# Patient Record
Sex: Female | Born: 1970
Health system: Southern US, Community
[De-identification: ages and names within clinical notes are randomized; demographics above are authoritative.]

## PROBLEM LIST (undated history)

## (undated) DIAGNOSIS — I10 Essential (primary) hypertension: Secondary | ICD-10-CM

## (undated) DIAGNOSIS — G43909 Migraine, unspecified, not intractable, without status migrainosus: Secondary | ICD-10-CM

## (undated) DIAGNOSIS — R112 Nausea with vomiting, unspecified: Secondary | ICD-10-CM

## (undated) DIAGNOSIS — K760 Fatty (change of) liver, not elsewhere classified: Secondary | ICD-10-CM

## (undated) DIAGNOSIS — Z9889 Other specified postprocedural states: Secondary | ICD-10-CM

## (undated) DIAGNOSIS — K219 Gastro-esophageal reflux disease without esophagitis: Secondary | ICD-10-CM

## (undated) DIAGNOSIS — M6258 Muscle wasting and atrophy, not elsewhere classified, other site: Secondary | ICD-10-CM

## (undated) DIAGNOSIS — F32A Depression, unspecified: Secondary | ICD-10-CM

## (undated) DIAGNOSIS — F419 Anxiety disorder, unspecified: Secondary | ICD-10-CM

## (undated) HISTORY — DX: Migraine, unspecified, not intractable, without status migrainosus: G43.909

## (undated) HISTORY — DX: Anxiety disorder, unspecified: F41.9

## (undated) HISTORY — DX: Essential (primary) hypertension: I10

## (undated) HISTORY — PX: CARDIAC CATHETERIZATION: SHX172

## (undated) HISTORY — DX: Depression, unspecified: F32.A

## (undated) HISTORY — DX: Gastro-esophageal reflux disease without esophagitis: K21.9

---

## 2003-11-10 HISTORY — PX: ANKLE FRACTURE SURGERY: SHX122

## 2006-11-09 HISTORY — PX: GASTRIC BYPASS: SHX52

## 2018-11-11 ENCOUNTER — Other Ambulatory Visit: Payer: Self-pay

## 2018-11-11 DIAGNOSIS — M25532 Pain in left wrist: Secondary | ICD-10-CM | POA: Insufficient documentation

## 2018-11-11 NOTE — ED Triage Notes (Signed)
Pt fell today states unsure why, states has been drinking. Pt co left wrist pain, no other complaints.

## 2018-11-12 ENCOUNTER — Emergency Department: Payer: 59

## 2018-11-12 ENCOUNTER — Emergency Department
Admission: EM | Admit: 2018-11-12 | Discharge: 2018-11-12 | Discharge status: Against Medical Advice | Payer: 59 | Attending: Emergency Medicine | Admitting: Emergency Medicine

## 2018-11-12 ENCOUNTER — Emergency Department
Admission: EM | Admit: 2018-11-12 | Discharge: 2018-11-12 | Disposition: A | Discharge status: Home / Self Care | Payer: 59 | Source: Home / Self Care | Attending: Emergency Medicine | Admitting: Emergency Medicine

## 2018-11-12 ENCOUNTER — Encounter: Payer: Self-pay | Admitting: Emergency Medicine

## 2018-11-12 DIAGNOSIS — Y998 Other external cause status: Secondary | ICD-10-CM

## 2018-11-12 DIAGNOSIS — S52502A Unspecified fracture of the lower end of left radius, initial encounter for closed fracture: Secondary | ICD-10-CM

## 2018-11-12 DIAGNOSIS — W010XXA Fall on same level from slipping, tripping and stumbling without subsequent striking against object, initial encounter: Secondary | ICD-10-CM

## 2018-11-12 DIAGNOSIS — Y9389 Activity, other specified: Secondary | ICD-10-CM

## 2018-11-12 DIAGNOSIS — Y929 Unspecified place or not applicable: Secondary | ICD-10-CM

## 2018-11-12 DIAGNOSIS — S62102A Fracture of unspecified carpal bone, left wrist, initial encounter for closed fracture: Secondary | ICD-10-CM

## 2018-11-12 MED ORDER — TRAMADOL HCL 50 MG PO TABS
50.0000 mg | ORAL_TABLET | Freq: Once | ORAL | Status: AC
  Administered 2018-11-12: 50 mg via ORAL
  Filled 2018-11-12: qty 1

## 2018-11-12 MED ORDER — IBUPROFEN 600 MG PO TABS
600.0000 mg | ORAL_TABLET | Freq: Once | ORAL | Status: AC
  Administered 2018-11-12: 600 mg via ORAL
  Filled 2018-11-12: qty 1

## 2018-11-12 MED ORDER — IBUPROFEN 600 MG PO TABS: 600.0000 mg | ORAL_TABLET | Freq: Three times a day (TID) | ORAL | 0 refills | Status: DC | PRN

## 2018-11-12 MED ORDER — TRAMADOL HCL 50 MG PO TABS: 50.0000 mg | ORAL_TABLET | Freq: Two times a day (BID) | ORAL | 0 refills | Status: DC | PRN

## 2018-11-12 NOTE — ED Notes (Signed)
Patient to stat desk asking about wait time. Patient given update on wait time. Patient verbalizes understanding.

## 2018-11-12 NOTE — ED Triage Notes (Signed)
L wrist pain. Fell yesterday. Came to be seen but had to leave due to long wait.

## 2018-11-12 NOTE — ED Provider Notes (Signed)
[facility] Emergency Department Provider Note   ____________________________________________   First MD Initiated Contact with Patient 11/12/18 1010     (approximate)  I have reviewed the triage vital signs and the nursing notes.   HISTORY  Chief Complaint Wrist Pain    HPI Monica Leon is a 48 y.o. female patient presents with left wrist pain secondary to break and fall yesterday.  Patient came to the ED but left after x-rays without definitive treatment secondary to long wait.  Patient denies loss of sensation.  Patient is right-hand dominant.  Patient rates pain as a 7/10.  Patient described pain is "achy".  No palliative measures prior to today's arrival.  History reviewed. No pertinent past medical history.  There are no active problems to display for this patient.   History reviewed. No pertinent surgical history.  Prior to Admission medications   Medication Sig Start Date End Date Taking? Authorizing Provider  ibuprofen (ADVIL,MOTRIN) 600 MG tablet Take 1 tablet (600 mg total) by mouth every 8 (eight) hours as needed. 11/12/18   Sable Feil, PA-C  traMADol (ULTRAM) 50 MG tablet Take 1 tablet (50 mg total) by mouth every 12 (twelve) hours as needed. 11/12/18   Sable Feil, PA-C    Allergies Patient has no known allergies.  No family history on file.  Social History Social History   Tobacco Use  . Smoking status: Not on file  Substance Use Topics  . Alcohol use: Not on file  . Drug use: Not on file    Review of Systems Constitutional: No fever/chills Eyes: No visual changes. ENT: No sore throat. Cardiovascular: Denies chest pain. Respiratory: Denies shortness of breath. Gastrointestinal: No abdominal pain.  No nausea, no vomiting.  No diarrhea.  No constipation. Genitourinary: Negative for dysuria. Musculoskeletal: Left wrist pain. Skin: Negative for rash. Neurological: Negative for headaches, focal weakness or  numbness.   ____________________________________________   PHYSICAL EXAM:  VITAL SIGNS: ED Triage Vitals  Enc Vitals Group     BP 11/12/18 0955 (!) 107/45     Pulse Rate 11/12/18 0955 89     Resp 11/12/18 0955 18     Temp 11/12/18 0955 98.9 F (37.2 C)     Temp Source 11/12/18 0955 Oral     SpO2 11/12/18 0955 100 %     Weight --      Height --      Head Circumference --      Peak Flow --      Pain Score 11/12/18 0956 7     Pain Loc --      Pain Edu? --      Excl. in [place]? --    Constitutional: Alert and oriented. Well appearing and in no acute distress. Hematological/Lymphatic/Immunilogical: No cervical lymphadenopathy. Cardiovascular: Normal rate, regular rhythm. Grossly normal heart sounds.  Good peripheral circulation. Respiratory: Normal respiratory effort.  No retractions. Lungs CTAB. Musculoskeletal: No obvious deformity to the left wrist.  Mild edema to the distal radius. Neurologic:  Normal speech and language. No gross focal neurologic deficits are appreciated. No gait instability. Skin:  Skin is warm, dry and intact. No rash noted. Psychiatric: Mood and affect are normal. Speech and behavior are normal.  ____________________________________________   LABS (all labs ordered are listed, but only abnormal results are displayed)  Labs Reviewed - No data to display ____________________________________________  EKG   ____________________________________________  RADIOLOGY  ED MD interpretation:    Official radiology report(s): Dg Wrist  Complete Left  Result Date: 11/12/2018 CLINICAL DATA:  Status post fall, with left wrist pain and swelling. Initial encounter. EXAM: LEFT WRIST - COMPLETE 3+ VIEW COMPARISON:  None. FINDINGS: There is a mildly comminuted horizontal fracture through the distal radial metaphysis, with mild dorsal displacement but no significant angulation. The carpal rows appear grossly intact, and demonstrate normal alignment. Diffuse soft tissue  swelling is noted about the wrist. IMPRESSION: Mildly comminuted horizontal fracture through the distal radial metaphysis, with mild dorsal displacement but no significant angulation. Electronically Signed   By: Garald Balding M.D.   On: 11/12/2018 00:31    ____________________________________________   PROCEDURES  Procedure(s) performed: None  .Splint Application Date/Time: 05/13/6432 10:21 AM Performed by: Laban Emperor, NT Authorized by: Sable Feil, PA-C   Consent:    Consent obtained:  Verbal   Consent given by:  Patient   Risks discussed:  Numbness, swelling and pain Pre-procedure details:    Sensation:  Normal Procedure details:    Laterality:  Left   Location:  Wrist   Wrist:  L wrist   Supplies:  Ortho-Glass and cotton padding Post-procedure details:    Pain:  Unchanged   Sensation:  Normal   Patient tolerance of procedure:  Tolerated well, no immediate complications    Critical Care performed: No  ____________________________________________   INITIAL IMPRESSION / ASSESSMENT AND PLAN / ED COURSE  As part of my medical decision making, I reviewed the following data within the electronic MEDICAL RECORD NUMBER    Left wrist pain secondary to distal radius fracture.  Discussed x-ray findings with patient.  Patient placed in a splint.  Patient advised to follow orthopedics by calling for an appointment in 2 days.  Follow discharge care instruction take medication as directed.      ____________________________________________   FINAL CLINICAL IMPRESSION(S) / ED DIAGNOSES  Final diagnoses:  Closed fracture of left wrist, initial encounter     ED Discharge Orders         Ordered    traMADol (ULTRAM) 50 MG tablet  Every 12 hours PRN     11/12/18 1017    ibuprofen (ADVIL,MOTRIN) 600 MG tablet  Every 8 hours PRN     11/12/18 1017           Note:  This document was prepared using Dragon voice recognition software and may include unintentional  dictation errors.    Sable Feil, PA-C 11/12/18 1025    Arta Silence, MD 11/12/18 [phone]

## 2018-11-12 NOTE — Discharge Instructions (Addendum)
Wear wrist splint until evaluation by orthopedics.  Call Monday morning at 830 and advised him to follow-up in the emergency room.

## 2021-04-02 DIAGNOSIS — Z20822 Contact with and (suspected) exposure to covid-19: Secondary | ICD-10-CM | POA: Diagnosis not present

## 2021-06-25 DIAGNOSIS — A084 Viral intestinal infection, unspecified: Secondary | ICD-10-CM | POA: Diagnosis not present

## 2021-06-25 DIAGNOSIS — Z03818 Encounter for observation for suspected exposure to other biological agents ruled out: Secondary | ICD-10-CM | POA: Diagnosis not present

## 2021-08-18 ENCOUNTER — Other Ambulatory Visit: Payer: Self-pay

## 2021-08-18 ENCOUNTER — Encounter: Payer: Self-pay | Admitting: Internal Medicine

## 2021-08-18 ENCOUNTER — Ambulatory Visit (INDEPENDENT_AMBULATORY_CARE_PROVIDER_SITE_OTHER): Payer: BC Managed Care – PPO | Admitting: Internal Medicine

## 2021-08-18 VITALS — BP 91/48 | HR 71 | Temp 97.7°F | Resp 18 | Ht 64.0 in | Wt 225.6 lb

## 2021-08-18 DIAGNOSIS — K219 Gastro-esophageal reflux disease without esophagitis: Secondary | ICD-10-CM

## 2021-08-18 DIAGNOSIS — I10 Essential (primary) hypertension: Secondary | ICD-10-CM | POA: Diagnosis not present

## 2021-08-18 DIAGNOSIS — F419 Anxiety disorder, unspecified: Secondary | ICD-10-CM | POA: Diagnosis not present

## 2021-08-18 DIAGNOSIS — Z23 Encounter for immunization: Secondary | ICD-10-CM

## 2021-08-18 DIAGNOSIS — Z6838 Body mass index (BMI) 38.0-38.9, adult: Secondary | ICD-10-CM

## 2021-08-18 DIAGNOSIS — E6609 Other obesity due to excess calories: Secondary | ICD-10-CM | POA: Diagnosis not present

## 2021-08-18 DIAGNOSIS — F32A Depression, unspecified: Secondary | ICD-10-CM

## 2021-08-18 NOTE — Addendum Note (Signed)
Addended by: Wilson Singer on: 08/18/2021 02:53 PM   Modules accepted: Orders

## 2021-08-18 NOTE — Assessment & Plan Note (Signed)
Encouraged diet and exercise for weight loss ?

## 2021-08-18 NOTE — Assessment & Plan Note (Signed)
Try to avoid foods that trigger your reflux Encouraged weight loss as this can help reduce reflux symptoms Continue Omeprazole

## 2021-08-18 NOTE — Assessment & Plan Note (Signed)
Controlled on Lisinopril Reinforce DASH diet and exercise for weight loss Will monitor

## 2021-08-18 NOTE — Patient Instructions (Signed)
Health Maintenance for Postmenopausal Women Menopause is a normal process in which your ability to get pregnant comes to an end. This process happens slowly over many months or years, usually between the ages of 81 and 79. Menopause is complete when you have missed your menstrual periods for 12 months. It is important to talk with your health care provider about some of the most common conditions that affect women after menopause (postmenopausal women). These include heart disease, cancer, and bone loss (osteoporosis). Adopting a healthy lifestyle and getting preventive care can help to promote your health and wellness. The actions you take can also lower your chances of developing some of these common conditions. What should I know about menopause? During menopause, you may get a number of symptoms, such as: Hot flashes. These can be moderate or severe. Night sweats. Decrease in sex drive. Mood swings. Headaches. Tiredness. Irritability. Memory problems. Insomnia. Choosing to treat or not to treat these symptoms is a decision that you make with your health care provider. Do I need hormone replacement therapy? Hormone replacement therapy is effective in treating symptoms that are caused by menopause, such as hot flashes and night sweats. Hormone replacement carries certain risks, especially as you become older. If you are thinking about using estrogen or estrogen with progestin, discuss the benefits and risks with your health care provider. What is my risk for heart disease and stroke? The risk of heart disease, heart attack, and stroke increases as you age. One of the causes may be a change in the body's hormones during menopause. This can affect how your body uses dietary fats, triglycerides, and cholesterol. Heart attack and stroke are medical emergencies. There are many things that you can do to help prevent heart disease and stroke. Watch your blood pressure High blood pressure causes heart  disease and increases the risk of stroke. This is more likely to develop in people who have high blood pressure readings, are of African descent, or are overweight. Have your blood pressure checked: Every 3-5 years if you are 51-40 years of age. Every year if you are 73 years old or older. Eat a healthy diet  Eat a diet that includes plenty of vegetables, fruits, low-fat dairy products, and lean protein. Do not eat a lot of foods that are high in solid fats, added sugars, or sodium. Get regular exercise Get regular exercise. This is one of the most important things you can do for your health. Most adults should: Try to exercise for at least 150 minutes each week. The exercise should increase your heart rate and make you sweat (moderate-intensity exercise). Try to do strengthening exercises at least twice each week. Do these in addition to the moderate-intensity exercise. Spend less time sitting. Even light physical activity can be beneficial. Other tips Work with your health care provider to achieve or maintain a healthy weight. Do not use any products that contain nicotine or tobacco, such as cigarettes, e-cigarettes, and chewing tobacco. If you need help quitting, ask your health care provider. Know your numbers. Ask your health care provider to check your cholesterol and your blood sugar (glucose). Continue to have your blood tested as directed by your health care provider. Do I need screening for cancer? Depending on your health history and family history, you may need to have cancer screening at different stages of your life. This may include screening for: Breast cancer. Cervical cancer. Lung cancer. Colorectal cancer. What is my risk for osteoporosis? After menopause, you may be  at increased risk for osteoporosis. Osteoporosis is a condition in which bone destruction happens more quickly than new bone creation. To help prevent osteoporosis or the bone fractures that can happen because  of osteoporosis, you may take the following actions: If you are 8-63 years old, get at least 1,000 mg of calcium and at least 600 mg of vitamin D per day. If you are older than age 36 but younger than age 61, get at least 1,200 mg of calcium and at least 600 mg of vitamin D per day. If you are older than age 73, get at least 1,200 mg of calcium and at least 800 mg of vitamin D per day. Smoking and drinking excessive alcohol increase the risk of osteoporosis. Eat foods that are rich in calcium and vitamin D, and do weight-bearing exercises several times each week as directed by your health care provider. How does menopause affect my mental health? Depression may occur at any age, but it is more common as you become older. Common symptoms of depression include: Low or sad mood. Changes in sleep patterns. Changes in appetite or eating patterns. Feeling an overall lack of motivation or enjoyment of activities that you previously enjoyed. Frequent crying spells. Talk with your health care provider if you think that you are experiencing depression. General instructions See your health care provider for regular wellness exams and vaccines. This may include: Scheduling regular health, dental, and eye exams. Getting and maintaining your vaccines. These include: Influenza vaccine. Get this vaccine each year before the flu season begins. Pneumonia vaccine. Shingles vaccine. Tetanus, diphtheria, and pertussis (Tdap) booster vaccine. Your health care provider may also recommend other immunizations. Tell your health care provider if you have ever been abused or do not feel safe at home. Summary Menopause is a normal process in which your ability to get pregnant comes to an end. This condition causes hot flashes, night sweats, decreased interest in sex, mood swings, headaches, or lack of sleep. Treatment for this condition may include hormone replacement therapy. Take actions to keep yourself healthy,  including exercising regularly, eating a healthy diet, watching your weight, and checking your blood pressure and blood sugar levels. Get screened for cancer and depression. Make sure that you are up to date with all your vaccines. This information is not intended to replace advice given to you by your health care provider. Make sure you discuss any questions you have with your health care provider. Document Revised: 10/19/2018 Document Reviewed: 10/19/2018 Elsevier Patient Education  2022 [employer].

## 2021-08-18 NOTE — Assessment & Plan Note (Signed)
Stable on Escitalopram Support offered

## 2021-08-18 NOTE — Progress Notes (Signed)
HPI  Pt presents to the clinic today to establish care and for management of the conditions listed below.   HTN: Her BP today is 91/48. She is taking Lisinopril as prescribed. There is no ECG on file.  Anxiety and Depression: Chronic, managed on Escitalopram. She is not currently seeing a therapist. She denies SI/HI.  GERD: Triggered by acidic foods. She denies breakthrough on Omeprazole. There is no upper GI on file.   Past Medical History:  Diagnosis Date   Anxiety    Depression    GERD (gastroesophageal reflux disease)    Hypertension     Current Outpatient Medications  Medication Sig Dispense Refill   Black Cohosh-SoyIsoflav-Magnol (ESTROVEN MENOPAUSE RELIEF) CAPS Take by mouth.     escitalopram (LEXAPRO) 20 MG tablet Take 20 mg by mouth daily.     lisinopril (ZESTRIL) 10 MG tablet Take 10 mg by mouth daily.     Multiple Vitamin (MULTIVITAMIN) tablet Take 1 tablet by mouth daily.     omeprazole (PRILOSEC) 20 MG capsule Take 20 mg by mouth daily.     POTASSIUM PO Take by mouth.     No current facility-administered medications for this visit.    No Known Allergies  No family history on file.  Social History   Socioeconomic History   Marital status: Single    Spouse name: Not on file   Number of children: Not on file   Years of education: Not on file   Highest education level: Not on file  Occupational History   Not on file  Tobacco Use   Smoking status: Never   Smokeless tobacco: Never  Vaping Use   Vaping Use: Never used  Substance and Sexual Activity   Alcohol use: Yes    Alcohol/week: 6.0 standard drinks    Types: 6 Standard drinks or equivalent per week   Drug use: Never   Sexual activity: Not on file  Other Topics Concern   Not on file  Social History Narrative   Not on file   Social Determinants of Health   Financial Resource Strain: Not on file  Food Insecurity: Not on file  Transportation Needs: Not on file  Physical Activity: Not on file   Stress: Not on file  Social Connections: Not on file  Intimate Partner Violence: Not on file    ROS:  Constitutional: Denies fever, malaise, fatigue, headache or abrupt weight changes.  HEENT: Pt reports post nasal drip. Denies eye pain, eye redness, ear pain, ringing in the ears, wax buildup, runny nose, nasal congestion, bloody nose, or sore throat. Respiratory: Denies difficulty breathing, shortness of breath, cough or sputum production.   Cardiovascular: Denies chest pain, chest tightness, palpitations or swelling in the hands or feet.  Gastrointestinal: Denies abdominal pain, bloating, constipation, diarrhea or blood in the stool.  GU: Denies frequency, urgency, pain with urination, blood in urine, odor or discharge. Musculoskeletal: Denies decrease in range of motion, difficulty with gait, muscle pain or joint pain and swelling.  Skin: Denies redness, rashes, lesions or ulcercations.  Neurological: Denies dizziness, difficulty with memory, difficulty with speech or problems with balance and coordination.  Psych: Pt has a history of anxiety and depression. Denies SI/HI.  No other specific complaints in a complete review of systems (except as listed in HPI above).  PE:  BP (!) 91/48 (BP Location: Right Arm, Patient Position: Sitting, Cuff Size: Large)   Pulse 71   Temp 97.7 F (36.5 C) (Temporal)   Resp 18  Ht 5' 4"  (1.626 m)   Wt 225 lb 9.6 oz (102.3 kg)   SpO2 99%   BMI 38.72 kg/m  Wt Readings from Last 3 Encounters:  08/18/21 225 lb 9.6 oz (102.3 kg)  11/11/18 190 lb (86.2 kg)    General: Appears her stated age, obese, in NAD. Skin: Dry and intact. HEENT: Head: normal shape and size; Eyes: sclera white and EOMs intact;  Cardiovascular: Normal rate and rhythm. S1,S2 noted.  No murmur, rubs or gallops noted. No JVD or BLE edema.  Pulmonary/Chest: Normal effort and positive vesicular breath sounds. No respiratory distress. No wheezes, rales or ronchi noted.   Neurological: Alert and oriented.  Psychiatric: Mood and affect normal. Behavior is normal. Judgment and thought content normal.     Assessment and Plan:  Post Nasal Drip:  Try Zyrtec 10 mg daily  RTC in 1 month for your annual exam Webb Silversmith, NP This visit occurred during the SARS-CoV-2 public health emergency.  Safety protocols were in place, including screening questions prior to the visit, additional usage of staff PPE, and extensive cleaning of exam room while observing appropriate contact time as indicated for disinfecting solutions.

## 2021-09-19 ENCOUNTER — Encounter: Payer: BC Managed Care – PPO | Admitting: Internal Medicine

## 2021-09-26 ENCOUNTER — Encounter: Payer: BC Managed Care – PPO | Admitting: Internal Medicine

## 2021-11-06 ENCOUNTER — Telehealth: Payer: Self-pay

## 2021-11-06 DIAGNOSIS — Z20822 Contact with and (suspected) exposure to covid-19: Secondary | ICD-10-CM | POA: Diagnosis not present

## 2021-11-06 NOTE — Telephone Encounter (Signed)
When did symptoms start. Can come to appt is > 5 days since symptoms onset and fever free for 24 hours without medication to lower fever. It may be best just to reschedule.

## 2021-11-06 NOTE — Telephone Encounter (Signed)
Copied from [place] [phone]. Topic: General - Other >> Nov 06, 2021  1:10 PM Leward Quan A wrote: Reason for CRM: Patient called in to say that she have tested  Covid positive need to know if this will effect her appointment on 11/11/21. Please call Ph# [phone]

## 2021-11-07 NOTE — Telephone Encounter (Signed)
I attempted to contact the patient, no answer. I left a detail message on her voicemail.

## 2021-11-09 DIAGNOSIS — R634 Abnormal weight loss: Secondary | ICD-10-CM

## 2021-11-09 HISTORY — DX: Abnormal weight loss: R63.4

## 2021-11-11 ENCOUNTER — Encounter: Payer: BC Managed Care – PPO | Admitting: Internal Medicine

## 2021-11-27 ENCOUNTER — Encounter: Payer: BC Managed Care – PPO | Admitting: Internal Medicine

## 2021-12-22 ENCOUNTER — Ambulatory Visit: Payer: Self-pay | Admitting: *Deleted

## 2021-12-22 NOTE — Telephone Encounter (Signed)
Summary: advice- lack of energy / appetite   Pt stated the past few days she has not felt well, has a lack of energy and lack of appetite, pt stated her weight has changed unexpectedly, pt needed advice.       Called patient to review symptoms of lack of energy / change in appetite and weight change. No answer, LVMTCB [phone].

## 2021-12-22 NOTE — Telephone Encounter (Signed)
Will discuss at upcoming appt tomorrow

## 2021-12-22 NOTE — Telephone Encounter (Signed)
°  Chief Complaint: Nausea, fatigue, diarrhea Symptoms: nausea, diarrhea, fatigue Frequency: ongoing for months Pertinent Negatives: Patient denies Fever Disposition: [] ED /[] Urgent Care (no appt availability in office) / [] Appointment(In office/virtual)/ []  [facility]/ [] Home Care/ [] Refused Recommended Disposition /[] [facility]/ [x]  Follow-up with PCP Additional Notes: Pt has just not been feeling well for months. No appetite. Nausea, no energy.  Appointment made for tomorrow.   Pt stated the past few days she has not felt well, has a lack of energy and lack of appetite, pt stated her weight has changed unexpectedly, pt needed advice.             Reason for Disposition  [1] MODERATE weakness (i.e., interferes with work, school, normal activities) AND [2] persists > 3 days  Answer Assessment - Initial Assessment Questions 1. DESCRIPTION: "Describe how you are feeling."     Very tired, nauseous 2. SEVERITY: "How bad is it?"  "Can you stand and walk?"   - MILD - Feels weak or tired, but does not interfere with work, school or normal activities   - [place] to stand and walk; weakness interferes with work, school, or normal activities   - SEVERE - Unable to stand or walk     moderate 3. ONSET:  "When did the weakness begin?"     months 4. CAUSE: "What do you think is causing the weakness?"     unsure 5. MEDICINES: "Have you recently started a new medicine or had a change in the amount of a medicine?"     no 6. OTHER SYMPTOMS: "Do you have any other symptoms?" (e.g., chest pain, fever, cough, SOB, vomiting, diarrhea, bleeding, other areas of pain)     Tested for COVID - has sniffles 7. PREGNANCY: "Is there any chance you are pregnant?" "When was your last menstrual period?"     no  Protocols used: Weakness (Generalized) and Fatigue-A-AH

## 2021-12-23 ENCOUNTER — Ambulatory Visit (INDEPENDENT_AMBULATORY_CARE_PROVIDER_SITE_OTHER): Payer: BC Managed Care – PPO | Admitting: Internal Medicine

## 2021-12-23 ENCOUNTER — Ambulatory Visit: Payer: BC Managed Care – PPO | Admitting: Internal Medicine

## 2021-12-23 ENCOUNTER — Ambulatory Visit
Admission: RE | Admit: 2021-12-23 | Discharge: 2021-12-23 | Disposition: A | Discharge status: Home / Self Care | Payer: BC Managed Care – PPO | Source: Ambulatory Visit | Attending: Internal Medicine | Admitting: Internal Medicine

## 2021-12-23 ENCOUNTER — Encounter: Payer: Self-pay | Admitting: Internal Medicine

## 2021-12-23 ENCOUNTER — Ambulatory Visit
Admission: RE | Admit: 2021-12-23 | Discharge: 2021-12-23 | Disposition: A | Discharge status: Home / Self Care | Payer: BC Managed Care – PPO | Attending: Internal Medicine | Admitting: Internal Medicine

## 2021-12-23 ENCOUNTER — Other Ambulatory Visit: Payer: Self-pay

## 2021-12-23 VITALS — BP 112/75 | HR 87 | Resp 15 | Ht 64.0 in | Wt 200.0 lb

## 2021-12-23 DIAGNOSIS — R110 Nausea: Secondary | ICD-10-CM | POA: Diagnosis not present

## 2021-12-23 DIAGNOSIS — R202 Paresthesia of skin: Secondary | ICD-10-CM | POA: Diagnosis not present

## 2021-12-23 DIAGNOSIS — R051 Acute cough: Secondary | ICD-10-CM | POA: Insufficient documentation

## 2021-12-23 DIAGNOSIS — J029 Acute pharyngitis, unspecified: Secondary | ICD-10-CM

## 2021-12-23 DIAGNOSIS — R630 Anorexia: Secondary | ICD-10-CM

## 2021-12-23 DIAGNOSIS — E559 Vitamin D deficiency, unspecified: Secondary | ICD-10-CM | POA: Diagnosis not present

## 2021-12-23 DIAGNOSIS — R0602 Shortness of breath: Secondary | ICD-10-CM | POA: Diagnosis not present

## 2021-12-23 DIAGNOSIS — R1110 Vomiting, unspecified: Secondary | ICD-10-CM

## 2021-12-23 DIAGNOSIS — R0981 Nasal congestion: Secondary | ICD-10-CM

## 2021-12-23 DIAGNOSIS — R059 Cough, unspecified: Secondary | ICD-10-CM | POA: Diagnosis not present

## 2021-12-23 DIAGNOSIS — R195 Other fecal abnormalities: Secondary | ICD-10-CM

## 2021-12-23 MED ORDER — ONDANSETRON 4 MG PO TBDP: 4.0000 mg | ORAL_TABLET | Freq: Three times a day (TID) | ORAL | 0 refills | Status: DC | PRN

## 2021-12-23 MED ORDER — OMEPRAZOLE 40 MG PO CPDR: 40.0000 mg | DELAYED_RELEASE_CAPSULE | Freq: Every day | ORAL | 2 refills | Status: DC

## 2021-12-23 MED ORDER — PROMETHAZINE-DM 6.25-15 MG/5ML PO SYRP: 5.0000 mL | ORAL_SOLUTION | Freq: Every evening | ORAL | 0 refills | Status: DC | PRN

## 2021-12-23 NOTE — Progress Notes (Signed)
Subjective:    Patient ID: Monica Leon, female    DOB: October 11, 1971, 51 y.o.   MRN: [id number]  HPI  Patient presents to clinic today with complaint of cough.  She reports this started 1 week ago.  The cough is productive at times.  It is keeping her up at night.  She reports associated slight headache, nasal congestion, scratchy throat and shortness of breath.  She also reports dry heaving 3 x day, triggered by mucous, smells. She feels nauseated.  She has tried medications OTC with minimal relief of symptoms.  She also reports loose stools.  She had gastric bypass 15 years.  She has recently noticed a lack of appetite.  She does have frequent nausea.  She is taking Prilosec daily.  She does not feel overly anxious or depressed at this time.   Review of Systems     Past Medical History:  Diagnosis Date   Anxiety    Depression    GERD (gastroesophageal reflux disease)    Hypertension    Migraine     Current Outpatient Medications  Medication Sig Dispense Refill   Black Cohosh-SoyIsoflav-Magnol (ESTROVEN MENOPAUSE RELIEF) CAPS Take by mouth.     escitalopram (LEXAPRO) 20 MG tablet Take 20 mg by mouth daily.     lisinopril (ZESTRIL) 10 MG tablet Take 10 mg by mouth daily.     Multiple Vitamin (MULTIVITAMIN) tablet Take 1 tablet by mouth daily.     omeprazole (PRILOSEC) 20 MG capsule Take 20 mg by mouth daily.     POTASSIUM PO Take by mouth.     No current facility-administered medications for this visit.    No Known Allergies  No family history on file.  Social History   Socioeconomic History   Marital status: Single    Spouse name: Not on file   Number of children: Not on file   Years of education: Not on file   Highest education level: Not on file  Occupational History   Not on file  Tobacco Use   Smoking status: Never   Smokeless tobacco: Never  Vaping Use   Vaping Use: Never used  Substance and Sexual Activity   Alcohol use: Yes    Alcohol/week: 6.0  standard drinks    Types: 6 Standard drinks or equivalent per week   Drug use: Never   Sexual activity: Not on file  Other Topics Concern   Not on file  Social History Narrative   Not on file   Social Determinants of Health   Financial Resource Strain: Not on file  Food Insecurity: Not on file  Transportation Needs: Not on file  Physical Activity: Not on file  Stress: Not on file  Social Connections: Not on file  Intimate Partner Violence: Not on file     Constitutional: Patient reports headache.  Denies fever, malaise, fatigue, or abrupt weight changes.  HEENT: Patient reports nasal congestion, sore throat.  Denies eye pain, eye redness, ear pain, ringing in the ears, wax buildup, runny nose, bloody nose. Respiratory: Patient reports cough and shortness of breath.  Denies difficulty breathing.   Cardiovascular: Denies chest pain, chest tightness, palpitations or swelling in the hands or feet.  Gastrointestinal: Patient reports nausea, dry heaving, poor appetite and loose stools.  Denies abdominal pain, bloating, constipation, or blood in the stool.  Neurological: Patient reports numbness and tingling of her left lower extremity.  Denies dizziness, difficulty with memory, difficulty with speech or problems with balance and coordination.  Psych: Patient has a history of anxiety and depression.  Denies  SI/HI.  No other specific complaints in a complete review of systems (except as listed in HPI above).  Objective:   Physical Exam  BP 112/75 (BP Location: Left Arm, Patient Position: Sitting, Cuff Size: Normal)    Pulse 87    Resp 15    Ht 5' 4"  (1.626 m)    Wt 200 lb (90.7 kg)    SpO2 95%    BMI 34.33 kg/m   Wt Readings from Last 3 Encounters:  08/18/21 225 lb 9.6 oz (102.3 kg)  11/11/18 190 lb (86.2 kg)    General: Appears her stated age, obese, in NAD. Skin: Warm, dry and intact. No rashes noted. HEENT: Head: normal shape and size; Eyes: sclera white and EOMs intact; Ears:  Tm's gray and intact, normal light reflex; Throat/Mouth: Teeth present, mucosa pink and moist, + PND, no exudate, lesions or ulcerations noted.  Neck: No adenopathy noted. Cardiovascular: Normal rate and rhythm.  Pulmonary/Chest: Normal effort and positive vesicular breath sounds. No respiratory distress. No wheezes, rales or ronchi noted.  Abdomen: Soft and nontender. Normal bowel sounds.  Musculoskeletal: No difficulty with gait.  Neurological: Alert and oriented.  Sensation intact to LLE. Psychiatric: Mood and affect normal. Behavior is normal. Judgment and thought content normal.        Assessment & Plan:   Paresthesia of Left Lower Extremity:  We will check TSH, A1c, vitamin D and B12  Cough, Nasal Congestion, Sore Throat and Shortness of Breath:  Chest x-ray today CBC today Rx for Promethazine DM cough syrup  Nausea, Dry Heaves, loose Stools and Poor Appetite:  C-Met today Increase Prilosec to 40 mg daily Rx for Zofran ODT every 8 hours as needed  We will follow-up after labs with further recommendation and treatment plan Webb Silversmith, NP This visit occurred during the SARS-CoV-2 public health emergency.  Safety protocols were in place, including screening questions prior to the visit, additional usage of staff PPE, and extensive cleaning of exam room while observing appropriate contact time as indicated for disinfecting solutions.

## 2021-12-24 ENCOUNTER — Telehealth: Payer: Self-pay

## 2021-12-24 DIAGNOSIS — R748 Abnormal levels of other serum enzymes: Secondary | ICD-10-CM

## 2021-12-24 LAB — COMPLETE METABOLIC PANEL WITH GFR
AG Ratio: 1.3 (calc) (ref 1.0–2.5)
ALT: 110 U/L — ABNORMAL HIGH (ref 6–29)
AST: 139 U/L — ABNORMAL HIGH (ref 10–35)
Albumin: 3.6 g/dL (ref 3.6–5.1)
Alkaline phosphatase (APISO): 110 U/L (ref 37–153)
BUN/Creatinine Ratio: 7 (calc) (ref 6–22)
BUN: 5 mg/dL — ABNORMAL LOW (ref 7–25)
CO2: 22 mmol/L (ref 20–32)
Calcium: 9.3 mg/dL (ref 8.6–10.4)
Chloride: 101 mmol/L (ref 98–110)
Creat: 0.76 mg/dL (ref 0.50–1.03)
Globulin: 2.7 g/dL (calc) (ref 1.9–3.7)
Glucose, Bld: 86 mg/dL (ref 65–139)
Potassium: 5.6 mmol/L — ABNORMAL HIGH (ref 3.5–5.3)
Sodium: 135 mmol/L (ref 135–146)
Total Bilirubin: 1.4 mg/dL — ABNORMAL HIGH (ref 0.2–1.2)
Total Protein: 6.3 g/dL (ref 6.1–8.1)
eGFR: 95 mL/min/{1.73_m2} (ref >=60)

## 2021-12-24 LAB — CBC
HCT: 42.1 % (ref 35.0–45.0)
Hemoglobin: 14.7 g/dL (ref 11.7–15.5)
MCH: 37.1 pg — ABNORMAL HIGH (ref 27.0–33.0)
MCHC: 34.9 g/dL (ref 32.0–36.0)
MCV: 106.3 fL — ABNORMAL HIGH (ref 80.0–100.0)
MPV: 10.7 fL (ref 7.5–12.5)
Platelets: 204 10*3/uL (ref 140–400)
RBC: 3.96 10*6/uL (ref 3.80–5.10)
RDW: 11.1 % (ref 11.0–15.0)
WBC: 5.6 10*3/uL (ref 3.8–10.8)

## 2021-12-24 LAB — HEMOGLOBIN A1C
Hgb A1c MFr Bld: 4.7 % of total Hgb (ref <5.7)
Mean Plasma Glucose: 88 mg/dL
eAG (mmol/L): 4.9 mmol/L

## 2021-12-24 LAB — VITAMIN B12: Vitamin B-12: 402 pg/mL (ref 200–1100)

## 2021-12-24 LAB — TSH: TSH: 0.65 mIU/L

## 2021-12-24 LAB — VITAMIN D 25 HYDROXY (VIT D DEFICIENCY, FRACTURES): Vit D, 25-Hydroxy: 18 ng/mL — ABNORMAL LOW (ref 30–100)

## 2021-12-24 MED ORDER — VITAMIN D (ERGOCALCIFEROL) 1.25 MG (50000 UNIT) PO CAPS: 50000.0000 [IU] | ORAL_CAPSULE | ORAL | 0 refills | Status: DC

## 2021-12-24 NOTE — Telephone Encounter (Signed)
Ultrasound ordered

## 2021-12-24 NOTE — Telephone Encounter (Signed)
Pt advised.  Vitamin D sent to [facility].  Pt agreed to the [place]     Thanks,   -Mickel Baas

## 2021-12-24 NOTE — Telephone Encounter (Signed)
-----   Message from Jearld Fenton, NP sent at 12/24/2021  6:48 AM EST ----- Blood counts are normal.  Her potassium is elevated.  If she is taking potassium supplement, she should stop this.  Liver function is elevated.  I think we should get an ultrasound of her liver for further evaluation.  Let me know if she is agreeable and I will order this.  Kidney function is normal.  Vitamin D is low, send in ergocalciferol 50,000 units weekly x12 weeks, 0 refills.  Thyroid and B12 are normal.  No diabetes.

## 2021-12-25 ENCOUNTER — Other Ambulatory Visit: Payer: Self-pay

## 2021-12-25 MED ORDER — ESOMEPRAZOLE MAGNESIUM 40 MG PO CPDR: 40.0000 mg | DELAYED_RELEASE_CAPSULE | Freq: Every day | ORAL | 1 refills | Status: DC

## 2021-12-25 NOTE — Patient Instructions (Signed)
Diarrhea, Adult Diarrhea is when you pass loose and watery poop (stool) often. Diarrhea can make you feel weak and cause you to lose water in your body (get dehydrated). Losing water in your body can cause you to: Feel tired and thirsty. Have a dry mouth. Go pee (urinate) less often. Diarrhea often lasts 2-3 days. However, it can last longer if it is a sign of something more serious. It is important to treat your diarrhea as told by your doctor. Follow these instructions at home: Eating and drinking   Follow these instructions as told by your doctor: Take an ORS (oral rehydration solution). This is a drink that helps you replace fluids and minerals your body lost. It is sold at pharmacies and stores. Drink plenty of fluids, such as: Water. Ice chips. Diluted fruit juice. Low-calorie sports drinks. Milk, if you want. Avoid drinking fluids that have a lot of sugar or caffeine in them. Eat bland, easy-to-digest foods in small amounts as you are able. These foods include: Bananas. Applesauce. Rice. Low-fat (lean) meats. Toast. Crackers. Avoid alcohol. Avoid spicy or fatty foods.  Medicines Take over-the-counter and prescription medicines only as told by your doctor. If you were prescribed an antibiotic medicine, take it as told by your doctor. Do not stop using the antibiotic even if you start to feel better. General instructions  Wash your hands often using soap and water. If soap and water are not available, use a hand sanitizer. Others in your home should wash their hands as well. Hands should be washed: After using the toilet or changing a diaper. Before preparing, cooking, or serving food. While caring for a sick person. While visiting someone in a hospital. Drink enough fluid to keep your pee (urine) pale yellow. Rest at home while you get better. Take a warm bath to help with any burning or pain from having diarrhea. Watch your condition for any changes. Keep all  follow-up visits as told by your doctor. This is important. Contact a doctor if: You have a fever. Your diarrhea gets worse. You have new symptoms. You cannot keep fluids down. You feel light-headed or dizzy. You have a headache. You have muscle cramps. Get help right away if: You have chest pain. You feel very weak or you pass out (faint). You have bloody or black poop or poop that looks like tar. You have very bad pain, cramping, or bloating in your belly (abdomen). You have trouble breathing or you are breathing very quickly. Your heart is beating very quickly. Your skin feels cold and clammy. You feel confused. You have signs of losing too much water in your body, such as: Dark pee, very little pee, or no pee. Cracked lips. Dry mouth. Sunken eyes. Sleepiness. Weakness. Summary Diarrhea is when you pass loose and watery poop (stool) often. Diarrhea can make you feel weak and cause you to lose water in your body (get dehydrated). Take an ORS (oral rehydration solution). This is a drink that is sold at pharmacies and stores. Eat bland, easy-to-digest foods in small amounts as you are able. Contact a doctor if your condition gets worse. Get help right away if you have signs that you have lost too much water in your body. This information is not intended to replace advice given to you by your health care provider. Make sure you discuss any questions you have with your health care provider. Document Revised: 05/07/2021 Document Reviewed: 05/07/2021 Elsevier Patient Education  [place].

## 2021-12-29 ENCOUNTER — Ambulatory Visit
Admission: RE | Admit: 2021-12-29 | Discharge: 2021-12-29 | Disposition: A | Discharge status: Home / Self Care | Payer: BC Managed Care – PPO | Source: Ambulatory Visit | Attending: Internal Medicine | Admitting: Internal Medicine

## 2021-12-29 ENCOUNTER — Other Ambulatory Visit: Payer: Self-pay

## 2021-12-29 DIAGNOSIS — R945 Abnormal results of liver function studies: Secondary | ICD-10-CM | POA: Diagnosis not present

## 2021-12-29 DIAGNOSIS — R748 Abnormal levels of other serum enzymes: Secondary | ICD-10-CM | POA: Diagnosis not present

## 2021-12-29 DIAGNOSIS — K760 Fatty (change of) liver, not elsewhere classified: Secondary | ICD-10-CM | POA: Diagnosis not present

## 2022-01-14 ENCOUNTER — Telehealth: Payer: Self-pay | Admitting: Internal Medicine

## 2022-01-14 NOTE — Telephone Encounter (Signed)
Pt is asking if PCP would like for her to continue taking medication Vitamin D, Ergocalciferol, (DRISDOL) 1.25 MG (50000 UNIT) CAPS capsule. ? ?Pt is requesting a call back.  ? ?

## 2022-01-15 MED ORDER — VITAMIN D (ERGOCALCIFEROL) 1.25 MG (50000 UNIT) PO CAPS: 50000.0000 [IU] | ORAL_CAPSULE | ORAL | 0 refills | Status: DC

## 2022-01-15 NOTE — Addendum Note (Signed)
Addended by: Kizzie Furnish on: 01/15/2022 08:43 AM ? ? Modules accepted: Orders ? ?

## 2022-01-15 NOTE — Telephone Encounter (Signed)
She should not have even finished her 12 weeks of therapy yet.  She should continue the ergocalciferol 50,000 weekly for 12 weeks and then we need to repeat vitamin D levels ?

## 2022-01-15 NOTE — Telephone Encounter (Signed)
Pt advised.  She states she only received 4 capsules from her pharmacy.  I sent in the additional 8 capsules to [facility] in [place]. ? ? ?Thanks,  ? ?-Mickel Baas ?

## 2022-01-22 ENCOUNTER — Other Ambulatory Visit: Payer: Self-pay

## 2022-01-22 ENCOUNTER — Encounter: Payer: Self-pay | Admitting: Internal Medicine

## 2022-01-22 ENCOUNTER — Ambulatory Visit (INDEPENDENT_AMBULATORY_CARE_PROVIDER_SITE_OTHER): Payer: BC Managed Care – PPO | Admitting: Internal Medicine

## 2022-01-22 VITALS — BP 114/66 | HR 84 | Temp 96.8°F | Wt 200.0 lb

## 2022-01-22 DIAGNOSIS — Z23 Encounter for immunization: Secondary | ICD-10-CM | POA: Diagnosis not present

## 2022-01-22 DIAGNOSIS — R052 Subacute cough: Secondary | ICD-10-CM

## 2022-01-22 MED ORDER — PREDNISONE 10 MG PO TABS: ORAL_TABLET | ORAL | 0 refills | Status: DC

## 2022-01-22 MED ORDER — OMEPRAZOLE 40 MG PO CPDR: 40.0000 mg | DELAYED_RELEASE_CAPSULE | Freq: Every day | ORAL | 1 refills | Status: DC

## 2022-01-22 MED ORDER — ALBUTEROL SULFATE HFA 108 (90 BASE) MCG/ACT IN AERS: 2.0000 | INHALATION_SPRAY | Freq: Four times a day (QID) | RESPIRATORY_TRACT | 0 refills | Status: DC | PRN

## 2022-01-22 NOTE — Patient Instructions (Signed)

## 2022-01-22 NOTE — Progress Notes (Signed)
? ?Subjective:  ? ? Patient ID: Monica Leon, female    DOB: [birth year], 51 y.o.   MRN: [id number] ? ?HPI ? ?Patient presents to clinic today with complaint of a persistent cough.  This started 5 weeks ago.  The cough is productive of white mucus.  She denies headache, runny nose, nasal congestion, ear pain, sore throat, shortness of breath, chest pain, nausea, vomiting or diarrhea.  She was seen 2/14 for the same.  Chest x-ray at that time was normal.  Her Omeprazole was increased to 40 mg daily and she was treated with Promethazine DM cough syrup.  She reports her symptoms have persisted.  She reports her significant other had similar complaints and was treated with steroid and an inhaler prescribed by urgent care. ? ?Review of Systems ? ?   ?Past Medical History:  ?Diagnosis Date  ? Anxiety   ? Depression   ? GERD (gastroesophageal reflux disease)   ? Hypertension   ? Migraine   ? ? ?Current Outpatient Medications  ?Medication Sig Dispense Refill  ? escitalopram (LEXAPRO) 20 MG tablet Take 20 mg by mouth daily.    ? esomeprazole (NEXIUM) 40 MG capsule Take 1 capsule (40 mg total) by mouth daily. 30 capsule 1  ? lisinopril (ZESTRIL) 10 MG tablet Take 10 mg by mouth daily.    ? Multiple Vitamin (MULTIVITAMIN) tablet Take 1 tablet by mouth daily.    ? ondansetron (ZOFRAN-ODT) 4 MG disintegrating tablet Take 1 tablet (4 mg total) by mouth every 8 (eight) hours as needed for nausea or vomiting. 30 tablet 0  ? promethazine-dextromethorphan (PROMETHAZINE-DM) 6.25-15 MG/5ML syrup Take 5 mLs by mouth at bedtime as needed for cough. 118 mL 0  ? Vitamin D, Ergocalciferol, (DRISDOL) 1.25 MG (50000 UNIT) CAPS capsule Take 1 capsule (50,000 Units total) by mouth every 7 (seven) days. 8 capsule 0  ? ?No current facility-administered medications for this visit.  ? ? ?No Known Allergies ? ?No family history on file. ? ?Social History  ? ?Socioeconomic History  ? Marital status: Single  ?  Spouse name: Not on file  ? Number  of children: Not on file  ? Years of education: Not on file  ? Highest education level: Not on file  ?Occupational History  ? Not on file  ?Tobacco Use  ? Smoking status: Never  ? Smokeless tobacco: Never  ?Vaping Use  ? Vaping Use: Never used  ?Substance and Sexual Activity  ? Alcohol use: Yes  ?  Alcohol/week: 6.0 standard drinks  ?  Types: 6 Standard drinks or equivalent per week  ? Drug use: Never  ? Sexual activity: Not on file  ?Other Topics Concern  ? Not on file  ?Social History Narrative  ? Not on file  ? ?Social Determinants of Health  ? ?Financial Resource Strain: Not on file  ?Food Insecurity: Not on file  ?Transportation Needs: Not on file  ?Physical Activity: Not on file  ?Stress: Not on file  ?Social Connections: Not on file  ?Intimate Partner Violence: Not on file  ? ? ? ?Constitutional: Denies fever, malaise, fatigue, headache or abrupt weight changes.  ?HEENT: Denies eye pain, eye redness, ear pain, ringing in the ears, wax buildup, runny nose, nasal congestion, bloody nose, or sore throat. ?Respiratory: Patient reports cough.  Denies difficulty breathing, shortness of breath.   ?Cardiovascular: Denies chest pain, chest tightness, palpitations or swelling in the hands or feet.  ?Gastrointestinal: Denies abdominal pain, bloating, constipation, diarrhea or blood  in the stool.  ? ?No other specific complaints in a complete review of systems (except as listed in HPI above). ? ?Objective:  ? Physical Exam ? ?BP 114/66 (BP Location: Left Arm, Patient Position: Sitting, Cuff Size: Large)   Pulse 84   Temp (!) 96.8 ?F (36 ?C) (Temporal)   Wt 200 lb (90.7 kg)   SpO2 100%   BMI 34.33 kg/m?  ? ?Wt Readings from Last 3 Encounters:  ?12/23/21 200 lb (90.7 kg)  ?08/18/21 225 lb 9.6 oz (102.3 kg)  ?11/11/18 190 lb (86.2 kg)  ? ? ?General: Appears her stated age, obese, in NAD. ?HEENT: Head: normal shape and size; Eyes: EOMs intact;  ?Neck: No adenopathy noted. ?Cardiovascular: Normal rate and rhythm. S1,S2  noted.  No murmur, rubs or gallops noted.  ?Pulmonary/Chest: Normal effort and positive vesicular breath sounds. No respiratory distress. No wheezes, rales or ronchi noted.  ?Neurological: Alert and oriented. ? ? ?BMET ?   ?Component Value Date/Time  ? NA 135 12/23/2021 1332  ? K 5.6 (H) 12/23/2021 1332  ? CL 101 12/23/2021 1332  ? CO2 22 12/23/2021 1332  ? GLUCOSE 86 12/23/2021 1332  ? BUN 5 (L) 12/23/2021 1332  ? CREATININE 0.76 12/23/2021 1332  ? CALCIUM 9.3 12/23/2021 1332  ? ? ?Lipid Panel  ?No results found for: CHOL, TRIG, HDL, CHOLHDL, VLDL, LDLCALC ? ?CBC ?   ?Component Value Date/Time  ? WBC 5.6 12/23/2021 1332  ? RBC 3.96 12/23/2021 1332  ? HGB 14.7 12/23/2021 1332  ? HCT 42.1 12/23/2021 1332  ? PLT 204 12/23/2021 1332  ? MCV 106.3 (H) 12/23/2021 1332  ? MCH 37.1 (H) 12/23/2021 1332  ? MCHC 34.9 12/23/2021 1332  ? RDW 11.1 12/23/2021 1332  ? ? ?Hgb A1C ?Lab Results  ?Component Value Date  ? HGBA1C 4.7 12/23/2021  ? ? ? ? ? ? ?   ?Assessment & Plan:  ? ?Subacute Cough: ? ?Exam today benign ?Discussed the possibility of this being a cough induced by her lisinopril.  Discussed holding this however she would like to hold off at this time ?Rx for Pred taper x6 days ?Rx for Albuterol 1 to 2 puffs every 4-6 hours as needed for cough and shortness of breath ?No indication for antibiotics at this time ?Omeprazole refilled today ? ?Return precautions discussed ? ?Webb Silversmith, NP ?This visit occurred during the SARS-CoV-2 public health emergency.  Safety protocols were in place, including screening questions prior to the visit, additional usage of staff PPE, and extensive cleaning of exam room while observing appropriate contact time as indicated for disinfecting solutions.  ? ?

## 2022-02-09 ENCOUNTER — Telehealth: Payer: Self-pay | Admitting: Internal Medicine

## 2022-02-09 NOTE — Telephone Encounter (Signed)
Pt advised.  She is going to call back to schedule an appointment.  ? ?Thanks,  ? ?-Mickel Baas  ?

## 2022-02-09 NOTE — Telephone Encounter (Signed)
Pt called and stated that she would like Monica Leon to know that she is still having a cough after prednisone. Pt would like to know if she needs to change BP medication. Please advise.  ?

## 2022-02-09 NOTE — Telephone Encounter (Signed)
Have her hold it for 10 days and then follow up with me for BP check ?

## 2022-02-13 ENCOUNTER — Other Ambulatory Visit: Payer: Self-pay | Admitting: Internal Medicine

## 2022-02-13 DIAGNOSIS — R052 Subacute cough: Secondary | ICD-10-CM

## 2022-02-13 NOTE — Telephone Encounter (Signed)
Requested Prescriptions  ?Pending Prescriptions Disp Refills  ?? albuterol (VENTOLIN HFA) 108 (90 Base) MCG/ACT inhaler [Pharmacy Med Name: ALBUTEROL HFA INH (200 PUFFS) 6.7GM] 6.7 g   ?  Sig: INHALE 2 PUFFS INTO THE LUNGS EVERY 6 HOURS AS NEEDED FOR WHEEZING OR SHORTNESS OF BREATH  ?  ? Pulmonology:  Beta Agonists 2 Passed - 02/13/2022  3:33 AM  ?  ?  Passed - Last BP in normal range  ?  BP Readings from Last 1 Encounters:  ?01/22/22 114/66  ?   ?  ?  Passed - Last Heart Rate in normal range  ?  Pulse Readings from Last 1 Encounters:  ?01/22/22 84  ?   ?  ?  Passed - Valid encounter within last 12 months  ?  Recent Outpatient Visits   ?      ? 3 weeks ago Subacute cough  ? [place], NP  ? 1 month ago Acute cough  ? [facility] [place] [place], NP  ? 5 months ago Need for prophylactic vaccination against single diseases  ? [facility] [place], Coralie Keens, NP  ?  ?  ?Future Appointments   ?        ? In 1 week Baity, Coralie Keens, NP [facility] [place]  ?  ? ?  ?  ?  ? ? ?

## 2022-02-20 ENCOUNTER — Ambulatory Visit (INDEPENDENT_AMBULATORY_CARE_PROVIDER_SITE_OTHER): Payer: BC Managed Care – PPO | Admitting: Internal Medicine

## 2022-02-20 ENCOUNTER — Encounter: Payer: Self-pay | Admitting: Internal Medicine

## 2022-02-20 VITALS — BP 120/84 | HR 96 | Temp 96.9°F | Wt 199.0 lb

## 2022-02-20 DIAGNOSIS — Z1231 Encounter for screening mammogram for malignant neoplasm of breast: Secondary | ICD-10-CM | POA: Diagnosis not present

## 2022-02-20 DIAGNOSIS — I10 Essential (primary) hypertension: Secondary | ICD-10-CM

## 2022-02-20 DIAGNOSIS — Z1211 Encounter for screening for malignant neoplasm of colon: Secondary | ICD-10-CM | POA: Diagnosis not present

## 2022-02-20 DIAGNOSIS — R053 Chronic cough: Secondary | ICD-10-CM

## 2022-02-20 DIAGNOSIS — K219 Gastro-esophageal reflux disease without esophagitis: Secondary | ICD-10-CM

## 2022-02-20 MED ORDER — FLUTICASONE PROPIONATE HFA 110 MCG/ACT IN AERO: 1.0000 | INHALATION_SPRAY | Freq: Two times a day (BID) | RESPIRATORY_TRACT | 2 refills | Status: DC

## 2022-02-20 NOTE — Progress Notes (Signed)
? ?Subjective:  ? ? Patient ID: Monica Leon, female    DOB: [birth year], 51 y.o.   MRN: [id number] ? ?HPI ? ?Patient presents to clinic today for follow-up of chronic cough and HTN.  She recently completed a Prednisone taper for a chronic cough.  She reports her cough did not improve.  She was advised that her Lisinopril may be the cause of her cough and was advised to hold this for 10 days.  Her BP today is 120/84.  Her cough is no better. She is taking Claritin, Omeprazole and using Albuterol as needed without any relief of symptoms. ? ?Review of Systems ? ?   ?Past Medical History:  ?Diagnosis Date  ? Anxiety   ? Depression   ? GERD (gastroesophageal reflux disease)   ? Hypertension   ? Migraine   ? ? ?Current Outpatient Medications  ?Medication Sig Dispense Refill  ? albuterol (VENTOLIN HFA) 108 (90 Base) MCG/ACT inhaler INHALE 2 PUFFS INTO THE LUNGS EVERY 6 HOURS AS NEEDED FOR WHEEZING OR SHORTNESS OF BREATH 6.7 g 0  ? escitalopram (LEXAPRO) 20 MG tablet Take 20 mg by mouth daily.    ? lisinopril (ZESTRIL) 10 MG tablet Take 10 mg by mouth daily.    ? Multiple Vitamin (MULTIVITAMIN) tablet Take 1 tablet by mouth daily.    ? omeprazole (PRILOSEC) 40 MG capsule Take 1 capsule (40 mg total) by mouth daily. 90 capsule 1  ? ondansetron (ZOFRAN-ODT) 4 MG disintegrating tablet Take 1 tablet (4 mg total) by mouth every 8 (eight) hours as needed for nausea or vomiting. 30 tablet 0  ? predniSONE (DELTASONE) 10 MG tablet Take 6 tabs on day 1, 5 tabs on day 2, 4 tabs on day 3, 3 tabs on day 4, 2 tabs on day 5, 1 tab on day 6 21 tablet 0  ? Vitamin D, Ergocalciferol, (DRISDOL) 1.25 MG (50000 UNIT) CAPS capsule Take 1 capsule (50,000 Units total) by mouth every 7 (seven) days. 8 capsule 0  ? ?No current facility-administered medications for this visit.  ? ? ?No Known Allergies ? ?No family history on file. ? ?Social History  ? ?Socioeconomic History  ? Marital status: Single  ?  Spouse name: Not on file  ? Number of  children: Not on file  ? Years of education: Not on file  ? Highest education level: Not on file  ?Occupational History  ? Not on file  ?Tobacco Use  ? Smoking status: Never  ? Smokeless tobacco: Never  ?Vaping Use  ? Vaping Use: Never used  ?Substance and Sexual Activity  ? Alcohol use: Yes  ?  Alcohol/week: 6.0 standard drinks  ?  Types: 6 Standard drinks or equivalent per week  ? Drug use: Never  ? Sexual activity: Not on file  ?Other Topics Concern  ? Not on file  ?Social History Narrative  ? Not on file  ? ?Social Determinants of Health  ? ?Financial Resource Strain: Not on file  ?Food Insecurity: Not on file  ?Transportation Needs: Not on file  ?Physical Activity: Not on file  ?Stress: Not on file  ?Social Connections: Not on file  ?Intimate Partner Violence: Not on file  ? ? ? ?Constitutional: Denies fever, malaise, fatigue, headache or abrupt weight changes.  ?HEENT: Denies eye pain, eye redness, ear pain, ringing in the ears, wax buildup, runny nose, nasal congestion, bloody nose, or sore throat. ?Respiratory: Pt reports chronic cough. Denies difficulty breathing, shortness of breath, or sputum production.   ?  Cardiovascular: Denies chest pain, chest tightness, palpitations or swelling in the hands or feet.  ?Gastrointestinal: Denies abdominal pain, bloating, constipation, diarrhea or blood in the stool.  ? ? ?No other specific complaints in a complete review of systems (except as listed in HPI above). ? ?Objective:  ? Physical Exam ? ?BP 120/84 (BP Location: Right Arm, Patient Position: Sitting, Cuff Size: Large)   Pulse 96   Temp (!) 96.9 ?F (36.1 ?C) (Temporal)   Wt 199 lb (90.3 kg)   SpO2 98%   BMI 34.16 kg/m?  ? ?Wt Readings from Last 3 Encounters:  ?01/22/22 200 lb (90.7 kg)  ?12/23/21 200 lb (90.7 kg)  ?08/18/21 225 lb 9.6 oz (102.3 kg)  ? ? ?General: Appears her stated age, obese, in NAD. ?Cardiovascular: Normal rate and rhythm.  ?Pulmonary/Chest: Normal effort and positive vesicular breath  sounds. No respiratory distress. No wheezes, rales or ronchi noted.  ?Neurological: Alert and oriented. Coordination normal.  ? ? ?BMET ?   ?Component Value Date/Time  ? NA 135 12/23/2021 1332  ? K 5.6 (H) 12/23/2021 1332  ? CL 101 12/23/2021 1332  ? CO2 22 12/23/2021 1332  ? GLUCOSE 86 12/23/2021 1332  ? BUN 5 (L) 12/23/2021 1332  ? CREATININE 0.76 12/23/2021 1332  ? CALCIUM 9.3 12/23/2021 1332  ? ? ?Lipid Panel  ?No results found for: CHOL, TRIG, HDL, CHOLHDL, VLDL, LDLCALC ? ?CBC ?   ?Component Value Date/Time  ? WBC 5.6 12/23/2021 1332  ? RBC 3.96 12/23/2021 1332  ? HGB 14.7 12/23/2021 1332  ? HCT 42.1 12/23/2021 1332  ? PLT 204 12/23/2021 1332  ? MCV 106.3 (H) 12/23/2021 1332  ? MCH 37.1 (H) 12/23/2021 1332  ? MCHC 34.9 12/23/2021 1332  ? RDW 11.1 12/23/2021 1332  ? ? ?Hgb A1C ?Lab Results  ?Component Value Date  ? HGBA1C 4.7 12/23/2021  ? ? ? ? ? ? ? ?   ?Assessment & Plan:  ? ?HTN, Chronic Cough, GERD: ? ?No improvement with Albuterol, will D/C ?No improvement with Claritin and Omeprazole but will continue for now ?BP controlled off Lisinopril, will D/C and continue to monitor ?RX for Flovent 110 mcg BID ?Consider referral to pulmonology for further evaluation of symptoms ? ?Return precautions discussed ? ? ?Webb Silversmith, NP ? ?

## 2022-02-20 NOTE — Patient Instructions (Signed)
Cough, Adult ?A cough helps to clear your throat and lungs. A cough may be a sign of an illness or another medical condition. ?An acute cough may only last 2-3 weeks, while a chronic cough may last 8 or more weeks. ?Many things can cause a cough. They include: ?Germs (viruses or bacteria) that attack the airway. ?Breathing in things that bother (irritate) your lungs. ?Allergies. ?Asthma. ?Mucus that runs down the back of your throat (postnasal drip). ?Smoking. ?Acid backing up from the stomach into the tube that moves food from the mouth to the stomach (gastroesophageal reflux). ?Some medicines. ?Lung problems. ?Other medical conditions, such as heart failure or a blood clot in the lung (pulmonary embolism). ?Follow these instructions at home: ?Medicines ?Take over-the-counter and prescription medicines only as told by your doctor. ?Talk with your doctor before you take medicines that stop a cough (cough suppressants). ?Lifestyle ? ?Do not smoke, and try not to be around smoke. Do not use any products that contain nicotine or tobacco, such as cigarettes, e-cigarettes, and chewing tobacco. If you need help quitting, ask your doctor. ?Drink enough fluid to keep your pee (urine) pale yellow. ?Avoid caffeine. ?Do not drink alcohol if your doctor tells you not to drink. ?General instructions ? ?Watch for any changes in your cough. Tell your doctor about them. ?Always cover your mouth when you cough. ?Stay away from things that make you cough, such as perfume, candles, campfire smoke, or cleaning products. ?If the air is dry, use a cool mist vaporizer or humidifier in your home. ?If your cough is worse at night, try using extra pillows to raise your head up higher while you sleep. ?Rest as needed. ?Keep all follow-up visits as told by your doctor. This is important. ?Contact a doctor if: ?You have new symptoms. ?You cough up pus. ?Your cough does not get better after 2-3 weeks, or your cough gets worse. ?Cough medicine  does not help your cough and you are not sleeping well. ?You have pain that gets worse or pain that is not helped with medicine. ?You have a fever. ?You are losing weight and you do not know why. ?You have night sweats. ?Get help right away if: ?You cough up blood. ?You have trouble breathing. ?Your heartbeat is very fast. ?These symptoms may be an emergency. Do not wait to see if the symptoms will go away. Get medical help right away. Call your local emergency services (911 in the [place] Do not drive yourself to the hospital. ?Summary ?A cough helps to clear your throat and lungs. Many things can cause a cough. ?Take over-the-counter and prescription medicines only as told by your doctor. ?Always cover your mouth when you cough. ?Contact a doctor if you have new symptoms or you have a cough that does not get better or gets worse. ?This information is not intended to replace advice given to you by your health care provider. Make sure you discuss any questions you have with your health care provider. ?Document Revised: 12/15/2019 Document Reviewed: 11/14/2018 ?Elsevier Patient Education ? [place]. ? ?

## 2022-02-25 ENCOUNTER — Telehealth: Payer: Self-pay

## 2022-02-25 DIAGNOSIS — Z1211 Encounter for screening for malignant neoplasm of colon: Secondary | ICD-10-CM

## 2022-02-25 NOTE — Telephone Encounter (Signed)
LVM for pt to return my call.

## 2022-02-26 ENCOUNTER — Telehealth: Payer: Self-pay

## 2022-02-26 ENCOUNTER — Other Ambulatory Visit: Payer: Self-pay

## 2022-02-26 DIAGNOSIS — Z1211 Encounter for screening for malignant neoplasm of colon: Secondary | ICD-10-CM

## 2022-02-26 MED ORDER — PEG 3350-KCL-NA BICARB-NACL 420 G PO SOLR: 4000.0000 mL | Freq: Once | ORAL | 0 refills | Status: AC

## 2022-02-26 NOTE — Telephone Encounter (Signed)
Patient returned call, requesting a call back between 12 and 1pm or anytime after 3pm  ?

## 2022-02-26 NOTE — Telephone Encounter (Signed)
CALLED PATIENT NO ANSWER LEFT VOICEMAIL FOR A CALL BACK °Letter sent °

## 2022-02-26 NOTE — Progress Notes (Signed)
Gastroenterology Pre-Procedure Review ? ?Request Date: 03/23/2022 ?Requesting Physician: Dr. Marius Ditch ? ? ?PATIENT REVIEW QUESTIONS: The patient responded to the following health history questions as indicated:   ? ?1. Are you having any GI issues? no ?2. Do you have a personal history of Polyps? no ?3. Do you have a family history of Colon Cancer or Polyps? no ?4. Diabetes Mellitus? no ?5. Joint replacements in the past 12 months?no ?6. Major health problems in the past 3 months?no ?7. Any artificial heart valves, MVP, or defibrillator?no ?   ?MEDICATIONS & ALLERGIES:    ?Patient reports the following regarding taking any anticoagulation/antiplatelet therapy:   ?Plavix, Coumadin, Eliquis, Xarelto, Lovenox, Pradaxa, Brilinta, or Effient? no ?Aspirin? no ? ?Patient confirms/reports the following medications:  ?Current Outpatient Medications  ?Medication Sig Dispense Refill  ? escitalopram (LEXAPRO) 20 MG tablet Take 20 mg by mouth daily.    ? fluticasone (FLOVENT HFA) 110 MCG/ACT inhaler Inhale 1 puff into the lungs 2 (two) times daily. 1 each 2  ? Multiple Vitamin (MULTIVITAMIN) tablet Take 1 tablet by mouth daily.    ? omeprazole (PRILOSEC) 40 MG capsule Take 1 capsule (40 mg total) by mouth daily. 90 capsule 1  ? ondansetron (ZOFRAN-ODT) 4 MG disintegrating tablet Take 1 tablet (4 mg total) by mouth every 8 (eight) hours as needed for nausea or vomiting. 30 tablet 0  ? Vitamin D, Ergocalciferol, (DRISDOL) 1.25 MG (50000 UNIT) CAPS capsule Take 1 capsule (50,000 Units total) by mouth every 7 (seven) days. 8 capsule 0  ? ?No current facility-administered medications for this visit.  ? ? ?Patient confirms/reports the following allergies:  ?No Known Allergies ? ?No orders of the defined types were placed in this encounter. ? ? ?AUTHORIZATION INFORMATION ?Primary Insurance: ?1D#: ?Group #: ? ?Secondary Insurance: ?1D#: ?Group #: ? ?SCHEDULE INFORMATION: ?Date: 03/23/2022 ?Time: ?Location:[facility] ? ?

## 2022-03-12 ENCOUNTER — Other Ambulatory Visit: Payer: Self-pay | Admitting: Internal Medicine

## 2022-03-12 DIAGNOSIS — E559 Vitamin D deficiency, unspecified: Secondary | ICD-10-CM

## 2022-03-12 NOTE — Telephone Encounter (Signed)
Requested medication (s) are due for refill today: Yes ? ?Requested medication (s) are on the active medication list: Yes ? ?Last refill:  01/15/22 ? ?Future visit scheduled: Yes ? ?Notes to clinic:  Protocol indicates PCP review. ? ? ? ?Requested Prescriptions  ?Pending Prescriptions Disp Refills  ? Vitamin D, Ergocalciferol, (DRISDOL) 1.25 MG (50000 UNIT) CAPS capsule [Pharmacy Med Name: VITAMIN D2 50,000IU (ERGO) CAP RX] 8 capsule 0  ?  Sig: TAKE 1 CAPSULE BY MOUTH EVERY 7 DAYS  ?  ? Endocrinology:  Vitamins - Vitamin D Supplementation 2 Failed - 03/12/2022  3:22 AM  ?  ?  Failed - Manual Review: Route requests for 50,000 IU strength to the provider  ?  ?  Failed - Vitamin D in normal range and within 360 days  ?  Vit D, 25-Hydroxy  ?Date Value Ref Range Status  ?12/23/2021 18 (L) 30 - 100 ng/mL Final  ?  Comment:  ?  Vitamin D Status         25-OH Vitamin D: ?. ?Deficiency:                    <20 ng/mL ?Insufficiency:             20 - 29 ng/mL ?Optimal:                 > or = 30 ng/mL ?Marland Kitchen ?For 25-OH Vitamin D testing on patients on  ?D2-supplementation and patients for whom quantitation  ?of D2 and D3 fractions is required, the [employer](TM) ?25-OH VIT D, (D2,D3), LC/MS/MS is recommended: order  ?code [phone] (patients >69yr). ?See Note 1 ?. ?Note 1 ?. ?For additional information, please refer to  ?http://education.Quest[facility].com/faq/FAQ199  ?(This link is being provided for informational/ ?educational purposes only.) ?  ?  ?  ?  ?  Passed - Ca in normal range and within 360 days  ?  Calcium  ?Date Value Ref Range Status  ?12/23/2021 9.3 8.6 - 10.4 mg/dL Final  ?  ?  ?  ?  Passed - Valid encounter within last 12 months  ?  Recent Outpatient Visits   ? ?      ? 2 weeks ago Chronic cough  ? [place]  ? 1 month ago Subacute cough  ? [place]  ? 2 months ago [facility] cough  ? [facility]  ? 6 months ago  Need for prophylactic vaccination against single diseases  ? [facility]  ? ?  ?  ?Future Appointments   ? ?        ? In 2 weeks Baity, [facility] Ke[facility] ? ?  ? ? ?  ?  ?  ? ?

## 2022-03-12 NOTE — Telephone Encounter (Signed)
She needs a lab only appt to recheck Vit D ?

## 2022-03-13 NOTE — Telephone Encounter (Signed)
Pt advised.  She has a physical scheduled for 03/27/2022 she will get her labs done at that time.  ? ?Thanks,  ? ?-Mickel Baas  ?

## 2022-03-16 ENCOUNTER — Encounter: Payer: Self-pay | Admitting: Emergency Medicine

## 2022-03-16 ENCOUNTER — Emergency Department
Admission: EM | Admit: 2022-03-16 | Discharge: 2022-03-16 | Disposition: A | Discharge status: Home / Self Care | Payer: BC Managed Care – PPO | Attending: Emergency Medicine | Admitting: Emergency Medicine

## 2022-03-16 ENCOUNTER — Other Ambulatory Visit: Payer: Self-pay

## 2022-03-16 DIAGNOSIS — R1110 Vomiting, unspecified: Secondary | ICD-10-CM | POA: Diagnosis not present

## 2022-03-16 DIAGNOSIS — R770 Abnormality of albumin: Secondary | ICD-10-CM | POA: Diagnosis not present

## 2022-03-16 DIAGNOSIS — R7401 Elevation of levels of liver transaminase levels: Secondary | ICD-10-CM | POA: Diagnosis not present

## 2022-03-16 DIAGNOSIS — R748 Abnormal levels of other serum enzymes: Secondary | ICD-10-CM | POA: Diagnosis not present

## 2022-03-16 DIAGNOSIS — I10 Essential (primary) hypertension: Secondary | ICD-10-CM | POA: Insufficient documentation

## 2022-03-16 DIAGNOSIS — R1115 Cyclical vomiting syndrome unrelated to migraine: Secondary | ICD-10-CM

## 2022-03-16 DIAGNOSIS — E876 Hypokalemia: Secondary | ICD-10-CM | POA: Insufficient documentation

## 2022-03-16 LAB — COMPREHENSIVE METABOLIC PANEL
ALT: 130 U/L — ABNORMAL HIGH (ref 0–44)
AST: 231 U/L — ABNORMAL HIGH (ref 15–41)
Albumin: 3.1 g/dL — ABNORMAL LOW (ref 3.5–5.0)
Alkaline Phosphatase: 134 U/L — ABNORMAL HIGH (ref 38–126)
Anion gap: 13 (ref 5–15)
BUN: <5 mg/dL — ABNORMAL LOW (ref 6–20)
CO2: 20 mmol/L — ABNORMAL LOW (ref 22–32)
Calcium: 9.4 mg/dL (ref 8.9–10.3)
Chloride: 106 mmol/L (ref 98–111)
Creatinine, Ser: 0.74 mg/dL (ref 0.44–1.00)
GFR, Estimated: >60 mL/min (ref >60)
Glucose, Bld: 128 mg/dL — ABNORMAL HIGH (ref 70–99)
Potassium: 3.3 mmol/L — ABNORMAL LOW (ref 3.5–5.1)
Sodium: 139 mmol/L (ref 135–145)
Total Bilirubin: 2.7 mg/dL — ABNORMAL HIGH (ref 0.3–1.2)
Total Protein: 6.4 g/dL — ABNORMAL LOW (ref 6.5–8.1)

## 2022-03-16 LAB — CBC
HCT: 44.5 % (ref 36.0–46.0)
Hemoglobin: 14.5 g/dL (ref 12.0–15.0)
MCH: 35.3 pg — ABNORMAL HIGH (ref 26.0–34.0)
MCHC: 32.6 g/dL (ref 30.0–36.0)
MCV: 108.3 fL — ABNORMAL HIGH (ref 80.0–100.0)
Platelets: 151 10*3/uL (ref 150–400)
RBC: 4.11 MIL/uL (ref 3.87–5.11)
RDW: 11.9 % (ref 11.5–15.5)
WBC: 4.2 10*3/uL (ref 4.0–10.5)
nRBC: 0 % (ref 0.0–0.2)

## 2022-03-16 LAB — LIPASE, BLOOD: Lipase: 26 U/L (ref 11–51)

## 2022-03-16 MED ORDER — PANTOPRAZOLE SODIUM 40 MG IV SOLR
40.0000 mg | Freq: Once | INTRAVENOUS | Status: AC
  Administered 2022-03-16: 40 mg via INTRAVENOUS
  Filled 2022-03-16: qty 10

## 2022-03-16 MED ORDER — POTASSIUM CHLORIDE CRYS ER 20 MEQ PO TBCR
40.0000 meq | EXTENDED_RELEASE_TABLET | Freq: Once | ORAL | Status: AC
  Administered 2022-03-16: 40 meq via ORAL
  Filled 2022-03-16: qty 2

## 2022-03-16 MED ORDER — METOCLOPRAMIDE HCL 10 MG PO TABS: 10.0000 mg | ORAL_TABLET | Freq: Three times a day (TID) | ORAL | 0 refills | Status: DC | PRN

## 2022-03-16 MED ORDER — ONDANSETRON HCL 4 MG/2ML IJ SOLN
4.0000 mg | Freq: Once | INTRAMUSCULAR | Status: AC
  Administered 2022-03-16: 4 mg via INTRAVENOUS
  Filled 2022-03-16: qty 2

## 2022-03-16 MED ORDER — LACTATED RINGERS IV BOLUS
1000.0000 mL | Freq: Once | INTRAVENOUS | Status: AC
Start: 2022-03-16 — End: 2022-03-16
  Administered 2022-03-16: 1000 mL via INTRAVENOUS

## 2022-03-16 NOTE — ED Triage Notes (Signed)
Pt states has been vomiting daily for 15 years. Pt states over last two days has gotten worse. Pt states she is loosing weight and having trouble keeping po down. Pt states she also has no appetite.  ?

## 2022-03-16 NOTE — ED Provider Notes (Signed)
? ?[facility] ?Provider Note ? ? ? Event Date/Time  ? First MD Initiated Contact with Patient 03/16/22 [phone]   ?  (approximate) ? ? ?History  ? ?Chief Complaint ?Vomiting ? ? ?HPI ?Monica Leon is a 51 y.o. female, history of hypertension, anxiety/depression, GERD, class II obesity, presents to the emergency department for evaluation of emesis.  She states that she had gastric bypass surgery approximately 15 years ago.  Since then she has had persistent daily nausea/vomiting.  She states that most of her vomiting is dry heaving.  Additionally endorses intermittent diarrhea.  States she is only able to eat minimal amounts of food every day.  She states that recently over the past week, it is worsened.  She has only been able to eat very small food portions in the morning and ginger ale.  Reports mild abdominal discomfort, but otherwise no significant pain.  She has been treating the nausea with daily ondansetron, with minimal relief.  She states that she does not see a regular gastroenterologist, though does have a routine colonoscopy coming up.  Denies fever/chills, chest pain, shortness of breath, flank pain, hematochezia,  ? ?History Limitations: No limitations. ? ?    ? ? ?Physical Exam  ?Triage Vital Signs: ?ED Triage Vitals  ?Enc Vitals Group  ?   BP 03/16/22 0651 (!) 167/117  ?   Pulse Rate 03/16/22 0651 100  ?   Resp 03/16/22 0651 16  ?   Temp 03/16/22 0651 98 ?F (36.7 ?C)  ?   Temp Source 03/16/22 0651 Oral  ?   SpO2 03/16/22 0651 100 %  ?   Weight 03/16/22 0652 184 lb (83.5 kg)  ?   Height 03/16/22 0652 5' 4"  (1.626 m)  ?   Head Circumference --   ?   Peak Flow --   ?   Pain Score --   ?   Pain Loc --   ?   Pain Edu? --   ?   Excl. in [place]? --   ? ? ?Most recent vital signs: ?Vitals:  ? 03/16/22 0651  ?BP: (!) 167/117  ?Pulse: 100  ?Resp: 16  ?Temp: 98 ?F (36.7 ?C)  ?SpO2: 100%  ? ? ?General: Awake, NAD.  ?Skin: Warm, dry. No rashes or lesions.  ?Eyes: PERRL. Conjunctivae normal.   ?CV: Good peripheral perfusion.  ?Resp: Normal effort.  ?Abd: Soft, non-tender. No distention.  No CVA tenderness ?Neuro: At baseline. No gross neurological deficits.  ? ?Focused Exam:  ? ?Physical Exam ? ? ? ?ED Results / Procedures / Treatments  ?Labs ?(all labs ordered are listed, but only abnormal results are displayed) ?Labs Reviewed  ?COMPREHENSIVE METABOLIC PANEL - Abnormal; Notable for the following components:  ?    Result Value  ? Potassium 3.3 (*)   ? CO2 20 (*)   ? Glucose, Bld 128 (*)   ? BUN <5 (*)   ? Total Protein 6.4 (*)   ? Albumin 3.1 (*)   ? AST 231 (*)   ? ALT 130 (*)   ? Alkaline Phosphatase 134 (*)   ? Total Bilirubin 2.7 (*)   ? All other components within normal limits  ?CBC - Abnormal; Notable for the following components:  ? MCV 108.3 (*)   ? MCH 35.3 (*)   ? All other components within normal limits  ?LIPASE, BLOOD  ? ? ? ?EKG ?N/A. ? ? ?RADIOLOGY ? ?ED Provider Interpretation: N/A. ? ?No results found. ? ?  PROCEDURES: ? ?Critical Care performed: None. ? ?Procedures ? ? ? ?MEDICATIONS ORDERED IN ED: ?Medications  ?lactated ringers bolus 1,000 mL (0 mLs Intravenous Stopped 03/16/22 0920)  ?ondansetron [facility]) injection 4 mg (4 mg Intravenous Given 03/16/22 0741)  ?pantoprazole (PROTONIX) injection 40 mg (40 mg Intravenous Given 03/16/22 0743)  ?potassium chloride SA (KLOR-CON M) CR tablet 40 mEq (40 mEq Oral Given 03/16/22 0741)  ? ? ? ?IMPRESSION / MDM / ASSESSMENT AND PLAN / ED COURSE  ?I reviewed the triage vital signs and the nursing notes. ?             ?               ? ?Differential diagnosis includes, but is not limited to, cholelithiasis, GERD, PUD, cholecystitis, gastritis,  ? ?ED Course ?Patient appears well, no but hypertensive at 167/117, consistent with her history of hypertension.  NAD. ? ?CBC shows no leukocytosis or anemia. ? ?CMP notable for hypokalemia at 3.3, elevated glucose at 128, hypoalbuminemia at 3.1, and transaminitis with AST of 231 and ALT 130.  Elevated alkaline  phosphatase at 134. ? ?Lipase unremarkable at 26.  Unlikely pancreatitis. ? ?Assessment/Plan ?Patient presents with chronic nausea/vomiting has been ongoing for the past 15 years.  Treated here with ondansetron, potassium, and pantoprazole.  Her CMP does show a transaminitis, which is consistent with her previous lab results 2 months prior where she received a right upper quadrant ultrasound showing no acute findings.  Her physical exam is unimpressive.  CBC shows no leukocytosis.  Unlikely to have any acute pathology at this time.  Spoke with Dr. Vladimir Crofts, who agreed she is unlikely to benefit from CT imaging at this time.  She reportedly has a gastroenterologist that she is scheduled to have a colonoscopy with, though has not spoken with him about this issue.  Advised her to speak with her gastroenterologist about this.  We will plan to discharge her with antiemetics.  ? ?Considered admission for this patient, but given her stable presentation and reassuring work-up, she is unlikely to benefit.  ? ?Provided the patient with anticipatory guidance, return precautions, and educational material. Encouraged the patient to return to the emergency department at any time if they begin to experience any new or worsening symptoms. Patient expressed understanding and agreed with the plan.  ? ?  ? ? ?FINAL CLINICAL IMPRESSION(S) / ED DIAGNOSES  ? ?Final diagnoses:  ?Emesis, persistent  ? ? ? ?Rx / DC Orders  ? ?ED Discharge Orders   ? ?      Ordered  ?  metoCLOPramide (REGLAN) 10 MG tablet  Every 8 hours PRN       ? 03/16/22 2863  ? ?  ?  ? ?  ? ? ? ?Note:  This document was prepared using Dragon voice recognition software and may include unintentional dictation errors. ?  ?Teodoro Spray, [place] ?03/16/22 1601 ? ?  ?Vladimir Crofts, MD ?03/18/22 0408 ? ?

## 2022-03-16 NOTE — ED Notes (Signed)
See triage note  presents with some vomiting  states this has been an issue for several years  has become worse    no fever   ?

## 2022-03-16 NOTE — Discharge Instructions (Addendum)
-  Follow-up with your own gastroenterologist or with the gastroenterologist listed above as needed for further evaluation and management. ? ?-Return to the emergency department anytime if you begin to experience any new or worsening symptoms. ? ? ?

## 2022-03-20 ENCOUNTER — Other Ambulatory Visit: Payer: Self-pay | Admitting: Internal Medicine

## 2022-03-20 ENCOUNTER — Encounter: Payer: Self-pay | Admitting: Gastroenterology

## 2022-03-20 MED ORDER — ONDANSETRON 4 MG PO TBDP: 4.0000 mg | ORAL_TABLET | Freq: Three times a day (TID) | ORAL | 0 refills | Status: DC | PRN

## 2022-03-23 ENCOUNTER — Telehealth: Payer: Self-pay

## 2022-03-23 ENCOUNTER — Ambulatory Visit
Admission: RE | Admit: 2022-03-23 | Discharge: 2022-03-23 | Disposition: A | Discharge status: Home / Self Care | Payer: BC Managed Care – PPO | Attending: Gastroenterology | Admitting: Gastroenterology

## 2022-03-23 ENCOUNTER — Other Ambulatory Visit: Payer: Self-pay | Admitting: Gastroenterology

## 2022-03-23 ENCOUNTER — Encounter: Payer: Self-pay | Admitting: Gastroenterology

## 2022-03-23 ENCOUNTER — Ambulatory Visit: Payer: BC Managed Care – PPO | Admitting: Anesthesiology

## 2022-03-23 ENCOUNTER — Encounter
Admission: RE | Disposition: A | Discharge status: Home / Self Care | Payer: Self-pay | Source: Home / Self Care | Attending: Gastroenterology

## 2022-03-23 DIAGNOSIS — Z980 Intestinal bypass and anastomosis status: Secondary | ICD-10-CM | POA: Insufficient documentation

## 2022-03-23 DIAGNOSIS — F32A Depression, unspecified: Secondary | ICD-10-CM | POA: Diagnosis not present

## 2022-03-23 DIAGNOSIS — K219 Gastro-esophageal reflux disease without esophagitis: Secondary | ICD-10-CM | POA: Diagnosis not present

## 2022-03-23 DIAGNOSIS — Z1211 Encounter for screening for malignant neoplasm of colon: Secondary | ICD-10-CM | POA: Diagnosis not present

## 2022-03-23 DIAGNOSIS — D123 Benign neoplasm of transverse colon: Secondary | ICD-10-CM | POA: Insufficient documentation

## 2022-03-23 DIAGNOSIS — R634 Abnormal weight loss: Secondary | ICD-10-CM | POA: Insufficient documentation

## 2022-03-23 DIAGNOSIS — K5730 Diverticulosis of large intestine without perforation or abscess without bleeding: Secondary | ICD-10-CM | POA: Diagnosis not present

## 2022-03-23 DIAGNOSIS — K644 Residual hemorrhoidal skin tags: Secondary | ICD-10-CM | POA: Diagnosis not present

## 2022-03-23 DIAGNOSIS — K222 Esophageal obstruction: Secondary | ICD-10-CM

## 2022-03-23 DIAGNOSIS — K635 Polyp of colon: Secondary | ICD-10-CM

## 2022-03-23 DIAGNOSIS — R1319 Other dysphagia: Secondary | ICD-10-CM | POA: Diagnosis not present

## 2022-03-23 DIAGNOSIS — K2289 Other specified disease of esophagus: Secondary | ICD-10-CM | POA: Insufficient documentation

## 2022-03-23 DIAGNOSIS — F419 Anxiety disorder, unspecified: Secondary | ICD-10-CM | POA: Diagnosis not present

## 2022-03-23 DIAGNOSIS — R1314 Dysphagia, pharyngoesophageal phase: Secondary | ICD-10-CM | POA: Insufficient documentation

## 2022-03-23 DIAGNOSIS — I10 Essential (primary) hypertension: Secondary | ICD-10-CM | POA: Diagnosis not present

## 2022-03-23 HISTORY — PX: COLONOSCOPY WITH PROPOFOL: SHX5780

## 2022-03-23 HISTORY — PX: ESOPHAGOGASTRODUODENOSCOPY: SHX5428

## 2022-03-23 SURGERY — COLONOSCOPY WITH PROPOFOL
Anesthesia: General

## 2022-03-23 MED ORDER — PROPOFOL 10 MG/ML IV BOLUS
INTRAVENOUS | Status: DC | PRN
Start: 2022-03-23 — End: 2022-03-23
  Administered 2022-03-23 (×2): 30 mg via INTRAVENOUS
  Administered 2022-03-23: 100 mg via INTRAVENOUS

## 2022-03-23 MED ORDER — EPHEDRINE 5 MG/ML INJ
INTRAVENOUS | Status: AC
  Filled 2022-03-23: qty 5

## 2022-03-23 MED ORDER — PHENYLEPHRINE HCL (PRESSORS) 10 MG/ML IV SOLN
INTRAVENOUS | Status: DC | PRN
Start: 2022-03-23 — End: 2022-03-23
  Administered 2022-03-23: 80 ug via INTRAVENOUS

## 2022-03-23 MED ORDER — PROPOFOL 500 MG/50ML IV EMUL
INTRAVENOUS | Status: AC
  Filled 2022-03-23: qty 50

## 2022-03-23 MED ORDER — DEXMEDETOMIDINE HCL IN NACL 200 MCG/50ML IV SOLN
INTRAVENOUS | Status: DC | PRN
  Administered 2022-03-23: 4 ug via INTRAVENOUS
  Administered 2022-03-23 (×2): 8 ug via INTRAVENOUS

## 2022-03-23 MED ORDER — LIDOCAINE HCL (PF) 2 % IJ SOLN
INTRAMUSCULAR | Status: AC
  Filled 2022-03-23: qty 5

## 2022-03-23 MED ORDER — DEXMEDETOMIDINE HCL IN NACL 80 MCG/20ML IV SOLN
INTRAVENOUS | Status: AC
  Filled 2022-03-23: qty 20

## 2022-03-23 MED ORDER — PHENYLEPHRINE 80 MCG/ML (10ML) SYRINGE FOR IV PUSH (FOR BLOOD PRESSURE SUPPORT)
PREFILLED_SYRINGE | INTRAVENOUS | Status: AC
  Filled 2022-03-23: qty 10

## 2022-03-23 MED ORDER — PROPOFOL 10 MG/ML IV BOLUS
INTRAVENOUS | Status: AC
  Filled 2022-03-23: qty 20

## 2022-03-23 MED ORDER — PROPOFOL 500 MG/50ML IV EMUL
INTRAVENOUS | Status: DC | PRN
  Administered 2022-03-23: 120 ug/kg/min via INTRAVENOUS

## 2022-03-23 MED ORDER — OMEPRAZOLE 40 MG PO CPDR: 40.0000 mg | DELAYED_RELEASE_CAPSULE | Freq: Two times a day (BID) | ORAL | 3 refills | Status: DC

## 2022-03-23 MED ORDER — SODIUM CHLORIDE 0.9 % IV SOLN: INTRAVENOUS | Status: DC

## 2022-03-23 NOTE — Anesthesia Preprocedure Evaluation (Signed)
Anesthesia Evaluation  ?Patient identified by MRN, date of birth, ID band ?Patient awake ? ? ? ?Reviewed: ?Allergy & Precautions, H&P , NPO status , Patient's Chart, lab work & pertinent test results, reviewed documented beta blocker date and time  ? ?History of Anesthesia Complications ?Negative for: history of anesthetic complications ? ?Airway ?Mallampati: III ? ?TM Distance: >3 FB ?Neck ROM: full ? ?Mouth opening: Limited Mouth Opening ? Dental ? ?(+) Dental Advidsory Given, Caps, Teeth Intact ?  ?Pulmonary ?neg pulmonary ROS,  ?  ?Pulmonary exam normal ?breath sounds clear to auscultation ? ? ? ? ? ? Cardiovascular ?Exercise Tolerance: Good ?negative cardio ROS ?Normal cardiovascular exam ?Rhythm:regular Rate:Normal ? ? ?  ?Neuro/Psych ?PSYCHIATRIC DISORDERS Anxiety Depression negative neurological ROS ?   ? GI/Hepatic ?Neg liver ROS, GERD  ,  ?Endo/Other  ?negative endocrine ROS ? Renal/GU ?negative Renal ROS  ?negative genitourinary ?  ?Musculoskeletal ? ? Abdominal ?  ?Peds ? Hematology ?negative hematology ROS ?(+)   ?Anesthesia Other Findings ?Past Medical History: ?No date: Anxiety ?No date: Depression ?No date: GERD (gastroesophageal reflux disease) ?No date: Hypertension ?No date: Migraine ? ? Reproductive/Obstetrics ?negative OB ROS ? ?  ? ? ? ? ? ? ? ? ? ? ? ? ? ?  ?  ? ? ? ? ? ? ? ? ?Anesthesia Physical ?Anesthesia Plan ? ?ASA: 2 ? ?Anesthesia Plan: General  ? ?Post-op Pain Management:   ? ?Induction: Intravenous ? ?PONV Risk Score and Plan: 3 and Propofol infusion and TIVA ? ?Airway Management Planned: Natural Airway and Nasal Cannula ? ?Additional Equipment:  ? ?Intra-op Plan:  ? ?Post-operative Plan:  ? ?Informed Consent: I have reviewed the patients History and Physical, chart, labs and discussed the procedure including the risks, benefits and alternatives for the proposed anesthesia with the patient or authorized representative who has indicated his/her  understanding and acceptance.  ? ? ? ?Dental Advisory Given ? ?Plan Discussed with: Anesthesiologist, CRNA and Surgeon ? ?Anesthesia Plan Comments:   ? ? ? ? ? ? ?Anesthesia Quick Evaluation ? ?

## 2022-03-23 NOTE — H&P (Signed)
?Monica Darby, MD ?[address]  ?[address]  ?[place], [place] [address]  ?Main: [phone]  ?Fax: [phone] ?Pager: [phone] ? ?Primary Care Physician:  Jearld Fenton, NP ?Primary Gastroenterologist:  Dr. Cephas Leon ? ?Pre-Procedure History & Physical: ?HPI:  Monica Leon is a 51 y.o. female is here for an colonoscopy. ?  ?Past Medical History:  ?Diagnosis Date  ? Anxiety   ? Depression   ? GERD (gastroesophageal reflux disease)   ? Hypertension   ? Migraine   ? ? ?Past Surgical History:  ?Procedure Laterality Date  ? GASTRIC BYPASS  2008  ? ? ?Prior to Admission medications   ?Medication Sig Start Date End Date Taking? Authorizing Provider  ?escitalopram (LEXAPRO) 20 MG tablet Take 20 mg by mouth daily. 07/30/21  Yes [provider]  ?omeprazole (PRILOSEC) 40 MG capsule Take 1 capsule (40 mg total) by mouth daily. 01/22/22  Yes Jearld Fenton, NP  ?fluticasone (FLOVENT HFA) 110 MCG/ACT inhaler Inhale 1 puff into the lungs 2 (two) times daily. 02/20/22   Jearld Fenton, NP  ?metoCLOPramide (REGLAN) 10 MG tablet Take 1 tablet (10 mg total) by mouth every 8 (eight) hours as needed for nausea. 03/16/22 03/16/23  Vladimir Crofts, MD  ?Multiple Vitamin (MULTIVITAMIN) tablet Take 1 tablet by mouth daily.    [provider]  ?ondansetron (ZOFRAN-ODT) 4 MG disintegrating tablet Take 1 tablet (4 mg total) by mouth every 8 (eight) hours as needed for nausea or vomiting. 03/20/22   Jearld Fenton, NP  ?Vitamin D, Ergocalciferol, (DRISDOL) 1.25 MG (50000 UNIT) CAPS capsule Take 1 capsule (50,000 Units total) by mouth every 7 (seven) days. 01/15/22   Jearld Fenton, NP  ? ? ?Allergies as of 02/27/2022  ? (No Known Allergies)  ? ? ?History reviewed. No pertinent family history. ? ?Social History  ? ?Socioeconomic History  ? Marital status: Single  ?  Spouse name: Not on file  ? Number of children: Not on file  ? Years of education: Not on file  ? Highest education level: Not on file   ?Occupational History  ? Not on file  ?Tobacco Use  ? Smoking status: Never  ? Smokeless tobacco: Never  ?Vaping Use  ? Vaping Use: Never used  ?Substance and Sexual Activity  ? Alcohol use: Yes  ?  Alcohol/week: 6.0 standard drinks  ?  Types: 6 Standard drinks or equivalent per week  ? Drug use: Never  ? Sexual activity: Not on file  ?Other Topics Concern  ? Not on file  ?Social History Narrative  ? Not on file  ? ?Social Determinants of Health  ? ?Financial Resource Strain: Not on file  ?Food Insecurity: Not on file  ?Transportation Needs: Not on file  ?Physical Activity: Not on file  ?Stress: Not on file  ?Social Connections: Not on file  ?Intimate Partner Violence: Not on file  ? ? ?Review of Systems: ?See HPI, otherwise negative ROS ? ?Physical Exam: ?BP (!) 141/96   Pulse 77   Temp (!) 97.1 ?F (36.2 ?C) (Temporal)   Resp 18   Ht 5' 4"  (1.626 m)   Wt 85.7 kg   SpO2 100%   BMI 32.44 kg/m?  ?General:   Alert,  pleasant and cooperative in NAD ?Head:  Normocephalic and atraumatic. ?Neck:  Supple; no masses or thyromegaly. ?Lungs:  Clear throughout to auscultation.    ?Heart:  Regular rate and rhythm. ?Abdomen:  Soft, nontender and nondistended. Normal bowel  sounds, without guarding, and without rebound.   ?Neurologic:  Alert and  oriented x4;  grossly normal neurologically. ? ?Impression/Plan: ?Monica Leon is here for an colonoscopy to be performed for colon cancer screening ? ?Risks, benefits, limitations, and alternatives regarding  colonoscopy have been reviewed with the patient.  Questions have been answered.  All parties agreeable. ? ? ?Sherri Sear, MD  03/23/2022, 8:59 AM ?

## 2022-03-23 NOTE — Transfer of Care (Signed)
Immediate Anesthesia Transfer of Care Note ? ?Patient: Monica Leon ? ?Procedure(s) Performed: COLONOSCOPY WITH PROPOFOL ?ESOPHAGOGASTRODUODENOSCOPY (EGD) ? ?Patient Location: PACU and Endoscopy Unit ? ?Anesthesia Type:General ? ?Level of Consciousness: drowsy and patient cooperative ? ?Airway & Oxygen Therapy: Patient Spontanous Breathing ? ?Post-op Assessment: Report given to RN and Post -op Vital signs reviewed and stable ? ?Post vital signs: Reviewed and stable ? ?Last Vitals:  ?Vitals Value Taken Time  ?BP 89/57 03/23/22 1006  ?Temp    ?Pulse 75 03/23/22 1008  ?Resp 23 03/23/22 1008  ?SpO2 100 % 03/23/22 1008  ?Vitals shown include unvalidated device data. ? ?Last Pain:  ?Vitals:  ? 03/23/22 1000  ?TempSrc: Temporal  ?PainSc:   ?   ? ?  ? ?Complications: No notable events documented. ?

## 2022-03-23 NOTE — Op Note (Signed)
[facility] ?Gastroenterology ?Patient Name: Monica Leon ?Procedure Date: 03/23/2022 9:07 AM ?MRN: [id number] ?Account #: [id number] ?Date of Birth: Oct 13, 1971 ?Admit Type: Outpatient ?Age: 51 ?Room: endo 1 ?Gender: Female ?Note Status: Finalized ?Instrument Name: Upper-Endoscope [id number] ?Procedure:             Upper GI endoscopy ?Indications:           Esophageal dysphagia, Weight loss ?Providers:             Lin Landsman MD, MD ?Medicines:             General Anesthesia ?Complications:         No immediate complications. Estimated blood loss:  ?                       Minimal. ?Procedure:             Pre-Anesthesia Assessment: ?                       - Prior to the procedure, a History and Physical was  ?                       performed, and patient medications and allergies were  ?                       reviewed. The patient is competent. The risks and  ?                       benefits of the procedure and the sedation options and  ?                       risks were discussed with the patient. All questions  ?                       were answered and informed consent was obtained.  ?                       Patient identification and proposed procedure were  ?                       verified by the physician, the nurse, the  ?                       anesthesiologist, the anesthetist and the technician  ?                       in the pre-procedure area in the procedure room in the  ?                       endoscopy suite. Mental Status Examination: alert and  ?                       oriented. Airway Examination: normal oropharyngeal  ?                       airway and neck mobility. Respiratory Examination:  ?                       clear to auscultation. CV Examination: normal.  ?  Prophylactic Antibiotics: The patient does not require  ?                       prophylactic antibiotics. Prior Anticoagulants: The  ?                       patient has taken no previous  anticoagulant or  ?                       antiplatelet agents. ASA Grade Assessment: II - A  ?                       patient with mild systemic disease. After reviewing  ?                       the risks and benefits, the patient was deemed in  ?                       satisfactory condition to undergo the procedure. The  ?                       anesthesia plan was to use general anesthesia.  ?                       Immediately prior to administration of medications,  ?                       the patient was re-assessed for adequacy to receive  ?                       sedatives. The heart rate, respiratory rate, oxygen  ?                       saturations, blood pressure, adequacy of pulmonary  ?                       ventilation, and response to care were monitored  ?                       throughout the procedure. The physical status of the  ?                       patient was re-assessed after the procedure. ?                       After obtaining informed consent, the endoscope was  ?                       passed under direct vision. Throughout the procedure,  ?                       the patient's blood pressure, pulse, and oxygen  ?                       saturations were monitored continuously. The Endoscope  ?                       was introduced through the mouth, and advanced to the  ?  efferent jejunal loop. The upper GI endoscopy was  ?                       accomplished without difficulty. The patient tolerated  ?                       the procedure well. ?Findings: ?     One benign-appearing, intrinsic severe (stenosis; an endoscope cannot  ?     pass) stenosis was found 35 cm from the incisors. This stenosis measured  ?     less than one cm (in length). The stenosis was traversed after dilation.  ?     A TTS dilator was passed through the scope. Dilation with an 06-17-09 mm  ?     balloon and a 08-20-11 mm balloon dilator was performed to 11 mm. The  ?     dilation site was examined  following endoscope reinsertion and showed  ?     moderate improvement in luminal narrowing. Estimated blood loss was  ?     minimal. Biopsies were taken with a cold forceps for histology. ?     Evidence of a Roux-en-Y gastrojejunostomy was found. The gastrojejunal  ?     anastomosis was characterized by healthy appearing mucosa. This was  ?     traversed. The pouch-to-jejunum limb was characterized by healthy  ?     appearing mucosa. Biopsies were taken with a cold forceps for histology. ?     Normal mucosa was found in the entire examined stomach pouch. Biopsies  ?     were taken with a cold forceps for histology. ?Impression:            - Benign-appearing esophageal stenosis. Dilated.  ?                       Biopsied. ?                       - Roux-en-Y gastrojejunostomy with gastrojejunal  ?                       anastomosis characterized by healthy appearing mucosa.  ?                       Biopsied. ?                       - Normal mucosa was found in the entire stomach.  ?                       Biopsied. ?Recommendation:        - Await pathology results. ?                       - Use Prilosec (omeprazole) 40 mg PO BID for 3 months. ?                       - Repeat upper endoscopy in 1 month for retreatment. ?                       - Return to my office in 2 weeks. ?                       -  Proceed with colonoscopy as scheduled ?                       See colonoscopy report ?Procedure Code(s):     --- Professional --- ?                       [phone], Esophagogastroduodenoscopy, flexible,  ?                       transoral; with transendoscopic balloon dilation of  ?                       esophagus (less than 30 mm diameter) ?                       [id number], 59,51, Esophagogastroduodenoscopy, flexible,  ?                       transoral; with biopsy, single or multiple ?Diagnosis Code(s):     --- Professional --- ?                       K22.2, Esophageal obstruction ?                       Z98.0, Intestinal bypass  and anastomosis status ?                       R13.14, Dysphagia, pharyngoesophageal phase ?                       R63.4, Abnormal weight loss ?CPT copyright 2019 American [employer] Association. All rights reserved. ?The codes documented in this report are preliminary and upon coder review may  ?be revised to meet current compliance requirements. ?Dr. Ulyess Mort ?Etheline Geppert Raeanne Gathers MD, MD ?03/23/2022 9:45:18 AM ?This report has been signed electronically. ?Number of Addenda: 0 ?Note Initiated On: 03/23/2022 9:07 AM ?Estimated Blood Loss:  Estimated blood loss was minimal. ?     [facility] ?

## 2022-03-23 NOTE — Op Note (Signed)
[facility] ?Gastroenterology ?Patient Name: Monica Leon ?Procedure Date: 03/23/2022 9:02 AM ?MRN: [id number] ?Account #: [id number] ?Date of Birth: [birth year] ?Admit Type: Outpatient ?Age: 51 ?Room: [facility] ENDO ROOM 1 ?Gender: Female ?Note Status: Finalized ?Instrument Name: Colonscope [id number] ?Procedure:             Colonoscopy ?Indications:           Screening for colorectal malignant neoplasm, This is  ?                       the patient's first colonoscopy ?Providers:             Lin Landsman MD, MD ?Medicines:             General Anesthesia ?Complications:         No immediate complications. Estimated blood loss: None. ?Procedure:             Pre-Anesthesia Assessment: ?                       - Prior to the procedure, a History and Physical was  ?                       performed, and patient medications and allergies were  ?                       reviewed. The patient is competent. The risks and  ?                       benefits of the procedure and the sedation options and  ?                       risks were discussed with the patient. All questions  ?                       were answered and informed consent was obtained.  ?                       Patient identification and proposed procedure were  ?                       verified by the physician, the nurse, the  ?                       anesthesiologist, the anesthetist and the technician  ?                       in the pre-procedure area in the procedure room in the  ?                       endoscopy suite. Mental Status Examination: alert and  ?                       oriented. Airway Examination: normal oropharyngeal  ?                       airway and neck mobility. Respiratory Examination:  ?                       clear to  auscultation. CV Examination: normal.  ?                       Prophylactic Antibiotics: The patient does not require  ?                       prophylactic antibiotics. Prior Anticoagulants: The  ?                        patient has taken no previous anticoagulant or  ?                       antiplatelet agents. ASA Grade Assessment: II - A  ?                       patient with mild systemic disease. After reviewing  ?                       the risks and benefits, the patient was deemed in  ?                       satisfactory condition to undergo the procedure. The  ?                       anesthesia plan was to use general anesthesia.  ?                       Immediately prior to administration of medications,  ?                       the patient was re-assessed for adequacy to receive  ?                       sedatives. The heart rate, respiratory rate, oxygen  ?                       saturations, blood pressure, adequacy of pulmonary  ?                       ventilation, and response to care were monitored  ?                       throughout the procedure. The physical status of the  ?                       patient was re-assessed after the procedure. ?                       After obtaining informed consent, the colonoscope was  ?                       passed under direct vision. Throughout the procedure,  ?                       the patient's blood pressure, pulse, and oxygen  ?                       saturations were monitored continuously. The  ?  Colonoscope was introduced through the anus and  ?                       advanced to the the terminal ileum, with  ?                       identification of the appendiceal orifice and IC  ?                       valve. The colonoscopy was performed without  ?                       difficulty. The patient tolerated the procedure well.  ?                       The quality of the bowel preparation was adequate to  ?                       identify polyps 6 mm and larger in size. ?Findings: ?     The perianal and digital rectal examinations were normal. Pertinent  ?     negatives include normal sphincter tone and no palpable rectal lesions. ?     The  terminal ileum appeared normal. ?     A 5 mm polyp was found in the transverse colon. The polyp was sessile.  ?     The polyp was removed with a cold snare. Resection and retrieval were  ?     complete. ?     A few diverticula were found in the sigmoid colon. ?     Non-bleeding external hemorrhoids were found during retroflexion. The  ?     hemorrhoids were medium-sized. ?     Normal mucosa was found in the entire colon. ?Impression:            - The examined portion of the ileum was normal. ?                       - One 5 mm polyp in the transverse colon, removed with  ?                       a cold snare. Resected and retrieved. ?                       - Diverticulosis in the sigmoid colon. ?                       - Non-bleeding external hemorrhoids. ?                       - Normal mucosa in the entire examined colon. ?Recommendation:        - Discharge patient to home (with escort). ?                       - Resume previous diet today. ?                       - Continue present medications. ?                       - Await pathology results. ?                       -  Repeat colonoscopy in 3 years for surveillance. ?Procedure Code(s):     --- Professional --- ?                       [phone], Colonoscopy, flexible; with removal of  ?                       tumor(s), polyp(s), or other lesion(s) by snare  ?                       technique ?Diagnosis Code(s):     --- Professional --- ?                       Z12.11, Encounter for screening for malignant neoplasm  ?                       of colon ?                       K64.4, Residual hemorrhoidal skin tags ?                       K63.5, Polyp of colon ?                       K57.30, Diverticulosis of large intestine without  ?                       perforation or abscess without bleeding ?CPT copyright 2019 American Medical Association. All rights reserved. ?The codes documented in this report are preliminary and upon coder review may  ?be revised to meet current  compliance requirements. ?Dr. Ulyess Mort ?Sheneika Walstad Raeanne Gathers MD, MD ?03/23/2022 10:03:06 AM ?This report has been signed electronically. ?Number of Addenda: 0 ?Note Initiated On: 03/23/2022 9:02 AM ?Scope Withdrawal Time: 0 hours 8 minutes 51 seconds  ?Total Procedure Duration: 0 hours 14 minutes 2 seconds  ?Estimated Blood Loss:  Estimated blood loss: none. ?     [facility] ?

## 2022-03-23 NOTE — Telephone Encounter (Signed)
Per EGD report patient is supposed to follow up in office in 2 weeks and have another EGD in 1 month  ?

## 2022-03-24 ENCOUNTER — Other Ambulatory Visit: Payer: Self-pay

## 2022-03-24 ENCOUNTER — Encounter: Payer: Self-pay | Admitting: Gastroenterology

## 2022-03-24 DIAGNOSIS — R1310 Dysphagia, unspecified: Secondary | ICD-10-CM

## 2022-03-24 LAB — SURGICAL PATHOLOGY

## 2022-03-24 NOTE — Anesthesia Postprocedure Evaluation (Signed)
Anesthesia Post Note ? ?Patient: Monica Leon ? ?Procedure(s) Performed: COLONOSCOPY WITH PROPOFOL ?ESOPHAGOGASTRODUODENOSCOPY (EGD) ? ?Patient location during evaluation: Endoscopy ?Anesthesia Type: General ?Level of consciousness: awake and alert ?Pain management: pain level controlled ?Vital Signs Assessment: post-procedure vital signs reviewed and stable ?Respiratory status: spontaneous breathing, nonlabored ventilation, respiratory function stable and patient connected to nasal cannula oxygen ?Cardiovascular status: blood pressure returned to baseline and stable ?Postop Assessment: no apparent nausea or vomiting ?Anesthetic complications: no ? ? ?No notable events documented. ? ? ?Last Vitals:  ?Vitals:  ? 03/23/22 1010 03/23/22 1020  ?BP: 114/81 134/85  ?Pulse:  66  ?Resp:  18  ?Temp:    ?SpO2:  100%  ?  ?Last Pain:  ?Vitals:  ? 03/23/22 1000  ?TempSrc: Temporal  ?PainSc:   ? ? ?  ?  ?  ?  ?  ?  ? ?Martha Clan ? ? ? ? ?

## 2022-03-24 NOTE — Telephone Encounter (Signed)
Patient states she is okay with schedule a follow up appointment and EGD. Schedule a follow up appointment for 04/08/2022 and EGD for 04/29/22. Went over instructions for EGD and mailed them to patient  ?

## 2022-03-24 NOTE — Telephone Encounter (Signed)
Pt returned call from yesterday ?

## 2022-03-24 NOTE — Telephone Encounter (Signed)
Tried to call patient and left a message for call back  ?

## 2022-03-27 ENCOUNTER — Encounter: Payer: Self-pay | Admitting: Internal Medicine

## 2022-03-27 ENCOUNTER — Ambulatory Visit (INDEPENDENT_AMBULATORY_CARE_PROVIDER_SITE_OTHER): Payer: BC Managed Care – PPO | Admitting: Internal Medicine

## 2022-03-27 VITALS — BP 130/89 | HR 75 | Temp 97.5°F | Ht 64.0 in | Wt 195.0 lb

## 2022-03-27 DIAGNOSIS — Z23 Encounter for immunization: Secondary | ICD-10-CM

## 2022-03-27 DIAGNOSIS — Z6833 Body mass index (BMI) 33.0-33.9, adult: Secondary | ICD-10-CM

## 2022-03-27 DIAGNOSIS — Z0001 Encounter for general adult medical examination with abnormal findings: Secondary | ICD-10-CM

## 2022-03-27 DIAGNOSIS — E6609 Other obesity due to excess calories: Secondary | ICD-10-CM

## 2022-03-27 DIAGNOSIS — E559 Vitamin D deficiency, unspecified: Secondary | ICD-10-CM | POA: Diagnosis not present

## 2022-03-27 NOTE — Addendum Note (Signed)
Addended by: Ashley Royalty E on: 03/27/2022 03:15 PM   Modules accepted: Orders

## 2022-03-27 NOTE — Assessment & Plan Note (Signed)
Encouraged diet and exercise for weight loss ?

## 2022-03-27 NOTE — Patient Instructions (Signed)
Health Maintenance for Postmenopausal Women Menopause is a normal process in which your ability to get pregnant comes to an end. This process happens slowly over many months or years, usually between the ages of 48 and 55. Menopause is complete when you have missed your menstrual period for 12 months. It is important to talk with your health care provider about some of the most common conditions that affect women after menopause (postmenopausal women). These include heart disease, cancer, and bone loss (osteoporosis). Adopting a healthy lifestyle and getting preventive care can help to promote your health and wellness. The actions you take can also lower your chances of developing some of these common conditions. What are the signs and symptoms of menopause? During menopause, you may have the following symptoms: Hot flashes. These can be moderate or severe. Night sweats. Decrease in sex drive. Mood swings. Headaches. Tiredness (fatigue). Irritability. Memory problems. Problems falling asleep or staying asleep. Talk with your health care provider about treatment options for your symptoms. Do I need hormone replacement therapy? Hormone replacement therapy is effective in treating symptoms that are caused by menopause, such as hot flashes and night sweats. Hormone replacement carries certain risks, especially as you become older. If you are thinking about using estrogen or estrogen with progestin, discuss the benefits and risks with your health care provider. How can I reduce my risk for heart disease and stroke? The risk of heart disease, heart attack, and stroke increases as you age. One of the causes may be a change in the body's hormones during menopause. This can affect how your body uses dietary fats, triglycerides, and cholesterol. Heart attack and stroke are medical emergencies. There are many things that you can do to help prevent heart disease and stroke. Watch your blood pressure High  blood pressure causes heart disease and increases the risk of stroke. This is more likely to develop in people who have high blood pressure readings or are overweight. Have your blood pressure checked: Every 3-5 years if you are 18-39 years of age. Every year if you are 40 years old or older. Eat a healthy diet  Eat a diet that includes plenty of vegetables, fruits, low-fat dairy products, and lean protein. Do not eat a lot of foods that are high in solid fats, added sugars, or sodium. Get regular exercise Get regular exercise. This is one of the most important things you can do for your health. Most adults should: Try to exercise for at least 150 minutes each week. The exercise should increase your heart rate and make you sweat (moderate-intensity exercise). Try to do strengthening exercises at least twice each week. Do these in addition to the moderate-intensity exercise. Spend less time sitting. Even light physical activity can be beneficial. Other tips Work with your health care provider to achieve or maintain a healthy weight. Do not use any products that contain nicotine or tobacco. These products include cigarettes, chewing tobacco, and vaping devices, such as e-cigarettes. If you need help quitting, ask your health care provider. Know your numbers. Ask your health care provider to check your cholesterol and your blood sugar (glucose). Continue to have your blood tested as directed by your health care provider. Do I need screening for cancer? Depending on your health history and family history, you may need to have cancer screenings at different stages of your life. This may include screening for: Breast cancer. Cervical cancer. Lung cancer. Colorectal cancer. What is my risk for osteoporosis? After menopause, you may be   at increased risk for osteoporosis. Osteoporosis is a condition in which bone destruction happens more quickly than new bone creation. To help prevent osteoporosis or  the bone fractures that can happen because of osteoporosis, you may take the following actions: If you are 19-50 years old, get at least 1,000 mg of calcium and at least 600 international units (IU) of vitamin D per day. If you are older than age 50 but younger than age 70, get at least 1,200 mg of calcium and at least 600 international units (IU) of vitamin D per day. If you are older than age 70, get at least 1,200 mg of calcium and at least 800 international units (IU) of vitamin D per day. Smoking and drinking excessive alcohol increase the risk of osteoporosis. Eat foods that are rich in calcium and vitamin D, and do weight-bearing exercises several times each week as directed by your health care provider. How does menopause affect my mental health? Depression may occur at any age, but it is more common as you become older. Common symptoms of depression include: Feeling depressed. Changes in sleep patterns. Changes in appetite or eating patterns. Feeling an overall lack of motivation or enjoyment of activities that you previously enjoyed. Frequent crying spells. Talk with your health care provider if you think that you are experiencing any of these symptoms. General instructions See your health care provider for regular wellness exams and vaccines. This may include: Scheduling regular health, dental, and eye exams. Getting and maintaining your vaccines. These include: Influenza vaccine. Get this vaccine each year before the flu season begins. Pneumonia vaccine. Shingles vaccine. Tetanus, diphtheria, and pertussis (Tdap) booster vaccine. Your health care provider may also recommend other immunizations. Tell your health care provider if you have ever been abused or do not feel safe at home. Summary Menopause is a normal process in which your ability to get pregnant comes to an end. This condition causes hot flashes, night sweats, decreased interest in sex, mood swings, headaches, or lack  of sleep. Treatment for this condition may include hormone replacement therapy. Take actions to keep yourself healthy, including exercising regularly, eating a healthy diet, watching your weight, and checking your blood pressure and blood sugar levels. Get screened for cancer and depression. Make sure that you are up to date with all your vaccines. This information is not intended to replace advice given to you by your health care provider. Make sure you discuss any questions you have with your health care provider. Document Revised: 03/17/2021 Document Reviewed: 03/17/2021 [employer] Patient Education  2023 [employer]  

## 2022-03-27 NOTE — Progress Notes (Signed)
Subjective:    Patient ID: Monica Leon, female    DOB: Nov 02, 1971, 51 y.o.   MRN: [id number]  HPI  Patient presents to clinic today for her annual exam.  Flu: 08/2021 Tetanus: unsure COVID: Moderna x2 Shingrix: 08/2021, 01/2022 Pap smear: > 5 years Mammogram: Ordered but not yet scheduled Colon screening: 03/2022 Vision screening: annually Dentist: biannually  Diet: She dis not currently eating meat. She consumes fruits but not veggies. She tries to avoid fried foods. She drinks mostly water. Exercise: Exercise  Review of Systems     Past Medical History:  Diagnosis Date   Anxiety    Depression    GERD (gastroesophageal reflux disease)    Hypertension    Migraine     Current Outpatient Medications  Medication Sig Dispense Refill   escitalopram (LEXAPRO) 20 MG tablet Take 20 mg by mouth daily.     fluticasone (FLOVENT HFA) 110 MCG/ACT inhaler Inhale 1 puff into the lungs 2 (two) times daily. 1 each 2   metoCLOPramide (REGLAN) 10 MG tablet Take 1 tablet (10 mg total) by mouth every 8 (eight) hours as needed for nausea. 30 tablet 0   Multiple Vitamin (MULTIVITAMIN) tablet Take 1 tablet by mouth daily.     omeprazole (PRILOSEC) 40 MG capsule Take 1 capsule (40 mg total) by mouth 2 (two) times daily before a meal. 60 capsule 3   ondansetron (ZOFRAN-ODT) 4 MG disintegrating tablet Take 1 tablet (4 mg total) by mouth every 8 (eight) hours as needed for nausea or vomiting. 30 tablet 0   Vitamin D, Ergocalciferol, (DRISDOL) 1.25 MG (50000 UNIT) CAPS capsule Take 1 capsule (50,000 Units total) by mouth every 7 (seven) days. 8 capsule 0   No current facility-administered medications for this visit.    No Known Allergies  No family history on file.  Social History   Socioeconomic History   Marital status: Single    Spouse name: Not on file   Number of children: Not on file   Years of education: Not on file   Highest education level: Not on file  Occupational  History   Not on file  Tobacco Use   Smoking status: Never   Smokeless tobacco: Never  Vaping Use   Vaping Use: Never used  Substance and Sexual Activity   Alcohol use: Yes    Alcohol/week: 6.0 standard drinks    Types: 6 Standard drinks or equivalent per week   Drug use: Never   Sexual activity: Not on file  Other Topics Concern   Not on file  Social History Narrative   Not on file   Social Determinants of Health   Financial Resource Strain: Not on file  Food Insecurity: Not on file  Transportation Needs: Not on file  Physical Activity: Not on file  Stress: Not on file  Social Connections: Not on file  Intimate Partner Violence: Not on file     Constitutional: Denies fever, malaise, fatigue, headache or abrupt weight changes.  HEENT: Denies eye pain, eye redness, ear pain, ringing in the ears, wax buildup, runny nose, nasal congestion, bloody nose, or sore throat. Respiratory: Denies difficulty breathing, shortness of breath, cough or sputum production.   Cardiovascular: Denies chest pain, chest tightness, palpitations or swelling in the hands or feet.  Gastrointestinal: Denies abdominal pain, bloating, constipation, diarrhea or blood in the stool.  GU: Denies urgency, frequency, pain with urination, burning sensation, blood in urine, odor or discharge. Musculoskeletal: Denies decrease in range of motion,  difficulty with gait, muscle pain or joint pain and swelling.  Skin: Denies redness, rashes, lesions or ulcercations.  Neurological: Denies dizziness, difficulty with memory, difficulty with speech or problems with balance and coordination.  Psych: Patient has a history of anxiety and depression.  Denies SI/HI.  No other specific complaints in a complete review of systems (except as listed in HPI above).  Objective:   Physical Exam  BP (!) 136/96 (BP Location: Left Arm, Patient Position: Sitting, Cuff Size: Normal)   Pulse 75   Temp (!) 97.5 F (36.4 C) (Temporal)    Ht 5' 4"  (1.626 m)   Wt 195 lb (88.5 kg)   SpO2 99%   BMI 33.47 kg/m   Wt Readings from Last 3 Encounters:  03/23/22 189 lb (85.7 kg)  03/16/22 184 lb (83.5 kg)  02/20/22 199 lb (90.3 kg)    General: Appears her stated age, obese, in NAD. Skin: Warm, dry and intact.  HEENT: Head: normal shape and size; Eyes: sclera white, no icterus, conjunctiva pink, PERRLA and EOMs intact;  Neck:  Neck supple, trachea midline. No masses, lumps or thyromegaly present.  Cardiovascular: Normal rate and rhythm. S1,S2 noted.  No murmur, rubs or gallops noted. No JVD or BLE edema. No carotid bruits noted. Pulmonary/Chest: Normal effort and positive vesicular breath sounds. No respiratory distress. No wheezes, rales or ronchi noted.  Abdomen: Soft and nontender. Normal bowel sounds. No distention or masses noted. Musculoskeletal: Strength 5/5 BUE/BLE. No difficulty with gait.  Neurological: Alert and oriented. Cranial nerves II-XII grossly intact. Coordination normal.  Psychiatric: Mood and affect normal. Behavior is normal. Judgment and thought content normal.    BMET    Component Value Date/Time   NA 139 03/16/2022 0656   K 3.3 (L) 03/16/2022 0656   CL 106 03/16/2022 0656   CO2 20 (L) 03/16/2022 0656   GLUCOSE 128 (H) 03/16/2022 0656   BUN <5 (L) 03/16/2022 0656   CREATININE 0.74 03/16/2022 0656   CREATININE 0.76 12/23/2021 1332   CALCIUM 9.4 03/16/2022 0656   GFRNONAA >60 03/16/2022 0656    Lipid Panel  No results found for: CHOL, TRIG, HDL, CHOLHDL, VLDL, LDLCALC  CBC    Component Value Date/Time   WBC 4.2 03/16/2022 0656   RBC 4.11 03/16/2022 0656   HGB 14.5 03/16/2022 0656   HCT 44.5 03/16/2022 0656   PLT 151 03/16/2022 0656   MCV 108.3 (H) 03/16/2022 0656   MCH 35.3 (H) 03/16/2022 0656   MCHC 32.6 03/16/2022 0656   RDW 11.9 03/16/2022 0656    Hgb A1C Lab Results  Component Value Date   HGBA1C 4.7 12/23/2021           Assessment & Plan:   Preventative Health  Maintenance:  Encouraged her to get a flu shot in the fall Tetanus today Encouraged her to get her COVID booster Shingrix UTD She declines pap smear today Mammogram has been ordered, she needs to call and get this scheduled Colon screening UTD Encouraged her to consume a balanced diet and exercise regimen Advised her to see an eye doctor and dentist annually   RTC in 6 months, follow-up chronic conditions  Webb Silversmith, NP

## 2022-04-08 ENCOUNTER — Other Ambulatory Visit: Payer: Self-pay

## 2022-04-08 ENCOUNTER — Encounter: Payer: Self-pay | Admitting: Gastroenterology

## 2022-04-08 ENCOUNTER — Ambulatory Visit: Payer: BC Managed Care – PPO | Admitting: Gastroenterology

## 2022-04-08 VITALS — BP 129/80 | HR 104 | Temp 98.8°F | Ht 64.0 in | Wt 187.1 lb

## 2022-04-08 DIAGNOSIS — K529 Noninfective gastroenteritis and colitis, unspecified: Secondary | ICD-10-CM

## 2022-04-08 DIAGNOSIS — R7989 Other specified abnormal findings of blood chemistry: Secondary | ICD-10-CM | POA: Diagnosis not present

## 2022-04-08 DIAGNOSIS — K222 Esophageal obstruction: Secondary | ICD-10-CM

## 2022-04-08 NOTE — Progress Notes (Signed)
Cephas Darby, MD [address]  [place]  [place], [place] [address]  Main: [phone]  Fax: [phone]    Gastroenterology Consultation  Referring Provider:     Jearld Fenton, NP Primary Care Physician:  Jearld Fenton, NP Primary Gastroenterologist:  Dr. Cephas Darby Reason for Consultation: Chronic diarrhea, gag        HPI:   Monica Leon is a 51 y.o. female referred by Dr. Jearld Fenton, NP  for consultation & management of chronic diarrhea.  Patient reports that she always had watery bowel movements.  However, over the past 1 year, she has been experiencing several episodes of watery bowel movements with occasional episodes of incontinence.  She also reports nocturnal diarrhea.  Patient denies any rectal bleeding, abdominal bloating.  She does report that most of these episodes occur after she eats.  She did not undergo any work-up for chronic diarrhea.  Patient had history of Roux-en-Y gastric bypass, she lost about 125 pounds.  Patient further lost about 12 pounds since March 2023 because of lack of appetite.  She also reports constant gag reflex.  She recently underwent upper endoscopy along with colonoscopy.  Upper endoscopy revealed esophageal stricture, dilated with balloon to 11 mm in size.  Patient is currently on soft diet.  Small bowel and gastric biopsies were unremarkable.  Patient denies any heartburn.  Patient did not fill in the prescription for omeprazole 40 mg twice daily.  She is currently taking omeprazole 20 mg tablets 2 pills daily in the morning.  Patient denies regular consumption of carbonated beverages, artificial sweeteners.  Patient denies having any red meat  NSAIDs: None  Antiplts/Anticoagulants/Anti thrombotics: None  GI Procedures: EGD and colonoscopy 03/23/2022 - Benign-appearing esophageal stenosis. Dilated to 11 mm. Biopsied. - Roux-en-Y gastrojejunostomy with gastrojejunal anastomosis characterized by healthy appearing  mucosa. Biopsied. - Normal mucosa was found in the entire stomach. Biopsied.  - The examined portion of the ileum was normal. - One 5 mm polyp in the transverse colon, removed with a cold snare. Resected and retrieved. - Diverticulosis in the sigmoid colon. - Non-bleeding external hemorrhoids. - Normal mucosa in the entire examined colon.  A. SMALL BOWEL; COLD BIOPSY:  - ENTERIC MUCOSA WITH PRESERVED VILLOUS ARCHITECTURE AND NO SIGNIFICANT  HISTOPATHOLOGIC CHANGE.  - NEGATIVE FOR FEATURES OF CELIAC, DYSPLASIA, AND MALIGNANCY.   B.  GASTRIC POUCH; COLD BIOPSY:  - GASTRIC ANTRAL AND OXYNTIC MUCOSA WITH NO SIGNIFICANT HISTOPATHOLOGIC  CHANGE.  - NEGATIVE FOR H. PYLORI, DYSPLASIA, AND MALIGNANCY.   C. ESOPHAGUS, RANDOM; COLD BIOPSY:  - BENIGN SQUAMOUS MUCOSA WITH NO SIGNIFICANT HISTOPATHOLOGIC CHANGE.  - NO INCREASE IN INTRAEPITHELIAL EOSINOPHILS (LESS THAN 2 PER HPF).  - NEGATIVE FOR DYSPLASIA AND   D. COLON POLYP, TRANSVERSE; COLD SNARE:  - TUBULAR ADENOMA.  - NEGATIVE FOR HIGH-GRADE DYSPLASIA AND MALIGNANCY.   Past Medical History:  Diagnosis Date   Anxiety    Depression    GERD (gastroesophageal reflux disease)    Hypertension    Migraine     Past Surgical History:  Procedure Laterality Date   COLONOSCOPY WITH PROPOFOL N/A 03/23/2022   Procedure: COLONOSCOPY WITH PROPOFOL;  Surgeon: Lin Landsman, MD;  Location: [facility] ENDOSCOPY;  Service: Gastroenterology;  Laterality: N/A;   ESOPHAGOGASTRODUODENOSCOPY N/A 03/23/2022   Procedure: ESOPHAGOGASTRODUODENOSCOPY (EGD);  Surgeon: Lin Landsman, MD;  Location: [facility] ENDOSCOPY;  Service: Gastroenterology;  Laterality: N/A;   GASTRIC BYPASS  2008    Current Outpatient Medications:  escitalopram (LEXAPRO) 20 MG tablet, Take 20 mg by mouth daily., Disp: , Rfl:    fluticasone (FLOVENT HFA) 110 MCG/ACT inhaler, Inhale 1 puff into the lungs 2 (two) times daily., Disp: 1 each, Rfl: 2   metoCLOPramide (REGLAN) 10 MG tablet,  Take 1 tablet (10 mg total) by mouth every 8 (eight) hours as needed for nausea., Disp: 30 tablet, Rfl: 0   Multiple Vitamin (MULTIVITAMIN) tablet, Take 1 tablet by mouth daily., Disp: , Rfl:    omeprazole (PRILOSEC) 40 MG capsule, Take 1 capsule (40 mg total) by mouth 2 (two) times daily before a meal., Disp: 60 capsule, Rfl: 3   ondansetron (ZOFRAN-ODT) 4 MG disintegrating tablet, Take 1 tablet (4 mg total) by mouth every 8 (eight) hours as needed for nausea or vomiting., Disp: 30 tablet, Rfl: 0   Family History  Problem Relation Age of Onset   Healthy Mother    Prostate cancer Father      Social History   Tobacco Use   Smoking status: Never   Smokeless tobacco: Never  Vaping Use   Vaping Use: Never used  Substance Use Topics   Alcohol use: Yes    Alcohol/week: 6.0 standard drinks    Types: 6 Standard drinks or equivalent per week   Drug use: Never    Allergies as of 04/08/2022   (No Known Allergies)    Review of Systems:    All systems reviewed and negative except where noted in HPI.   Physical Exam:  BP 129/80 (BP Location: Left Arm, Patient Position: Sitting, Cuff Size: Normal)   Pulse (!) 104   Temp 98.8 F (37.1 C) (Oral)   Ht 5' 4"  (1.626 m)   Wt 187 lb 2 oz (84.9 kg)   BMI 32.12 kg/m  No LMP recorded. Patient is postmenopausal.  General:   Alert,  Well-developed, well-nourished, pleasant and cooperative in NAD Head:  Normocephalic and atraumatic. Eyes:  Sclera clear, no icterus.   Conjunctiva pink. Ears:  Normal auditory acuity. Nose:  No deformity, discharge, or lesions. Mouth:  No deformity or lesions,oropharynx pink & moist. Neck:  Supple; no masses or thyromegaly. Lungs:  Respirations even and unlabored.  Clear throughout to auscultation.   No wheezes, crackles, or rhonchi. No acute distress. Heart:  Regular rate and rhythm; no murmurs, clicks, rubs, or gallops. Abdomen:  Normal bowel sounds. Soft, non-tender and non-distended without masses,  hepatosplenomegaly or hernias noted.  No guarding or rebound tenderness.   Rectal: Not performed Msk:  Symmetrical without gross deformities. Good, equal movement & strength bilaterally. Pulses:  Normal pulses noted. Extremities:  No clubbing or edema.  No cyanosis. Neurologic:  Alert and oriented x3;  grossly normal neurologically. Skin:  Intact without significant lesions or rashes. No jaundice. Psych:  Alert and cooperative. Normal mood and affect.  Imaging Studies: Reviewed  Assessment and Plan:   Monica Leon is a 51 y.o. pleasant Caucasian female with history of morbid obesity s/p Roux-en-Y gastric bypass, hypertension, fatty liver is seen in consultation for chronic diarrhea and frequent gag reflex secondary to esophageal stricture  Chronic diarrhea of unknown origin Recommend GI profile PCR to rule out infection Check pancreatic fecal elastase levels and fecal calprotectin levels Colonoscopy with TI evaluation was unremarkable.  Random colon biopsies were not performed Small bowel biopsies and gastric biopsies were unremarkable If above work-up is negative, will empirically treat for bacterial overgrowth.  If patient does not respond, recommend sigmoidoscopy with random colon biopsies to rule out  any lymphocytic colitis Patient is currently taking Lomotil which helps with diarrhea  Benign lower esophageal stricture/stenosis Patient is scheduled for repeat upper endoscopy on 6/21 Recommend omeprazole 40 mg p.o. twice daily before meals Discussed about finely chopped food, avoid hard meats  Elevated LFTs: Patient has chronically elevated LFTs, initially noted to have mildly elevated transaminases in 2022.  Most recently detected in 01/2022 and repeat LFTs were abnormal 3 weeks ago AST 231, ALT 130, alkaline phosphatase 134, T. bili 2.7.  Right upper quadrant ultrasound revealed coarse echogenic hepatic parenchyma Recommend to repeat LFTs, check viral hepatitis panel and  secondary liver disease work-up Recommend complete abstinence from alcohol use, patient drinks about 6 drinks per week  Follow up in 3 to 4 months   Cephas Darby, MD

## 2022-04-15 DIAGNOSIS — K529 Noninfective gastroenteritis and colitis, unspecified: Secondary | ICD-10-CM | POA: Diagnosis not present

## 2022-04-16 LAB — GI PROFILE, STOOL, PCR

## 2022-04-17 ENCOUNTER — Encounter: Payer: Self-pay | Admitting: Gastroenterology

## 2022-04-21 ENCOUNTER — Other Ambulatory Visit: Payer: Self-pay | Admitting: Gastroenterology

## 2022-04-21 DIAGNOSIS — R7989 Other specified abnormal findings of blood chemistry: Secondary | ICD-10-CM | POA: Diagnosis not present

## 2022-04-22 ENCOUNTER — Telehealth: Payer: Self-pay

## 2022-04-22 ENCOUNTER — Other Ambulatory Visit: Payer: Self-pay

## 2022-04-22 DIAGNOSIS — R7989 Other specified abnormal findings of blood chemistry: Secondary | ICD-10-CM

## 2022-04-22 LAB — HEPATIC FUNCTION PANEL
ALT: 135 IU/L — ABNORMAL HIGH (ref 0–32)
AST: 223 IU/L — ABNORMAL HIGH (ref 0–40)
Albumin: 3.4 g/dL — ABNORMAL LOW (ref 3.8–4.8)
Alkaline Phosphatase: 163 IU/L — ABNORMAL HIGH (ref 44–121)
Bilirubin Total: 1.7 mg/dL — ABNORMAL HIGH (ref 0.0–1.2)
Bilirubin, Direct: 0.87 mg/dL — ABNORMAL HIGH (ref 0.00–0.40)
Total Protein: 6.3 g/dL (ref 6.0–8.5)

## 2022-04-22 NOTE — Telephone Encounter (Signed)
Pt notified of GI profile results. Pt stated her diarrhea has improved. She is not having it like before. It is much better.

## 2022-04-22 NOTE — Telephone Encounter (Signed)
-----   Message from Lin Landsman, MD sent at 04/17/2022  3:04 PM EDT ----- Please inform patient about E. coli positive on her stool studies.  Please check with patient if she is still having diarrhea  RV

## 2022-04-23 DIAGNOSIS — R7989 Other specified abnormal findings of blood chemistry: Secondary | ICD-10-CM | POA: Diagnosis not present

## 2022-04-23 LAB — PANCREATIC ELASTASE, FECAL: Pancreatic Elastase, Fecal: >500 ug Elast./g (ref >200)

## 2022-04-24 ENCOUNTER — Other Ambulatory Visit: Payer: Self-pay | Admitting: Gastroenterology

## 2022-04-24 DIAGNOSIS — R7989 Other specified abnormal findings of blood chemistry: Secondary | ICD-10-CM

## 2022-04-24 LAB — HEPATIC FUNCTION PANEL
ALT: 128 IU/L — ABNORMAL HIGH (ref 0–32)
AST: 218 IU/L — ABNORMAL HIGH (ref 0–40)
Albumin: 3.7 g/dL — ABNORMAL LOW (ref 3.8–4.9)
Alkaline Phosphatase: 174 IU/L — ABNORMAL HIGH (ref 44–121)
Bilirubin Total: 1.5 mg/dL — ABNORMAL HIGH (ref 0.0–1.2)
Bilirubin, Direct: 0.84 mg/dL — ABNORMAL HIGH (ref 0.00–0.40)
Total Protein: 6.7 g/dL (ref 6.0–8.5)

## 2022-04-25 LAB — CALPROTECTIN, FECAL: Calprotectin, Fecal: 16 ug/g (ref 0–120)

## 2022-04-27 ENCOUNTER — Encounter: Payer: Self-pay | Admitting: Gastroenterology

## 2022-04-27 ENCOUNTER — Other Ambulatory Visit: Payer: Self-pay

## 2022-04-27 ENCOUNTER — Telehealth: Payer: Self-pay

## 2022-04-27 DIAGNOSIS — R7989 Other specified abnormal findings of blood chemistry: Secondary | ICD-10-CM

## 2022-04-27 LAB — COPPER, SERUM: Copper: 99 ug/dL (ref 80–158)

## 2022-04-27 LAB — HEPATITIS B SURFACE ANTIBODY,QUALITATIVE: Hep B Surface Ab, Qual: NONREACTIVE

## 2022-04-27 LAB — IRON,TIBC AND FERRITIN PANEL
Ferritin: 1660 ng/mL — ABNORMAL HIGH (ref 15–150)
Iron Saturation: 62 % — ABNORMAL HIGH (ref 15–55)
Iron: 109 ug/dL (ref 27–159)
Total Iron Binding Capacity: 175 ug/dL — ABNORMAL LOW (ref 250–450)
UIBC: 66 ug/dL — ABNORMAL LOW (ref 131–425)

## 2022-04-27 LAB — HEPATITIS B SURFACE ANTIGEN: Hepatitis B Surface Ag: NEGATIVE

## 2022-04-27 LAB — HEPATITIS B CORE ANTIBODY, TOTAL: Hep B Core Total Ab: NEGATIVE

## 2022-04-27 LAB — CELIAC DISEASE PANEL
Endomysial IgA: NEGATIVE
IgA/Immunoglobulin A, Serum: 347 mg/dL (ref 87–352)
Transglutaminase IgA: <2 U/mL (ref 0–3)

## 2022-04-27 LAB — HIV ANTIBODY (ROUTINE TESTING W REFLEX): HIV Screen 4th Generation wRfx: NONREACTIVE

## 2022-04-27 LAB — HEPATITIS C ANTIBODY: Hep C Virus Ab: NONREACTIVE

## 2022-04-27 LAB — HEPATITIS A ANTIBODY, TOTAL: hep A Total Ab: NEGATIVE

## 2022-04-27 NOTE — Telephone Encounter (Signed)
Patient verbalized understanding and will get lab done

## 2022-04-27 NOTE — Telephone Encounter (Signed)
-----   Message from Lin Landsman, MD sent at 04/24/2022 11:45 AM EDT ----- Please inform patient that her iron levels are elevated and recommend to rule out hereditary hemochromatosis.  Ordered the lab, please let her know  RV

## 2022-04-28 DIAGNOSIS — R7989 Other specified abnormal findings of blood chemistry: Secondary | ICD-10-CM | POA: Diagnosis not present

## 2022-04-29 ENCOUNTER — Encounter: Payer: Self-pay | Admitting: Gastroenterology

## 2022-04-29 ENCOUNTER — Ambulatory Visit: Payer: BC Managed Care – PPO | Admitting: Anesthesiology

## 2022-04-29 ENCOUNTER — Other Ambulatory Visit: Payer: Self-pay

## 2022-04-29 ENCOUNTER — Ambulatory Visit
Admission: RE | Admit: 2022-04-29 | Discharge: 2022-04-29 | Disposition: A | Discharge status: Home / Self Care | Payer: BC Managed Care – PPO | Attending: Gastroenterology | Admitting: Gastroenterology

## 2022-04-29 ENCOUNTER — Ambulatory Visit
Admission: RE | Admit: 2022-04-29 | Discharge: 2022-04-29 | Disposition: A | Discharge status: Home / Self Care | Payer: BC Managed Care – PPO | Source: Ambulatory Visit | Attending: Internal Medicine | Admitting: Internal Medicine

## 2022-04-29 ENCOUNTER — Encounter
Admission: RE | Disposition: A | Discharge status: Home / Self Care | Payer: Self-pay | Source: Home / Self Care | Attending: Gastroenterology

## 2022-04-29 DIAGNOSIS — F419 Anxiety disorder, unspecified: Secondary | ICD-10-CM | POA: Diagnosis not present

## 2022-04-29 DIAGNOSIS — K219 Gastro-esophageal reflux disease without esophagitis: Secondary | ICD-10-CM | POA: Insufficient documentation

## 2022-04-29 DIAGNOSIS — Z9884 Bariatric surgery status: Secondary | ICD-10-CM | POA: Diagnosis not present

## 2022-04-29 DIAGNOSIS — Z980 Intestinal bypass and anastomosis status: Secondary | ICD-10-CM | POA: Diagnosis not present

## 2022-04-29 DIAGNOSIS — R1314 Dysphagia, pharyngoesophageal phase: Secondary | ICD-10-CM | POA: Diagnosis not present

## 2022-04-29 DIAGNOSIS — I10 Essential (primary) hypertension: Secondary | ICD-10-CM | POA: Diagnosis not present

## 2022-04-29 DIAGNOSIS — Z1231 Encounter for screening mammogram for malignant neoplasm of breast: Secondary | ICD-10-CM | POA: Diagnosis not present

## 2022-04-29 DIAGNOSIS — F32A Depression, unspecified: Secondary | ICD-10-CM | POA: Insufficient documentation

## 2022-04-29 DIAGNOSIS — R1310 Dysphagia, unspecified: Secondary | ICD-10-CM

## 2022-04-29 DIAGNOSIS — K222 Esophageal obstruction: Secondary | ICD-10-CM | POA: Diagnosis not present

## 2022-04-29 DIAGNOSIS — Z79899 Other long term (current) drug therapy: Secondary | ICD-10-CM | POA: Diagnosis not present

## 2022-04-29 HISTORY — PX: ESOPHAGOGASTRODUODENOSCOPY (EGD) WITH PROPOFOL: SHX5813

## 2022-04-29 SURGERY — ESOPHAGOGASTRODUODENOSCOPY (EGD) WITH PROPOFOL
Anesthesia: General

## 2022-04-29 MED ORDER — DEXMEDETOMIDINE HCL IN NACL 80 MCG/20ML IV SOLN
INTRAVENOUS | Status: AC
  Filled 2022-04-29: qty 20

## 2022-04-29 MED ORDER — PHENYLEPHRINE 80 MCG/ML (10ML) SYRINGE FOR IV PUSH (FOR BLOOD PRESSURE SUPPORT)
PREFILLED_SYRINGE | INTRAVENOUS | Status: AC
  Filled 2022-04-29: qty 10

## 2022-04-29 MED ORDER — SODIUM CHLORIDE 0.9 % IV SOLN: INTRAVENOUS | Status: DC

## 2022-04-29 MED ORDER — PROPOFOL 500 MG/50ML IV EMUL
INTRAVENOUS | Status: DC | PRN
  Administered 2022-04-29: 75 ug/kg/min via INTRAVENOUS

## 2022-04-29 MED ORDER — PROPOFOL 1000 MG/100ML IV EMUL
INTRAVENOUS | Status: AC
  Filled 2022-04-29: qty 100

## 2022-04-29 MED ORDER — PROPOFOL 10 MG/ML IV BOLUS
INTRAVENOUS | Status: DC | PRN
  Administered 2022-04-29: 50 mg via INTRAVENOUS

## 2022-04-29 MED ORDER — LIDOCAINE HCL (PF) 2 % IJ SOLN
INTRAMUSCULAR | Status: AC
  Filled 2022-04-29: qty 5

## 2022-04-29 MED ORDER — LIDOCAINE HCL (CARDIAC) PF 100 MG/5ML IV SOSY
PREFILLED_SYRINGE | INTRAVENOUS | Status: DC | PRN
  Administered 2022-04-29: 80 mg via INTRAVENOUS

## 2022-04-29 MED ORDER — MIDAZOLAM HCL 2 MG/2ML IJ SOLN
INTRAMUSCULAR | Status: AC
  Filled 2022-04-29: qty 2

## 2022-04-29 MED ORDER — DEXMEDETOMIDINE HCL IN NACL 200 MCG/50ML IV SOLN
INTRAVENOUS | Status: DC | PRN
  Administered 2022-04-29: 12 ug via INTRAVENOUS
  Administered 2022-04-29: 8 ug via INTRAVENOUS

## 2022-04-29 MED ORDER — MIDAZOLAM HCL 2 MG/2ML IJ SOLN
INTRAMUSCULAR | Status: DC | PRN
  Administered 2022-04-29: 2 mg via INTRAVENOUS

## 2022-04-29 MED ORDER — PHENYLEPHRINE HCL (PRESSORS) 10 MG/ML IV SOLN
INTRAVENOUS | Status: DC | PRN
  Administered 2022-04-29 (×2): 80 ug via INTRAVENOUS
  Administered 2022-04-29: 160 ug via INTRAVENOUS

## 2022-04-29 MED ORDER — FENTANYL CITRATE (PF) 100 MCG/2ML IJ SOLN
INTRAMUSCULAR | Status: AC
  Filled 2022-04-29: qty 2

## 2022-04-29 NOTE — Op Note (Signed)
[facility]astroenter[facility] Patient Name: Monica Leon Procedure Date: 04/29/2022 7:23 AM MRN: [id number] Account #: [id number] Date of Birth: 11-02-1971 Admit Type: Outpatient Age: 51 Room: [facility] ENDO ROOM 4 Gender: Female Note Status: Finalized Instrument Name: Upper Endoscope [id number] Procedure:             Upper GI endoscopy Indications:           Esophageal dysphagia, Follow-up of esophageal                         stricture, For therapy of esophageal stricture Providers:             Lin Landsman MD, MD Referring MD:          Jearld Fenton (Referring MD) Medicines:             General Anesthesia Complications:         No immediate complications. Estimated blood loss:                         Minimal. Procedure:             Pre-Anesthesia Assessment:                        - Prior to the procedure, a History and Physical was                         performed, and patient medications and allergies were                         reviewed. The patient is competent. The risks and                         benefits of the procedure and the sedation options and                         risks were discussed with the patient. All questions                         were answered and informed consent was obtained.                         Patient identification and proposed procedure were                         verified by the physician, the nurse, the                         anesthesiologist, the anesthetist and the technician                         in the pre-procedure area in the procedure room in the                         endoscopy suite. Mental Status Examination: alert and                         oriented. Airway Examination: normal oropharyngeal  airway and neck mobility. Respiratory Examination:                         clear to auscultation. CV Examination: normal.                         Prophylactic Antibiotics: The patient does  not require                         prophylactic antibiotics. Prior Anticoagulants: The                         patient has taken no previous anticoagulant or                         antiplatelet agents. ASA Grade Assessment: II - A                         patient with mild systemic disease. After reviewing                         the risks and benefits, the patient was deemed in                         satisfactory condition to undergo the procedure. The                         anesthesia plan was to use general anesthesia.                         Immediately prior to administration of medications,                         the patient was re-assessed for adequacy to receive                         sedatives. The heart rate, respiratory rate, oxygen                         saturations, blood pressure, adequacy of pulmonary                         ventilation, and response to care were monitored                         throughout the procedure. The physical status of the                         patient was re-assessed after the procedure.                        After obtaining informed consent, the endoscope was                         passed under direct vision. Throughout the procedure,                         the patient's blood pressure, pulse, and oxygen  saturations were monitored continuously. The Endoscope                         was introduced through the mouth, and advanced to the                         efferent jejunal loop. The upper GI endoscopy was                         accomplished without difficulty. The patient tolerated                         the procedure well. Findings:      Evidence of a Roux-en-Y gastrojejunostomy was found. The gastrojejunal       anastomosis was characterized by healthy appearing mucosa. This was       traversed. The pouch-to-jejunum limb was characterized by healthy       appearing mucosa. The jejunojejunal anastomosis was  characterized by       healthy appearing mucosa. The duodenum-to-jejunum limb was not examined       as it could not be traversed.      One benign-appearing, intrinsic moderate (circumferential scarring or       stenosis; an endoscope may pass) stenosis was found 35 cm from the       incisors. This stenosis measured less than one cm (in length). The       stenosis was traversed. A TTS dilator was passed through the scope.       Dilation with a 12-13.5-15 mm balloon dilator was performed to 15 mm.       The dilation site was examined following endoscope reinsertion and       showed moderate mucosal disruption and complete resolution of luminal       narrowing. Estimated blood loss was minimal. Impression:            - Roux-en-Y gastrojejunostomy with gastrojejunal                         anastomosis characterized by healthy appearing mucosa.                        - Benign-appearing esophageal stenosis. Dilated.                        - No specimens collected. Recommendation:        - Discharge patient to home (with escort).                        - Chopped diet.                        - Continue present medications.                        - Follow an antireflux regimen indefinitely.                        - Use Prilosec (omeprazole) 40 mg PO BID for 3 months.                        - Return to my office as  previously scheduled. Procedure Code(s):     --- Professional ---                        [phone], Esophagogastroduodenoscopy, flexible,                         transoral; with transendoscopic balloon dilation of                         esophagus (less than 30 mm diameter) Diagnosis Code(s):     --- Professional ---                        Z98.0, Intestinal bypass and anastomosis status                        K22.2, Esophageal obstruction                        R13.14, Dysphagia, pharyngoesophageal phase CPT copyright 2019 [employer]. All rights reserved. The codes  documented in this report are preliminary and upon coder review may  be revised to meet current compliance requirements. Dr. Ulyess Mort Lin Landsman MD, MD 04/29/2022 8:09:50 AM This report has been signed electronically. Number of Addenda: 0 Note Initiated On: 04/29/2022 7:23 AM Estimated Blood Loss:  Estimated blood loss was minimal.      [facility]

## 2022-04-29 NOTE — Transfer of Care (Signed)
Immediate Anesthesia Transfer of Care Note  Patient: Monica Leon  Procedure(s) Performed: ESOPHAGOGASTRODUODENOSCOPY (EGD) WITH PROPOFOL  Patient Location: PACU  Anesthesia Type:General  Level of Consciousness: sedated  Airway & Oxygen Therapy: Patient Spontanous Breathing and Patient connected to nasal cannula oxygen  Post-op Assessment: Report given to RN and Post -op Vital signs reviewed and stable  Post vital signs: Reviewed and stable  Last Vitals:  Vitals Value Taken Time  BP 79/41 04/29/22 0809  Temp    Pulse 75 04/29/22 0810  Resp 21 04/29/22 0810  SpO2 100 % 04/29/22 0810  Vitals shown include unvalidated device data.  Last Pain:  Vitals:   04/29/22 0808  TempSrc:   PainSc: 0-No pain         Complications: No notable events documented.

## 2022-04-29 NOTE — H&P (Signed)
Cephas Darby, MD [address]  [place]  [place], [place] [address]  Main: [phone]  Fax: [phone] Pager: [phone]  Primary Care Physician:  Jearld Fenton, NP Primary Gastroenterologist:  Dr. Cephas Darby  Pre-Procedure History & Physical: HPI:  Monica Leon is a 51 y.o. female is here for an endoscopy.   Past Medical History:  Diagnosis Date   Anxiety    Depression    GERD (gastroesophageal reflux disease)    Hypertension    Migraine     Past Surgical History:  Procedure Laterality Date   COLONOSCOPY WITH PROPOFOL N/A 03/23/2022   Procedure: COLONOSCOPY WITH PROPOFOL;  Surgeon: Lin Landsman, MD;  Location: [facility] ENDOSCOPY;  Service: Gastroenterology;  Laterality: N/A;   ESOPHAGOGASTRODUODENOSCOPY N/A 03/23/2022   Procedure: ESOPHAGOGASTRODUODENOSCOPY (EGD);  Surgeon: Lin Landsman, MD;  Location: [facility] ENDOSCOPY;  Service: Gastroenterology;  Laterality: N/A;   GASTRIC BYPASS  2008    Prior to Admission medications   Medication Sig Start Date End Date Taking? Authorizing Provider  escitalopram (LEXAPRO) 20 MG tablet Take 20 mg by mouth daily. 07/30/21  Yes [provider]  fluticasone (FLOVENT HFA) 110 MCG/ACT inhaler Inhale 1 puff into the lungs 2 (two) times daily. 02/20/22  Yes Baity, Coralie Keens, NP  metoCLOPramide (REGLAN) 10 MG tablet Take 1 tablet (10 mg total) by mouth every 8 (eight) hours as needed for nausea. 03/16/22 03/16/23 Yes Vladimir Crofts, MD  Multiple Vitamin (MULTIVITAMIN) tablet Take 1 tablet by mouth daily.   Yes [provider]  omeprazole (PRILOSEC) 40 MG capsule Take 1 capsule (40 mg total) by mouth 2 (two) times daily before a meal. 03/23/22 07/21/22 Yes Ashlinn Hemrick, Tally Due, MD  ondansetron (ZOFRAN-ODT) 4 MG disintegrating tablet Take 1 tablet (4 mg total) by mouth every 8 (eight) hours as needed for nausea or vomiting. 03/20/22  Yes Jearld Fenton, NP    Allergies as of 03/24/2022   (No Known Allergies)     Family History  Problem Relation Age of Onset   Healthy Mother    Prostate cancer Father     Social History   Socioeconomic History   Marital status: Single    Spouse name: Not on file   Number of children: Not on file   Years of education: Not on file   Highest education level: Not on file  Occupational History   Not on file  Tobacco Use   Smoking status: Never   Smokeless tobacco: Never  Vaping Use   Vaping Use: Never used  Substance and Sexual Activity   Alcohol use: Yes    Alcohol/week: 6.0 standard drinks of alcohol    Types: 6 Standard drinks or equivalent per week   Drug use: Never   Sexual activity: Not on file  Other Topics Concern   Not on file  Social History Narrative   Not on file   Social Determinants of Health   Financial Resource Strain: Not on file  Food Insecurity: Not on file  Transportation Needs: Not on file  Physical Activity: Not on file  Stress: Not on file  Social Connections: Not on file  Intimate Partner Violence: Not on file    Review of Systems: See HPI, otherwise negative ROS  Physical Exam: BP (!) 139/92   Pulse (!) 105   Temp (!) 96.5 F (35.8 C) (Temporal)   Resp 16   Ht 5' 4"  (1.626 m)   Wt 82.6 kg   SpO2 99%  BMI 31.24 kg/m  General:   Alert,  pleasant and cooperative in NAD Head:  Normocephalic and atraumatic. Neck:  Supple; no masses or thyromegaly. Lungs:  Clear throughout to auscultation.    Heart:  Regular rate and rhythm. Abdomen:  Soft, nontender and nondistended. Normal bowel sounds, without guarding, and without rebound.   Neurologic:  Alert and  oriented x4;  grossly normal neurologically.  Impression/Plan: Monica Leon is here for an endoscopy to be performed for esophageal stricture  Risks, benefits, limitations, and alternatives regarding  endoscopy have been reviewed with the patient.  Questions have been answered.  All parties agreeable.   Sherri Sear, MD  04/29/2022, 7:44 AM

## 2022-04-29 NOTE — Anesthesia Postprocedure Evaluation (Signed)
Anesthesia Post Note  Patient: Viriginia Amendola  Procedure(s) Performed: ESOPHAGOGASTRODUODENOSCOPY (EGD) WITH PROPOFOL  Patient location during evaluation: PACU Anesthesia Type: General Level of consciousness: awake and alert Pain management: pain level controlled Vital Signs Assessment: post-procedure vital signs reviewed and stable Respiratory status: spontaneous breathing, nonlabored ventilation, respiratory function stable and patient connected to nasal cannula oxygen Cardiovascular status: blood pressure returned to baseline and stable Postop Assessment: no apparent nausea or vomiting Anesthetic complications: no   No notable events documented.   Last Vitals:  Vitals:   04/29/22 0810 04/29/22 0813  BP: (!) 85/52 116/62  Pulse:    Resp:    Temp: (!) 36.1 C   SpO2:      Last Pain:  Vitals:   04/29/22 0838  TempSrc:   PainSc: 0-No pain                 Molli Barrows

## 2022-04-29 NOTE — Anesthesia Preprocedure Evaluation (Signed)
Anesthesia Evaluation  Patient identified by MRN, date of birth, ID band Patient awake    Reviewed: Allergy & Precautions, H&P , NPO status , Patient's Chart, lab work & pertinent test results, reviewed documented beta blocker date and time   Airway Mallampati: II   Neck ROM: full    Dental  (+) Poor Dentition   Pulmonary neg pulmonary ROS,    Pulmonary exam normal        Cardiovascular Exercise Tolerance: Good hypertension, On Medications negative cardio ROS Normal cardiovascular exam Rhythm:regular Rate:Normal     Neuro/Psych  Headaches, Anxiety Depression negative psych ROS   GI/Hepatic Neg liver ROS, GERD  Medicated,  Endo/Other  negative endocrine ROS  Renal/GU negative Renal ROS  negative genitourinary   Musculoskeletal   Abdominal   Peds  Hematology negative hematology ROS (+)   Anesthesia Other Findings Past Medical History: No date: Anxiety No date: Depression No date: GERD (gastroesophageal reflux disease) No date: Hypertension No date: Migraine Past Surgical History: 03/23/2022: COLONOSCOPY WITH PROPOFOL; N/A     Comment:  Procedure: COLONOSCOPY WITH PROPOFOL;  Surgeon: Lin Landsman, MD;  Location: [facility] ENDOSCOPY;  Service:               Gastroenterology;  Laterality: N/A; 03/23/2022: ESOPHAGOGASTRODUODENOSCOPY; N/A     Comment:  Procedure: ESOPHAGOGASTRODUODENOSCOPY (EGD);  Surgeon:               Lin Landsman, MD;  Location: [facility] ENDOSCOPY;                Service: Gastroenterology;  Laterality: N/A; 2008: GASTRIC BYPASS BMI    Body Mass Index: 31.24 kg/m     Reproductive/Obstetrics negative OB ROS                             Anesthesia Physical Anesthesia Plan  ASA: 2  Anesthesia Plan: General   Post-op Pain Management:    Induction:   PONV Risk Score and Plan:   Airway Management Planned:   Additional Equipment:   Intra-op  Plan:   Post-operative Plan:   Informed Consent: I have reviewed the patients History and Physical, chart, labs and discussed the procedure including the risks, benefits and alternatives for the proposed anesthesia with the patient or authorized representative who has indicated his/her understanding and acceptance.     Dental Advisory Given  Plan Discussed with: CRNA  Anesthesia Plan Comments:         Anesthesia Quick Evaluation

## 2022-05-01 ENCOUNTER — Encounter: Payer: Self-pay | Admitting: Gastroenterology

## 2022-05-04 LAB — HEMOCHROMATOSIS DNA-PCR(C282Y,H63D)

## 2022-05-05 ENCOUNTER — Telehealth: Payer: Self-pay

## 2022-05-05 DIAGNOSIS — R7989 Other specified abnormal findings of blood chemistry: Secondary | ICD-10-CM

## 2022-05-06 NOTE — Telephone Encounter (Signed)
Patient is schedule for 05/22/2022 8:30 AM arrive to medical mall at 7:30am informed patient by [employer]

## 2022-05-19 ENCOUNTER — Other Ambulatory Visit: Payer: Self-pay

## 2022-05-19 NOTE — Progress Notes (Signed)
Attempted pre-call to give pre-procedure instructions 05/19/22 @ 11:30 am. No answer and patient's VM has not been set up. Will attempt again tomorrow.

## 2022-05-21 ENCOUNTER — Other Ambulatory Visit: Payer: Self-pay | Admitting: Physician Assistant

## 2022-05-21 ENCOUNTER — Telehealth: Payer: Self-pay

## 2022-05-21 DIAGNOSIS — R7989 Other specified abnormal findings of blood chemistry: Secondary | ICD-10-CM

## 2022-05-21 NOTE — H&P (Addendum)
Chief Complaint: Elevated LFT's. Request is for liver biopsy.   Referring Physician(s): Lin Landsman  Supervising Physician: Ruthann Cancer  Patient Status: [facility] - Out-pt  History of Present Illness: Monica Leon is a 51 y.o. female outpatient. History of HTN, GERD, anxiety. Found to have elevated LFT's. Team is requesting kidney biopsy for further evaluation.   Currently without any significant complaints. Patient alert and laying in bed, calm and comfortable. Denies any fevers, headache, chest pain, SOB, cough, abdominal pain, nausea, vomiting or bleeding. Return precautions and treatment recommendations and follow-up discussed with the patient  who is agreeable with the plan.   Past Medical History:  Diagnosis Date   Anxiety    Depression    GERD (gastroesophageal reflux disease)    Hypertension    Migraine     Past Surgical History:  Procedure Laterality Date   COLONOSCOPY WITH PROPOFOL N/A 03/23/2022   Procedure: COLONOSCOPY WITH PROPOFOL;  Surgeon: Lin Landsman, MD;  Location: [facility] ENDOSCOPY;  Service: Gastroenterology;  Laterality: N/A;   ESOPHAGOGASTRODUODENOSCOPY N/A 03/23/2022   Procedure: ESOPHAGOGASTRODUODENOSCOPY (EGD);  Surgeon: Lin Landsman, MD;  Location: [facility] ENDOSCOPY;  Service: Gastroenterology;  Laterality: N/A;   ESOPHAGOGASTRODUODENOSCOPY (EGD) WITH PROPOFOL N/A 04/29/2022   Procedure: ESOPHAGOGASTRODUODENOSCOPY (EGD) WITH PROPOFOL;  Surgeon: Lin Landsman, MD;  Location: [facility] ENDOSCOPY;  Service: Gastroenterology;  Laterality: N/A;   GASTRIC BYPASS  2008    Allergies: Patient has no known allergies.  Medications: Prior to Admission medications   Medication Sig Start Date End Date Taking? Authorizing Provider  escitalopram (LEXAPRO) 20 MG tablet Take 20 mg by mouth daily. 07/30/21   [provider]  fluticasone (FLOVENT HFA) 110 MCG/ACT inhaler Inhale 1 puff into the lungs 2 (two) times daily. 02/20/22   Jearld Fenton, NP  metoCLOPramide (REGLAN) 10 MG tablet Take 1 tablet (10 mg total) by mouth every 8 (eight) hours as needed for nausea. 03/16/22 03/16/23  Vladimir Crofts, MD  Multiple Vitamin (MULTIVITAMIN) tablet Take 1 tablet by mouth daily.    [provider]  omeprazole (PRILOSEC) 40 MG capsule Take 1 capsule (40 mg total) by mouth 2 (two) times daily before a meal. 03/23/22 07/21/22  Vanga, Tally Due, MD  ondansetron (ZOFRAN-ODT) 4 MG disintegrating tablet Take 1 tablet (4 mg total) by mouth every 8 (eight) hours as needed for nausea or vomiting. 03/20/22   Jearld Fenton, NP     Family History  Problem Relation Age of Onset   Healthy Mother    Prostate cancer Father     Social History   Socioeconomic History   Marital status: Single    Spouse name: Not on file   Number of children: Not on file   Years of education: Not on file   Highest education level: Not on file  Occupational History   Not on file  Tobacco Use   Smoking status: Never   Smokeless tobacco: Never  Vaping Use   Vaping Use: Never used  Substance and Sexual Activity   Alcohol use: Yes    Alcohol/week: 6.0 standard drinks of alcohol    Types: 6 Standard drinks or equivalent per week   Drug use: Never   Sexual activity: Not on file  Other Topics Concern   Not on file  Social History Narrative   Not on file   Social Determinants of Health   Financial Resource Strain: Not on file  Food Insecurity: Not on file  Transportation Needs: Not on file  Physical Activity: Not on file  Stress: Not on file  Social Connections: Not on file    Review of Systems: A 12 point ROS discussed and pertinent positives are indicated in the HPI above.  All other systems are negative.  Review of Systems  Constitutional:  Negative for fatigue and fever.  HENT:  Negative for congestion.   Respiratory:  Negative for cough and shortness of breath.   Gastrointestinal:  Negative for abdominal pain, diarrhea, nausea and  vomiting.    Vital Signs: BP (!) 140/91   Pulse (!) 107   Temp 98.3 F (36.8 C) (Oral)   Resp (!) 22   Ht 5' 4"  (1.626 m)   Wt 176 lb (79.8 kg)   SpO2 100%   BMI 30.21 kg/m     Physical Exam Vitals and nursing note reviewed.  Constitutional:      Appearance: She is well-developed.  HENT:     Head: Normocephalic and atraumatic.  Eyes:     Conjunctiva/sclera: Conjunctivae normal.  Cardiovascular:     Rate and Rhythm: Normal rate and regular rhythm.     Heart sounds: Normal heart sounds.  Pulmonary:     Effort: Pulmonary effort is normal.     Breath sounds: Normal breath sounds.  Musculoskeletal:        General: Normal range of motion.     Cervical back: Normal range of motion.  Skin:    General: Skin is warm.  Neurological:     Mental Status: She is alert and oriented to person, place, and time.     Imaging: MM 3D SCREEN BREAST BILATERAL  Result Date: 04/30/2022 CLINICAL DATA:  Screening. EXAM: DIGITAL SCREENING BILATERAL MAMMOGRAM WITH TOMOSYNTHESIS AND CAD TECHNIQUE: Bilateral screening digital craniocaudal and mediolateral oblique mammograms were obtained. Bilateral screening digital breast tomosynthesis was performed. The images were evaluated with computer-aided detection. COMPARISON:  None available. ACR Breast Density Category b: There are scattered areas of fibroglandular density. FINDINGS: There are no findings suspicious for malignancy. IMPRESSION: No mammographic evidence of malignancy. A result letter of this screening mammogram will be mailed directly to the patient. RECOMMENDATION: Screening mammogram in one year. (Code:SM-B-01Y) BI-RADS CATEGORY  1: Negative. Electronically Signed   By: Claudie Revering M.D.   On: 04/30/2022 16:28   Labs:  CBC: Recent Labs    12/23/21 1332 03/16/22 0656 05/22/22 0759  WBC 5.6 4.2 4.6  HGB 14.7 14.5 13.3  HCT 42.1 44.5 39.8  PLT 204 151 160    COAGS: No results for input(s): "INR", "APTT" in the last 8760  hours.  BMP: Recent Labs    12/23/21 1332 03/16/22 0656  NA 135 139  K 5.6* 3.3*  CL 101 106  CO2 22 20*  GLUCOSE 86 128*  BUN 5* <5*  CALCIUM 9.3 9.4  CREATININE 0.76 0.74  GFRNONAA  --  >60    LIVER FUNCTION TESTS: Recent Labs    12/23/21 1332 03/16/22 0656 04/21/22 1634 04/23/22 1154  BILITOT 1.4* 2.7* 1.7* 1.5*  AST 139* 231* 223* 218*  ALT 110* 130* 135* 128*  ALKPHOS  --  134* 163* 174*  PROT 6.3 6.4* 6.3 6.7  ALBUMIN  --  3.1* 3.4* 3.7*     Assessment and Plan:  51 y.o female outpatient. History of HTN, GERD, anxiety. Found to have elevated LFT's. Team is requesting liver biopsy for further evaluation.   [place] abd limited RUQ from 2.20.23 reads Coarse echogenic hepatic parenchyma, a nonspecific finding often associated with fatty  infiltration.  All labs and medications are within acceptable parameters. NKDA. Patient has been NPO since midnight.   Risks and benefits of liver biopsy was discussed with the patient and/or patient's family including, but not limited to bleeding, infection, damage to adjacent structures or low yield requiring additional tests.  All of the questions were answered and there is agreement to proceed.  Consent signed and in chart.    Thank you for this interesting consult.  I greatly enjoyed meeting Monica Leon and look forward to participating in their care.  A copy of this report was sent to the requesting provider on this date.  Electronically Signed: Jacqualine Mau, NP 05/22/2022, 8:28 AM   I spent a total of  30 Minutes   in face to face in clinical consultation, greater than 50% of which was counseling/coordinating care for liver biopsy

## 2022-05-21 NOTE — Telephone Encounter (Signed)
PA sent through Cover My Meds.    Thanks,   -Mickel Baas

## 2022-05-21 NOTE — Telephone Encounter (Signed)
Copied from [place] [id number]. Topic: General - Other >> May 20, 2022 10:29 AM Cyndi Bender wrote: Reason for CRM: Pt stated her insurance will not cover the Rx for fluticasone (FLOVENT HFA) 110 MCG/ACT inhaler and it will cost her $200 to fill it. Pt stated she was told they will cover Pulmicort Flex Haler but she is not sure if that medication is the same. Pt requests call back to advise. Cb# [phone]

## 2022-05-22 ENCOUNTER — Ambulatory Visit
Admission: RE | Admit: 2022-05-22 | Discharge: 2022-05-22 | Disposition: A | Discharge status: Home / Self Care | Payer: BC Managed Care – PPO | Source: Ambulatory Visit | Attending: Gastroenterology | Admitting: Gastroenterology

## 2022-05-22 ENCOUNTER — Other Ambulatory Visit: Payer: Self-pay

## 2022-05-22 DIAGNOSIS — I10 Essential (primary) hypertension: Secondary | ICD-10-CM | POA: Diagnosis not present

## 2022-05-22 DIAGNOSIS — R7401 Elevation of levels of liver transaminase levels: Secondary | ICD-10-CM | POA: Diagnosis not present

## 2022-05-22 DIAGNOSIS — R7989 Other specified abnormal findings of blood chemistry: Secondary | ICD-10-CM | POA: Diagnosis not present

## 2022-05-22 DIAGNOSIS — K7581 Nonalcoholic steatohepatitis (NASH): Secondary | ICD-10-CM | POA: Diagnosis not present

## 2022-05-22 DIAGNOSIS — K219 Gastro-esophageal reflux disease without esophagitis: Secondary | ICD-10-CM | POA: Insufficient documentation

## 2022-05-22 DIAGNOSIS — F419 Anxiety disorder, unspecified: Secondary | ICD-10-CM | POA: Insufficient documentation

## 2022-05-22 LAB — CBC
HCT: 39.8 % (ref 36.0–46.0)
Hemoglobin: 13.3 g/dL (ref 12.0–15.0)
MCH: 34.3 pg — ABNORMAL HIGH (ref 26.0–34.0)
MCHC: 33.4 g/dL (ref 30.0–36.0)
MCV: 102.6 fL — ABNORMAL HIGH (ref 80.0–100.0)
Platelets: 160 10*3/uL (ref 150–400)
RBC: 3.88 MIL/uL (ref 3.87–5.11)
RDW: 12.9 % (ref 11.5–15.5)
WBC: 4.6 10*3/uL (ref 4.0–10.5)
nRBC: 0 % (ref 0.0–0.2)

## 2022-05-22 LAB — PROTIME-INR
INR: 0.9 (ref 0.8–1.2)
Prothrombin Time: 12.2 seconds (ref 11.4–15.2)

## 2022-05-22 MED ORDER — MIDAZOLAM HCL 2 MG/2ML IJ SOLN
INTRAMUSCULAR | Status: AC
  Filled 2022-05-22: qty 2

## 2022-05-22 MED ORDER — SODIUM CHLORIDE 0.9 % IV SOLN: INTRAVENOUS | Status: DC

## 2022-05-22 MED ORDER — FENTANYL CITRATE (PF) 100 MCG/2ML IJ SOLN
INTRAMUSCULAR | Status: AC
  Filled 2022-05-22: qty 2

## 2022-05-22 MED ORDER — FENTANYL CITRATE (PF) 100 MCG/2ML IJ SOLN
INTRAMUSCULAR | Status: AC | PRN
  Administered 2022-05-22 (×2): 50 ug via INTRAVENOUS

## 2022-05-22 MED ORDER — MIDAZOLAM HCL 5 MG/5ML IJ SOLN
INTRAMUSCULAR | Status: AC | PRN
  Administered 2022-05-22 (×2): 1 mg via INTRAVENOUS

## 2022-05-22 NOTE — Progress Notes (Signed)
Patient clinically stable post Liver biopsy per Dr Serafina Royals, tolerated well. Vitals stable pre and post procedure. Received Versed 2 mg along with Fentanyl 100 mcg IV for procedure. Report given to Gari Crown RN post procedure/specials.

## 2022-05-22 NOTE — Procedures (Signed)
Interventional Radiology Procedure Note  Procedure: Ultrasound guided non-focal liver biopsy  Findings: Please refer to procedural dictation for full description. 67 ga core x2 from anterior right lobe.  Gelfoam slurry needle track embolization.  Complications: None immediate  Estimated Blood Loss: < 5 mL  Recommendations: 3 hours strict bedrest. Follow up Pathology results.   Ruthann Cancer, MD

## 2022-05-26 ENCOUNTER — Telehealth: Payer: Self-pay

## 2022-05-26 DIAGNOSIS — R7989 Other specified abnormal findings of blood chemistry: Secondary | ICD-10-CM

## 2022-05-26 LAB — SURGICAL PATHOLOGY

## 2022-05-26 MED ORDER — METFORMIN HCL 500 MG PO TABS: 500.0000 mg | ORAL_TABLET | Freq: Two times a day (BID) | ORAL | 1 refills | Status: DC

## 2022-05-26 NOTE — Telephone Encounter (Signed)
-----   Message from Lin Landsman, MD sent at 05/26/2022  3:44 PM EDT ----- Please inform patient that the liver biopsy results confirm moderate fatty liver disease.  I recommend to start metformin 500 mg daily and strict low-fat, low-carb diet.  Recommend complete abstinence from alcohol use.  Please arrange a virtual visit next week when I am on consults to discuss about the dietary management of fatty liver.  Recheck LFTs in 1 month  RV

## 2022-05-26 NOTE — Telephone Encounter (Signed)
Patient verablized understanding of results. Scheduled mychart visit sent metformin to the pharmacy order lft

## 2022-06-03 ENCOUNTER — Encounter: Payer: Self-pay | Admitting: Gastroenterology

## 2022-06-03 ENCOUNTER — Telehealth (INDEPENDENT_AMBULATORY_CARE_PROVIDER_SITE_OTHER): Payer: BC Managed Care – PPO | Admitting: Gastroenterology

## 2022-06-03 DIAGNOSIS — K222 Esophageal obstruction: Secondary | ICD-10-CM

## 2022-06-03 DIAGNOSIS — K7581 Nonalcoholic steatohepatitis (NASH): Secondary | ICD-10-CM | POA: Diagnosis not present

## 2022-06-03 NOTE — Progress Notes (Signed)
Sherri Sear, MD [address]  [place]  [place], [place] [address]  Main: [phone]  Fax: [phone]    Gastroenterology Consultation Video Visit  Referring Provider:     Jearld Fenton, NP Primary Care Physician:  Jearld Fenton, NP Primary Gastroenterologist:  Dr. Cephas Darby Reason for Consultation: Fatty liver, discussed liver biopsy results        HPI:   Monica Leon is a 51 y.o. female referred by Dr. Garnette Gunner, Coralie Keens, NP  for consultation & management of elevated LFTs  Virtual Visit Video Note  I connected with Gretchen Short on 06/03/22 at  3:30 PM EDT by video and verified that I am speaking with the correct person using two identifiers.   I discussed the limitations, risks, security and privacy concerns of performing an evaluation and management service by video and the availability of in person appointments. I also discussed with the patient that there may be a patient responsible charge related to this service. The patient expressed understanding and agreed to proceed.  Location of the Patient: Home  Location of the provider: Office  Persons participating in the visit: Patient and provider only   History of Present Illness: Monica Leon Reather Converse is a 51 year old female with history of morbid obesity s/p Roux-en-Y gastric bypass, hypertension, fatty liver is seen on a video visit today to discuss about most recent liver biopsy results for elevated LFTs.  Patient underwent secondary liver disease work-up which revealed elevated serum ferritin levels as well as percent iron saturation.  Hemochromatosis panel was negative for increased risk for hereditary hemochromatosis.  Subsequently, patient underwent liver biopsy which confirmed moderate steatosis with features of steatohepatitis.  Patient also has history of benign esophageal stricture, underwent dilation.  Patient reports that, overall her symptoms have significantly improved and she feels really  well.  She is trying to follow low-fat diet, adapting to [place].  She denies taking any plant-based products, over-the-counter supplements or any other medications.  I started her on metformin 500 mg twice daily given history of [place].  Patient has not required Reglan much last week and this week.  NSAIDs: None  Antiplts/Anticoagulants/Anti thrombotics: None  GI Procedures: Upper endoscopy 04/29/2022 - Roux-en-Y gastrojejunostomy with gastrojejunal anastomosis characterized by healthy appearing mucosa. - Benign-appearing esophageal stenosis. Dilated to 15 mm. - No specimens collected.  EGD and colonoscopy 03/23/2022 - Benign-appearing esophageal stenosis. Dilated to 11 mm. Biopsied. - Roux-en-Y gastrojejunostomy with gastrojejunal anastomosis characterized by healthy appearing mucosa. Biopsied. - Normal mucosa was found in the entire stomach. Biopsied.   - The examined portion of the ileum was normal. - One 5 mm polyp in the transverse colon, removed with a cold snare. Resected and retrieved. - Diverticulosis in the sigmoid colon. - Non-bleeding external hemorrhoids. - Normal mucosa in the entire examined colon.   A. SMALL BOWEL; COLD BIOPSY:  - ENTERIC MUCOSA WITH PRESERVED VILLOUS ARCHITECTURE AND NO SIGNIFICANT  HISTOPATHOLOGIC CHANGE.  - NEGATIVE FOR FEATURES OF CELIAC, DYSPLASIA, AND MALIGNANCY.   B.  GASTRIC POUCH; COLD BIOPSY:  - GASTRIC ANTRAL AND OXYNTIC MUCOSA WITH NO SIGNIFICANT HISTOPATHOLOGIC  CHANGE.  - NEGATIVE FOR H. PYLORI, DYSPLASIA, AND MALIGNANCY.   C. ESOPHAGUS, RANDOM; COLD BIOPSY:  - BENIGN SQUAMOUS MUCOSA WITH NO SIGNIFICANT HISTOPATHOLOGIC CHANGE.  - NO INCREASE IN INTRAEPITHELIAL EOSINOPHILS (LESS THAN 2 PER HPF).  - NEGATIVE FOR DYSPLASIA AND   D. COLON POLYP, TRANSVERSE; COLD SNARE:  - TUBULAR ADENOMA.  - NEGATIVE  FOR HIGH-GRADE DYSPLASIA AND MALIGNANCY.   Past Medical History:  Diagnosis Date   Anxiety    Depression    GERD  (gastroesophageal reflux disease)    Hypertension    Migraine     Past Surgical History:  Procedure Laterality Date   COLONOSCOPY WITH PROPOFOL N/A 03/23/2022   Procedure: COLONOSCOPY WITH PROPOFOL;  Surgeon: Lin Landsman, MD;  [facility] ENDOSCOPY;  Service: Gastroenterology;  Laterality: N/A;   ESOPHAGOGASTRODUODENOSCOPY N/A 03/23/2022   Procedure: ESOPHAGOGASTRODUODENOSCOPY (EGD);  Surgeon: Lin Landsman, MD;  Location: [facility] ENDOSCOPY;  Service: Gastroenterology;  Laterality: N/A;   ESOPHAGOGASTRODUODENOSCOPY (EGD) WITH PROPOFOL N/A 04/29/2022   Procedure: ESOPHAGOGASTRODUODENOSCOPY (EGD) WITH PROPOFOL;  Surgeon: Lin Landsman, MD;  Location: [facility] ENDOSCOPY;  Service: Gastroenterology;  Laterality: N/A;   GASTRIC BYPASS  2008     Current Outpatient Medications:    escitalopram (LEXAPRO) 20 MG tablet, Take 20 mg by mouth daily., Disp: , Rfl:    fluticasone (FLOVENT HFA) 110 MCG/ACT inhaler, Inhale 1 puff into the lungs 2 (two) times daily. (Patient not taking: Reported on 05/22/2022), Disp: 1 each, Rfl: 2   metFORMIN (GLUCOPHAGE) 500 MG tablet, Take 1 tablet (500 mg total) by mouth 2 (two) times daily with a meal., Disp: 30 tablet, Rfl: 1   metoCLOPramide (REGLAN) 10 MG tablet, Take 1 tablet (10 mg total) by mouth every 8 (eight) hours as needed for nausea., Disp: 30 tablet, Rfl: 0   Multiple Vitamin (MULTIVITAMIN) tablet, Take 1 tablet by mouth daily., Disp: , Rfl:    omeprazole (PRILOSEC) 40 MG capsule, Take 1 capsule (40 mg total) by mouth 2 (two) times daily before a meal., Disp: 60 capsule, Rfl: 3   ondansetron (ZOFRAN-ODT) 4 MG disintegrating tablet, Take 1 tablet (4 mg total) by mouth every 8 (eight) hours as needed for nausea or vomiting., Disp: 30 tablet, Rfl: 0   Family History  Problem Relation Age of Onset   Healthy Mother    Prostate cancer Father      Social History   Tobacco Use   Smoking status: Never   Smokeless tobacco: Never  Vaping Use    Vaping Use: Never used  Substance Use Topics   Alcohol use: Yes    Alcohol/week: 6.0 standard drinks of alcohol    Types: 6 Standard drinks or equivalent per week   Drug use: Never    Allergies as of 06/03/2022   (No Known Allergies)    Imaging Studies: Reviewed  Assessment and Plan:   Artavia Jeanlouis is a 51 y.o. female with pleasant Caucasian female with history of morbid obesity s/p Roux-en-Y gastric bypass, hypertension, fatty liver is seen in consultation to discuss about liver biopsy results.  Patient's diarrhea has resolved, stool studies confirmed E. coli infection.   Elevated LFTs: Patient has chronically elevated LFTs, initially noted to have mildly elevated transaminases in 2022. Most recently detected in 01/2022 and repeat LFTs have been persistently elevated.  Right upper quadrant ultrasound revealed coarse echogenic hepatic parenchyma.  Patient underwent secondary liver disease work-up which revealed elevated serum ferritin levels as well as percent iron saturation.  Hereditary hemochromatosis panel was negative.  Subsequently, patient underwent liver biopsy which confirmed moderate degree of steatosis with steatohepatitis.  Patient has stopped drinking alcohol, and following Mediterranean diet  Recheck LFTs in next 1 to 2 weeks and in 3 months Advised patient to cut back on iron rich foods including red meat Maintain complete abstinence from alcohol use Recommend Twinrix vaccine Continue  metformin 500 mg p.o. twice daily  Benign esophageal stricture: S/p dilation to 15 mm, currently asymptomatic Continue omeprazole 40 mg p.o. twice daily before meals for 3 months, then once a day before breakfast Recommend repeat EGD in September 2023  Follow Up Instructions:   I discussed the assessment and treatment plan with the patient. The patient was provided an opportunity to ask questions and all were answered. The patient agreed with the plan and demonstrated an  understanding of the instructions.   The patient was advised to call back or seek an in-person evaluation if the symptoms worsen or if the condition fails to improve as anticipated.  I provided 20 minutes of face-to-face time during this encounter.   Follow up in 3 months   Cephas Darby, MD

## 2022-06-08 NOTE — Telephone Encounter (Signed)
Called the pharmacy and they are filling the twice daily medication for her

## 2022-06-22 ENCOUNTER — Other Ambulatory Visit: Payer: Self-pay | Admitting: Gastroenterology

## 2022-06-26 DIAGNOSIS — R7989 Other specified abnormal findings of blood chemistry: Secondary | ICD-10-CM | POA: Diagnosis not present

## 2022-06-27 LAB — HEPATIC FUNCTION PANEL
ALT: 47 IU/L — ABNORMAL HIGH (ref 0–32)
AST: 115 IU/L — ABNORMAL HIGH (ref 0–40)
Albumin: 3.9 g/dL (ref 3.8–4.9)
Alkaline Phosphatase: 181 IU/L — ABNORMAL HIGH (ref 44–121)
Bilirubin Total: 1.1 mg/dL (ref 0.0–1.2)
Bilirubin, Direct: 0.5 mg/dL — ABNORMAL HIGH (ref 0.00–0.40)
Total Protein: 6.8 g/dL (ref 6.0–8.5)

## 2022-07-15 ENCOUNTER — Telehealth: Payer: Self-pay

## 2022-07-15 ENCOUNTER — Other Ambulatory Visit: Payer: Self-pay

## 2022-07-15 DIAGNOSIS — K222 Esophageal obstruction: Secondary | ICD-10-CM

## 2022-07-15 NOTE — Telephone Encounter (Signed)
Patient called to schedule a EGD in September. Informed patient that we have a cancellation for Friday. She states she will do it then. Went over instructions and sent to [employer].

## 2022-07-20 ENCOUNTER — Telehealth: Payer: Self-pay | Admitting: Gastroenterology

## 2022-07-20 NOTE — Telephone Encounter (Signed)
Patient called stating that she is not feeling well and cannot keep any food down and has not appetite. Also states she has been dry heaving.Patient is just concerned and is requesting a call back.

## 2022-07-21 NOTE — Telephone Encounter (Signed)
Recommend liquid diet only for next 24 to 48 hours.  Please check with her if she has any Zofran or Reglan,  if yes, she can try either one of these medications for nausea and vomiting She should continue taking Prilosec 40 mg twice daily  RV

## 2022-07-21 NOTE — Telephone Encounter (Signed)
Called and left a message for call back  

## 2022-07-22 MED ORDER — METOCLOPRAMIDE HCL 10 MG PO TABS: 10.0000 mg | ORAL_TABLET | Freq: Three times a day (TID) | ORAL | 0 refills | Status: DC | PRN

## 2022-07-22 MED ORDER — ONDANSETRON 4 MG PO TBDP: 4.0000 mg | ORAL_TABLET | Freq: Three times a day (TID) | ORAL | 0 refills | Status: DC | PRN

## 2022-07-22 NOTE — Telephone Encounter (Signed)
Patient states she keeps losing weight and can not stop. She states she has been doing a liquid diet but will keep doing it. She states she has 1 pill of each medication left. Called in refills for both medication. Told her to let me know Thursday afternoon if she is not feeling better

## 2022-07-22 NOTE — Addendum Note (Signed)
Addended by: Ulyess Blossom L on: 07/22/2022 08:03 AM   Modules accepted: Orders

## 2022-07-26 ENCOUNTER — Emergency Department: Payer: BC Managed Care – PPO

## 2022-07-26 ENCOUNTER — Observation Stay
Admission: EM | Admit: 2022-07-26 | Discharge: 2022-07-28 | Disposition: A | Discharge status: Home / Self Care | Payer: BC Managed Care – PPO | Attending: Emergency Medicine | Admitting: Emergency Medicine

## 2022-07-26 ENCOUNTER — Other Ambulatory Visit: Payer: Self-pay

## 2022-07-26 DIAGNOSIS — K2210 Ulcer of esophagus without bleeding: Principal | ICD-10-CM

## 2022-07-26 DIAGNOSIS — R0789 Other chest pain: Secondary | ICD-10-CM | POA: Diagnosis not present

## 2022-07-26 DIAGNOSIS — R634 Abnormal weight loss: Secondary | ICD-10-CM | POA: Diagnosis not present

## 2022-07-26 DIAGNOSIS — Z7984 Long term (current) use of oral hypoglycemic drugs: Secondary | ICD-10-CM | POA: Insufficient documentation

## 2022-07-26 DIAGNOSIS — R079 Chest pain, unspecified: Secondary | ICD-10-CM | POA: Insufficient documentation

## 2022-07-26 DIAGNOSIS — R1010 Upper abdominal pain, unspecified: Secondary | ICD-10-CM | POA: Diagnosis not present

## 2022-07-26 DIAGNOSIS — F32A Depression, unspecified: Secondary | ICD-10-CM | POA: Diagnosis present

## 2022-07-26 DIAGNOSIS — Z1152 Encounter for screening for COVID-19: Secondary | ICD-10-CM | POA: Diagnosis not present

## 2022-07-26 DIAGNOSIS — I10 Essential (primary) hypertension: Secondary | ICD-10-CM | POA: Diagnosis present

## 2022-07-26 DIAGNOSIS — K760 Fatty (change of) liver, not elsewhere classified: Secondary | ICD-10-CM

## 2022-07-26 DIAGNOSIS — Z79890 Hormone replacement therapy (postmenopausal): Secondary | ICD-10-CM

## 2022-07-26 DIAGNOSIS — R1013 Epigastric pain: Secondary | ICD-10-CM | POA: Diagnosis not present

## 2022-07-26 DIAGNOSIS — Z79899 Other long term (current) drug therapy: Secondary | ICD-10-CM | POA: Diagnosis not present

## 2022-07-26 DIAGNOSIS — Z20822 Contact with and (suspected) exposure to covid-19: Secondary | ICD-10-CM | POA: Diagnosis present

## 2022-07-26 DIAGNOSIS — R109 Unspecified abdominal pain: Secondary | ICD-10-CM | POA: Diagnosis not present

## 2022-07-26 DIAGNOSIS — K219 Gastro-esophageal reflux disease without esophagitis: Secondary | ICD-10-CM | POA: Diagnosis present

## 2022-07-26 DIAGNOSIS — R7989 Other specified abnormal findings of blood chemistry: Secondary | ICD-10-CM

## 2022-07-26 DIAGNOSIS — Z980 Intestinal bypass and anastomosis status: Secondary | ICD-10-CM | POA: Insufficient documentation

## 2022-07-26 DIAGNOSIS — R1110 Vomiting, unspecified: Secondary | ICD-10-CM | POA: Diagnosis present

## 2022-07-26 DIAGNOSIS — R9431 Abnormal electrocardiogram [ECG] [EKG]: Secondary | ICD-10-CM | POA: Diagnosis not present

## 2022-07-26 DIAGNOSIS — Z7951 Long term (current) use of inhaled steroids: Secondary | ICD-10-CM

## 2022-07-26 DIAGNOSIS — F419 Anxiety disorder, unspecified: Secondary | ICD-10-CM | POA: Diagnosis present

## 2022-07-26 DIAGNOSIS — Z8719 Personal history of other diseases of the digestive system: Secondary | ICD-10-CM

## 2022-07-26 DIAGNOSIS — R1011 Right upper quadrant pain: Secondary | ICD-10-CM | POA: Diagnosis not present

## 2022-07-26 DIAGNOSIS — Z9884 Bariatric surgery status: Secondary | ICD-10-CM

## 2022-07-26 DIAGNOSIS — R0602 Shortness of breath: Secondary | ICD-10-CM | POA: Diagnosis not present

## 2022-07-26 LAB — LIPASE, BLOOD: Lipase: 27 U/L (ref 11–51)

## 2022-07-26 LAB — CBC
HCT: 39.3 % (ref 36.0–46.0)
Hemoglobin: 13 g/dL (ref 12.0–15.0)
MCH: 32.9 pg (ref 26.0–34.0)
MCHC: 33.1 g/dL (ref 30.0–36.0)
MCV: 99.5 fL (ref 80.0–100.0)
Platelets: 251 10*3/uL (ref 150–400)
RBC: 3.95 MIL/uL (ref 3.87–5.11)
RDW: 13.3 % (ref 11.5–15.5)
WBC: 7.5 10*3/uL (ref 4.0–10.5)
nRBC: 0 % (ref 0.0–0.2)

## 2022-07-26 LAB — COMPREHENSIVE METABOLIC PANEL
ALT: 62 U/L — ABNORMAL HIGH (ref 0–44)
AST: 131 U/L — ABNORMAL HIGH (ref 15–41)
Albumin: 3 g/dL — ABNORMAL LOW (ref 3.5–5.0)
Alkaline Phosphatase: 168 U/L — ABNORMAL HIGH (ref 38–126)
Anion gap: 14 (ref 5–15)
BUN: 9 mg/dL (ref 6–20)
CO2: 23 mmol/L (ref 22–32)
Calcium: 8.8 mg/dL — ABNORMAL LOW (ref 8.9–10.3)
Chloride: 98 mmol/L (ref 98–111)
Creatinine, Ser: 0.69 mg/dL (ref 0.44–1.00)
GFR, Estimated: >60 mL/min (ref >60)
Glucose, Bld: 97 mg/dL (ref 70–99)
Potassium: 3.5 mmol/L (ref 3.5–5.1)
Sodium: 135 mmol/L (ref 135–145)
Total Bilirubin: 1.3 mg/dL — ABNORMAL HIGH (ref 0.3–1.2)
Total Protein: 6.7 g/dL (ref 6.5–8.1)

## 2022-07-26 LAB — TROPONIN I (HIGH SENSITIVITY)
Troponin I (High Sensitivity): 8 ng/L (ref <18)
Troponin I (High Sensitivity): 8 ng/L (ref <18)

## 2022-07-26 LAB — D-DIMER, QUANTITATIVE: D-Dimer, Quant: 0.65 ug/mL-FEU — ABNORMAL HIGH (ref 0.00–0.50)

## 2022-07-26 LAB — SARS CORONAVIRUS 2 BY RT PCR: SARS Coronavirus 2 by RT PCR: NEGATIVE

## 2022-07-26 LAB — PROTIME-INR
INR: 0.9 (ref 0.8–1.2)
Prothrombin Time: 12.3 seconds (ref 11.4–15.2)

## 2022-07-26 LAB — MAGNESIUM: Magnesium: 1.5 mg/dL — ABNORMAL LOW (ref 1.7–2.4)

## 2022-07-26 MED ORDER — SODIUM CHLORIDE 0.9 % IV SOLN: INTRAVENOUS | Status: DC

## 2022-07-26 MED ORDER — PANTOPRAZOLE SODIUM 40 MG IV SOLR
40.0000 mg | Freq: Once | INTRAVENOUS | Status: AC
  Administered 2022-07-26: 40 mg via INTRAVENOUS
  Filled 2022-07-26: qty 10

## 2022-07-26 MED ORDER — ALUM & MAG HYDROXIDE-SIMETH 200-200-20 MG/5ML PO SUSP
30.0000 mL | Freq: Once | ORAL | Status: AC
  Administered 2022-07-26: 30 mL via ORAL
  Filled 2022-07-26: qty 30

## 2022-07-26 MED ORDER — MORPHINE SULFATE (PF) 2 MG/ML IV SOLN
2.0000 mg | INTRAVENOUS | Status: DC | PRN
  Administered 2022-07-27 – 2022-07-28 (×4): 2 mg via INTRAVENOUS
  Filled 2022-07-26 (×5): qty 1

## 2022-07-26 MED ORDER — HYDROMORPHONE HCL 1 MG/ML IJ SOLN
0.5000 mg | Freq: Once | INTRAMUSCULAR | Status: AC
  Administered 2022-07-26: 0.5 mg via INTRAVENOUS
  Filled 2022-07-26: qty 0.5

## 2022-07-26 MED ORDER — ENOXAPARIN SODIUM 40 MG/0.4ML IJ SOSY
40.0000 mg | PREFILLED_SYRINGE | INTRAMUSCULAR | Status: DC
  Administered 2022-07-26 – 2022-07-27 (×2): 40 mg via SUBCUTANEOUS
  Filled 2022-07-26 (×2): qty 0.4

## 2022-07-26 MED ORDER — PANTOPRAZOLE SODIUM 40 MG IV SOLR
40.0000 mg | INTRAVENOUS | Status: DC
  Filled 2022-07-26: qty 10

## 2022-07-26 MED ORDER — PROCHLORPERAZINE EDISYLATE 10 MG/2ML IJ SOLN
10.0000 mg | INTRAMUSCULAR | Status: DC | PRN
  Administered 2022-07-28: 10 mg via INTRAVENOUS
  Filled 2022-07-26 (×2): qty 2

## 2022-07-26 MED ORDER — ACETAMINOPHEN 650 MG RE SUPP: 650.0000 mg | Freq: Four times a day (QID) | RECTAL | Status: DC | PRN

## 2022-07-26 MED ORDER — ACETAMINOPHEN 325 MG PO TABS
650.0000 mg | ORAL_TABLET | Freq: Four times a day (QID) | ORAL | Status: DC | PRN
  Administered 2022-07-28: 650 mg via ORAL
  Filled 2022-07-26 (×2): qty 2

## 2022-07-26 MED ORDER — SODIUM CHLORIDE 0.9 % IV BOLUS
1000.0000 mL | Freq: Once | INTRAVENOUS | Status: AC
  Administered 2022-07-26: 1000 mL via INTRAVENOUS

## 2022-07-26 MED ORDER — MAGNESIUM SULFATE 2 GM/50ML IV SOLN
2.0000 g | Freq: Once | INTRAVENOUS | Status: AC
  Administered 2022-07-26: 2 g via INTRAVENOUS
  Filled 2022-07-26: qty 50

## 2022-07-26 MED ORDER — POTASSIUM CHLORIDE 10 MEQ/100ML IV SOLN
10.0000 meq | INTRAVENOUS | Status: AC
  Administered 2022-07-26 (×2): 10 meq via INTRAVENOUS
  Filled 2022-07-26 (×2): qty 100

## 2022-07-26 MED ORDER — IOHEXOL 350 MG/ML SOLN
100.0000 mL | Freq: Once | INTRAVENOUS | Status: AC | PRN
  Administered 2022-07-26: 100 mL via INTRAVENOUS

## 2022-07-26 MED ORDER — ONDANSETRON HCL 4 MG/2ML IJ SOLN
4.0000 mg | Freq: Once | INTRAMUSCULAR | Status: AC
  Administered 2022-07-26: 4 mg via INTRAVENOUS
  Filled 2022-07-26: qty 2

## 2022-07-26 NOTE — Assessment & Plan Note (Signed)
Avoid QT prolonging drugs

## 2022-07-26 NOTE — Assessment & Plan Note (Signed)
Repleted in the ED. Continue to monitor and replete as necessary

## 2022-07-26 NOTE — ED Provider Triage Note (Signed)
Emergency Medicine Provider Triage Evaluation Note  [facility] Monica Leon , a 51 y.o. female  was evaluated in triage.  Pt complains of abdominal pain/epigastric pain that started yesterday. Worse today. No fever. History of gastric bypass, but no other abdominal surgeries.  Physical Exam  Ht 5' 4"  (1.626 m)   Wt 70.3 kg   BMI 26.61 kg/m  Gen:   Awake, no distress   Resp:  Normal effort  MSK:   Moves extremities without difficulty  Other:    Medical Decision Making  Medically screening exam initiated at 3:39 PM.  Appropriate orders placed.  Monica Leon was informed that the remainder of the evaluation will be completed by another provider, this initial triage assessment does not replace that evaluation, and the importance of remaining in the ED until their evaluation is complete.    Victorino Dike, FNP 07/26/22 1541

## 2022-07-26 NOTE — H&P (Signed)
History and Physical    Patient: Monica Leon DJS:[id number] DOB: August 21, 1971 DOA: 07/26/2022 DOS: the patient was seen and examined on 07/26/2022 PCP: Jearld Fenton, NP  Patient coming from: Home  Chief Complaint:  Chief Complaint  Patient presents with   Abdominal Pain   Chest Pain    HPI: Monica Leon is a 51 y.o. female with medical history significant for Gastric bypass, esophageal stricture and hypertension who presents to the ED with a 2-day complaint of upper abdominal pain radiating up her chest associated with shortness of breath.  She denied fever or chills, vomiting or diarrhea and denies dysuria. ED course and data review: BP 116/98 pulse 109 with otherwise normal vitals.  Labs significant for elevated LFTs with AST 131, ALT 62 and alk phos 168 total bili 1.3.  Lipase normal, CBC WNL, magnesium slightly depressed at 1.5, INR normal.  COVID-negative.  EKG, personally viewed and interpreted showing sinus tachycardia at 108 with prolonged QTc of 661. Imaging: Patient had extensive imaging including chest x-ray, CTA PE protocol, CT abdomen and pelvis with contrast and right upper quadrant ultrasound that was significant mostly for hepatic steatosis.  HIDA scan ordered from the ED. Patient was treated with Zofran, IV fluids, hydrocodone for pain, given IV magnesium.  Due to insufficient pain relief and need for further work-up of abnormal liver enzymes, hospitalist consulted for admission    Review of Systems: As mentioned in the history of present illness. All other systems reviewed and are negative.  Past Medical History:  Diagnosis Date   Anxiety    Depression    GERD (gastroesophageal reflux disease)    Hypertension    Migraine    Past Surgical History:  Procedure Laterality Date   COLONOSCOPY WITH PROPOFOL N/A 03/23/2022   Procedure: COLONOSCOPY WITH PROPOFOL;  Surgeon: Lin Landsman, MD;  Location[facility] ENDOSCOPY;  Service: Gastroenterology;  Laterality:  N/A;   ESOPHAGOGASTRODUODENOSCOPY N/A 03/23/2022   Procedure: ESOPHAGOGASTRODUODENOSCOPY (EGD);  Surgeon: Lin Landsman, MD;  Location: [facility] ENDOSCOPY;  Service: Gastroenterology;  Laterality: N/A;   ESOPHAGOGASTRODUODENOSCOPY (EGD) WITH PROPOFOL N/A 04/29/2022   Procedure: ESOPHAGOGASTRODUODENOSCOPY (EGD) WITH PROPOFOL;  Surgeon: Lin Landsman, MD;  Location: [facility] ENDOSCOPY;  Service: Gastroenterology;  Laterality: N/A;   GASTRIC BYPASS  2008   Social History:  reports that she has never smoked. She has never used smokeless tobacco. She reports current alcohol use of about 6.0 standard drinks of alcohol per week. She reports that she does not use drugs.  No Known Allergies  Family History  Problem Relation Age of Onset   Healthy Mother    Prostate cancer Father     Prior to Admission medications   Medication Sig Start Date End Date Taking? Authorizing Provider  escitalopram (LEXAPRO) 20 MG tablet Take 20 mg by mouth daily. 07/30/21   [provider]  fluticasone (FLOVENT HFA) 110 MCG/ACT inhaler Inhale 1 puff into the lungs 2 (two) times daily. Patient not taking: Reported on 05/22/2022 02/20/22   Jearld Fenton, NP  metFORMIN (GLUCOPHAGE) 500 MG tablet TAKE 1 TABLET(500 MG) BY MOUTH TWICE DAILY WITH A MEAL 06/22/22   Vanga, Tally Due, MD  metoCLOPramide (REGLAN) 10 MG tablet Take 1 tablet (10 mg total) by mouth every 8 (eight) hours as needed for nausea. 07/22/22 07/22/23  Lin Landsman, MD  Multiple Vitamin (MULTIVITAMIN) tablet Take 1 tablet by mouth daily.    [provider]  omeprazole (PRILOSEC) 40 MG capsule Take 1 capsule (40  mg total) by mouth 2 (two) times daily before a meal. 03/23/22 07/21/22  Vanga, Tally Due, MD  ondansetron (ZOFRAN-ODT) 4 MG disintegrating tablet Take 1 tablet (4 mg total) by mouth every 8 (eight) hours as needed for nausea or vomiting. 07/22/22   Lin Landsman, MD    Physical Exam: Vitals:   07/26/22 1700 07/26/22  1800 07/26/22 1830 07/26/22 1852  BP: (!) 143/57 129/84 119/77   Pulse: 87 93 91   Resp: 18 15 17    Temp:    98.2 F (36.8 C)  TempSrc:    Oral  SpO2: 100% 93% 94%   Weight:      Height:       Physical Exam Vitals and nursing note reviewed.  Constitutional:      General: She is not in acute distress. HENT:     Head: Normocephalic and atraumatic.  Cardiovascular:     Rate and Rhythm: Normal rate and regular rhythm.     Heart sounds: Normal heart sounds.  Pulmonary:     Effort: Pulmonary effort is normal.     Breath sounds: Normal breath sounds.  Abdominal:     Palpations: Abdomen is soft.     Tenderness: There is abdominal tenderness in the right upper quadrant and epigastric area.  Neurological:     Mental Status: Mental status is at baseline.     Labs on Admission: I have personally reviewed following labs and imaging studies  CBC: Recent Labs  Lab 07/26/22 1545  WBC 7.5  HGB 13.0  HCT 39.3  MCV 99.5  PLT 939   Basic Metabolic Panel: Recent Labs  Lab 07/26/22 1545  NA 135  K 3.5  CL 98  CO2 23  GLUCOSE 97  BUN 9  CREATININE 0.69  CALCIUM 8.8*  MG 1.5*   GFR: Estimated Creatinine Clearance: 80 mL/min (by C-G formula based on SCr of 0.69 mg/dL). Liver Function Tests: Recent Labs  Lab 07/26/22 1545  AST 131*  ALT 62*  ALKPHOS 168*  BILITOT 1.3*  PROT 6.7  ALBUMIN 3.0*   Recent Labs  Lab 07/26/22 1545  LIPASE 27   No results for input(s): "AMMONIA" in the last 168 hours. Coagulation Profile: Recent Labs  Lab 07/26/22 1659  INR 0.9   Cardiac Enzymes: No results for input(s): "CKTOTAL", "CKMB", "CKMBINDEX", "TROPONINI" in the last 168 hours. BNP (last 3 results) No results for input(s): "PROBNP" in the last 8760 hours. HbA1C: No results for input(s): "HGBA1C" in the last 72 hours. CBG: No results for input(s): "GLUCAP" in the last 168 hours. Lipid Profile: No results for input(s): "CHOL", "HDL", "LDLCALC", "TRIG", "CHOLHDL",  "LDLDIRECT" in the last 72 hours. Thyroid Function Tests: No results for input(s): "TSH", "T4TOTAL", "FREET4", "T3FREE", "THYROIDAB" in the last 72 hours. Anemia Panel: No results for input(s): "VITAMINB12", "FOLATE", "FERRITIN", "TIBC", "IRON", "RETICCTPCT" in the last 72 hours. Urine analysis: No results found for: "COLORURINE", "APPEARANCEUR", "LABSPEC", "PHURINE", "GLUCOSEU", "HGBUR", "BILIRUBINUR", "KETONESUR", "PROTEINUR", "UROBILINOGEN", "NITRITE", "LEUKOCYTESUR"  Radiological Exams on Admission: CT Angio Chest PE W and/or Wo Contrast  Result Date: 07/26/2022 CLINICAL DATA:  Chest pain and shortness of breath EXAM: CT ANGIOGRAPHY CHEST WITH CONTRAST TECHNIQUE: Multidetector CT imaging of the chest was performed using the standard protocol during bolus administration of intravenous contrast. Multiplanar CT image reconstructions and MIPs were obtained to evaluate the vascular anatomy. RADIATION DOSE REDUCTION: This exam was performed according to the departmental dose-optimization program which includes automated exposure control, adjustment of the mA and/or kV  according to patient size and/or use of iterative reconstruction technique. CONTRAST:  123m OMNIPAQUE IOHEXOL 350 MG/ML SOLN COMPARISON:  None Available. FINDINGS: Cardiovascular: Thoracic aorta and its branches show normal appearance without evidence of aneurysmal dilatation or dissection. No cardiac enlargement is noted. No coronary calcifications are seen. The pulmonary artery shows a normal branching pattern bilaterally. No filling defects are identified to suggest pulmonary embolism. Mediastinum/Nodes: Thoracic inlet is within normal limits. No hilar or mediastinal adenopathy is noted. The esophagus as visualized is within normal limits. Lungs/Pleura: Lungs are well aerated bilaterally. Mild bibasilar atelectatic changes are noted. No focal parenchymal nodule or effusion is seen. Upper Abdomen: Visualized upper abdomen demonstrates  diffuse fatty infiltration of the liver. Changes of prior gastric bypass are seen. Musculoskeletal: No chest wall abnormality. No acute or significant osseous findings. Review of the MIP images confirms the above findings. IMPRESSION: No evidence of pulmonary emboli. Fatty liver. Electronically Signed   By: MInez CatalinaM.D.   On: 07/26/2022 19:18   CT ABDOMEN PELVIS W CONTRAST  Result Date: 07/26/2022 CLINICAL DATA:  Postoperative abdominal pain. History of gastric bypass. EXAM: CT ABDOMEN AND PELVIS WITH CONTRAST TECHNIQUE: Multidetector CT imaging of the abdomen and pelvis was performed using the standard protocol following bolus administration of intravenous contrast. RADIATION DOSE REDUCTION: This exam was performed according to the departmental dose-optimization program which includes automated exposure control, adjustment of the mA and/or kV according to patient size and/or use of iterative reconstruction technique. CONTRAST:  1032mOMNIPAQUE IOHEXOL 350 MG/ML SOLN COMPARISON:  Abdominal ultrasound 07/26/2022 FINDINGS: Lower chest: There is minimal atelectasis in the lung bases. Hepatobiliary: Liver is mildly enlarged. There is diffuse fatty infiltration of the liver. There is mild diffuse gallbladder wall thickening. There is no biliary ductal dilatation. Pancreas: Unremarkable. No pancreatic ductal dilatation or surrounding inflammatory changes. Spleen: Normal in size without focal abnormality. Adrenals/Urinary Tract: Adrenal glands are unremarkable. Kidneys are normal, without renal calculi, focal lesion, or hydronephrosis. Bladder is unremarkable. Stomach/Bowel: Patient is status post gastric bypass surgery. No dilated bowel loops, focal wall thickening or inflammation identified. No evidence for bowel obstruction or free air. The appendix is within normal limits. Vascular/Lymphatic: No significant vascular findings are present. No enlarged abdominal or pelvic lymph nodes. Reproductive: There is an  IUD in the uterus. Adnexa are within normal limits. Other: No abdominal wall hernia or abnormality. No abdominopelvic ascites. Musculoskeletal: No acute or significant osseous findings. IMPRESSION: 1. Mild gallbladder wall thickening, indeterminate. Correlate clinically for cholecystitis. 2. Hepatomegaly and hepatic steatosis. 3. Gastric bypass changes without complication. 4. IUD in the uterus. Electronically Signed   By: AmRonney Asters.D.   On: 07/26/2022 19:18   [place]OMEN LIMITED RUQ (LIVER/GB)  Result Date: 07/26/2022 CLINICAL DATA:  Right upper quadrant pain EXAM: ULTRASOUND ABDOMEN LIMITED RIGHT UPPER QUADRANT COMPARISON:  None Available. FINDINGS: Gallbladder: No gallstones or wall thickening visualized. No sonographic Murphy sign noted by sonographer. Common bile duct: Diameter: 1 mm. Liver: No focal lesion identified. Increased parenchymal echogenicity. Portal vein is patent on color Doppler imaging with normal direction of blood flow towards the liver. Other: None. IMPRESSION: Hepatic steatosis. Please note limited evaluation for focal hepatic masses in a patient with hepatic steatosis due to decreased penetration of the acoustic ultrasound waves. Electronically Signed   By: MoIven Finn.D.   On: 07/26/2022 17:14   DG Chest 2 View  Result Date: 07/26/2022 CLINICAL DATA:  Chest abdominal pain since yesterday EXAM: CHEST - 2 VIEW COMPARISON:  12/23/2021 FINDINGS: Frontal and lateral views of the chest demonstrate an unremarkable cardiac silhouette. No acute airspace disease, effusion, or pneumothorax. No acute bony abnormalities. IMPRESSION: 1. No acute intrathoracic process. Electronically Signed   By: Randa Ngo M.D.   On: 07/26/2022 16:21     Data Reviewed: Relevant notes from primary care and specialist visits, past discharge summaries as available in EHR, including Care Everywhere. Prior diagnostic testing as pertinent to current admission diagnoses Updated medications and  problem lists for reconciliation ED course, including vitals, labs, imaging, treatment and response to treatment Triage notes, nursing and pharmacy notes and ED provider's notes Notable results as noted in HPI   Assessment and Plan: * Abdominal pain Abnormal LFTs Hepatic steatosis History of gastric bypass and esophageal stricture Etiology uncertain.  Patient has history of bypass, esophageal stricture and now has elevated LFTs.  Differential includes gallbladder disease, peptic ulcer disease related to bypass, recurrence of esophageal stricture Follow-up HIDA scan Pain control and empiric IV Protonix We will keep n.p.o. overnight GI consult  Hypomagnesemia Repleted in the ED. Continue to monitor and replete as necessary  Prolonged QT interval Avoid QT prolonging drugs   Anxiety and depression Hold Lexapro while n.p.o.        DVT prophylaxis: Lovenox  Consults: GI  Advance Care Planning: full code  Family Communication: none  Disposition Plan: Back to previous home environment  Severity of Illness: The appropriate patient status for this patient is OBSERVATION. Observation status is judged to be reasonable and necessary in order to provide the required intensity of service to ensure the patient's safety. The patient's presenting symptoms, physical exam findings, and initial radiographic and laboratory data in the context of their medical condition is felt to place them at decreased risk for further clinical deterioration. Furthermore, it is anticipated that the patient will be medically stable for discharge from the hospital within 2 midnights of admission.   Author: Athena Masse, MD 07/26/2022 9:16 PM  For on call review [email or web address].

## 2022-07-26 NOTE — Assessment & Plan Note (Addendum)
Hold Lexapro while n.p.o.

## 2022-07-26 NOTE — Assessment & Plan Note (Signed)
Etiology uncertain.  Patient has history of bypass, esophageal stricture.  --HIDA scan neg today Plan: --plan for EGD tomorrow --IV PPI BID

## 2022-07-26 NOTE — ED Provider Notes (Signed)
[facility] Provider Note    Event Date/Time   First MD Initiated Contact with Patient 07/26/22 1609     (approximate)   History   Abdominal Pain and Chest Pain   HPI  Monica Leon [address] Monica Leon is a 51 y.o. female who comes in with centralized chest pain abdominal pain since last night patient does have a history of gastric bypass, as well as a esophageal stricture, Nash syndrome.  Patient reports pain in her upper abdomen that radiates up into her chest.  She reports the pain in her abdomen is worse than the pain in her chest but she does report some shortness of breath associated with it.  She denies ever having this previously.  She does report a history of esophageal stricture with a dilation coming up soon but states that this does not feel like that.     Physical Exam   Triage Vital Signs: ED Triage Vitals  Enc Vitals Group     BP 07/26/22 1542 (!) 116/98     Pulse Rate 07/26/22 1542 (!) 109     Resp 07/26/22 1542 20     Temp 07/26/22 1542 98.2 F (36.8 C)     Temp Source 07/26/22 1542 Oral     SpO2 07/26/22 1542 99 %     Weight 07/26/22 1539 155 lb (70.3 kg)     Height 07/26/22 1539 5' 4"  (1.626 m)     Head Circumference --      Peak Flow --      Pain Score 07/26/22 1538 7     Pain Loc --      Pain Edu? --      Excl. in [place]? --     Most recent vital signs: Vitals:   07/26/22 1542  BP: (!) 116/98  Pulse: (!) 109  Resp: 20  Temp: 98.2 F (36.8 C)  SpO2: 99%     General: Awake, no distress.  CV:  Good peripheral perfusion.  Resp:  Normal effort.  Abd:  No distention.  Tenderness in the upper abdomen Other:  No swelling of the legs   ED Results / Procedures / Treatments   Labs (all labs ordered are listed, but only abnormal results are displayed) Labs Reviewed  CBC  LIPASE, BLOOD  COMPREHENSIVE METABOLIC PANEL  URINALYSIS, ROUTINE W REFLEX MICROSCOPIC  MAGNESIUM  POC URINE PREG, ED  TROPONIN I (HIGH SENSITIVITY)      EKG  My interpretation of EKG:  Sinus tachycardia rate of 108 without any ST elevation or T wave inversions.  Prolonged QTc at 661, normal intervals  RADIOLOGY I have reviewed the xray personally and interpreted and do not see any evidence of widened mediastinum   PROCEDURES:  Critical Care performed: No  .1-3 Lead EKG Interpretation  Performed by: Vanessa [place], MD Authorized by: Vanessa [place], MD     Interpretation: abnormal     ECG rate:  100   ECG rate assessment: tachycardic     Rhythm: sinus tachycardia     Ectopy: none     Conduction: normal      MEDICATIONS ORDERED IN ED: Medications  alum & mag hydroxide-simeth (MAALOX/MYLANTA) 200-200-20 MG/5ML suspension 30 mL (has no administration in time range)  sodium chloride 0.9 % bolus 1,000 mL (0 mLs Intravenous Stopped 07/26/22 1847)  HYDROmorphone (DILAUDID) injection 0.5 mg (0.5 mg Intravenous Given 07/26/22 1708)  ondansetron (ZOFRAN) injection 4 mg (4 mg Intravenous Given 07/26/22 1707)  magnesium sulfate IVPB  2 g 50 mL (0 g Intravenous Stopped 07/26/22 1847)  iohexol (OMNIPAQUE) 350 MG/ML injection 100 mL (100 mLs Intravenous Contrast Given 07/26/22 1859)     IMPRESSION / MDM / ASSESSMENT AND PLAN / ED COURSE  I reviewed the triage vital signs and the nursing notes.   Patient's presentation is most consistent with acute presentation with potential threat to life or bodily function.   Differential includes dissection, PE, gallstones, SBO, obstruction.  I will start off with ultrasound and D-dimer to first evaluate for any gallstones.  This was negative therefore proceeded with D-dimer which was elevated therefore we will proceed with CT PE given the shortness of breath as well as CT abdomen to further evaluate.  Patient given some Dilaudid to help with pain.  CBC is normal.  INR normal.  COVID test was negative.  Her CMP shows elevated liver test but similar to her prior.  Her mag was low so patient given some  IV magnesium  Her CT abdomen commented on some mild gallbladder wall thickening and to correlate for cholecystitis. Ct abd negative.   On repeat assessment patient reporting severe pain still.  She does not feel comfortable discharge home.  Not sure if this could be related to some esophageal spasming versus ulcer versus gallbladder issue.  Will order HIDA scan given concern for gb wallthicken but [place] was negative so not sure? and discussed the hospital team for admission given her hypomagnesia and prolonged QTc  The patient is on the cardiac monitor to evaluate for evidence of arrhythmia and/or significant heart rate changes.      FINAL CLINICAL IMPRESSION(S) / ED DIAGNOSES   Final diagnoses:  QT prolongation  Hypomagnesemia  Abdominal pain, unspecified abdominal location     Rx / DC Orders   ED Discharge Orders     None        Note:  This document was prepared using Dragon voice recognition software and may include unintentional dictation errors.   Vanessa [place], MD 07/26/22 2147

## 2022-07-26 NOTE — ED Triage Notes (Signed)
Pt reports Central CP and ABD pain since last night. Pt thought that rest would help, but pain has just gotten worse. H/x gastric bypass

## 2022-07-27 ENCOUNTER — Inpatient Hospital Stay: Payer: BC Managed Care – PPO

## 2022-07-27 DIAGNOSIS — R1011 Right upper quadrant pain: Secondary | ICD-10-CM | POA: Diagnosis not present

## 2022-07-27 DIAGNOSIS — R7989 Other specified abnormal findings of blood chemistry: Secondary | ICD-10-CM

## 2022-07-27 DIAGNOSIS — Z20822 Contact with and (suspected) exposure to covid-19: Secondary | ICD-10-CM | POA: Diagnosis present

## 2022-07-27 DIAGNOSIS — K219 Gastro-esophageal reflux disease without esophagitis: Secondary | ICD-10-CM | POA: Diagnosis present

## 2022-07-27 DIAGNOSIS — F32A Depression, unspecified: Secondary | ICD-10-CM | POA: Diagnosis present

## 2022-07-27 DIAGNOSIS — Z7951 Long term (current) use of inhaled steroids: Secondary | ICD-10-CM | POA: Diagnosis not present

## 2022-07-27 DIAGNOSIS — K760 Fatty (change of) liver, not elsewhere classified: Secondary | ICD-10-CM | POA: Diagnosis present

## 2022-07-27 DIAGNOSIS — Z79899 Other long term (current) drug therapy: Secondary | ICD-10-CM | POA: Diagnosis not present

## 2022-07-27 DIAGNOSIS — Z9884 Bariatric surgery status: Secondary | ICD-10-CM | POA: Diagnosis not present

## 2022-07-27 DIAGNOSIS — R1010 Upper abdominal pain, unspecified: Secondary | ICD-10-CM | POA: Diagnosis not present

## 2022-07-27 DIAGNOSIS — K2210 Ulcer of esophagus without bleeding: Secondary | ICD-10-CM | POA: Diagnosis not present

## 2022-07-27 DIAGNOSIS — F419 Anxiety disorder, unspecified: Secondary | ICD-10-CM | POA: Diagnosis present

## 2022-07-27 DIAGNOSIS — I10 Essential (primary) hypertension: Secondary | ICD-10-CM | POA: Diagnosis present

## 2022-07-27 DIAGNOSIS — R1013 Epigastric pain: Secondary | ICD-10-CM | POA: Diagnosis not present

## 2022-07-27 DIAGNOSIS — R1110 Vomiting, unspecified: Secondary | ICD-10-CM | POA: Diagnosis present

## 2022-07-27 DIAGNOSIS — Z79890 Hormone replacement therapy (postmenopausal): Secondary | ICD-10-CM | POA: Diagnosis not present

## 2022-07-27 LAB — URINALYSIS, ROUTINE W REFLEX MICROSCOPIC
Bacteria, UA: NONE SEEN
Bilirubin Urine: NEGATIVE
Glucose, UA: NEGATIVE mg/dL
Hgb urine dipstick: NEGATIVE
Ketones, ur: 20 mg/dL — AB
Nitrite: NEGATIVE
Protein, ur: NEGATIVE mg/dL
Specific Gravity, Urine: 1.031 — ABNORMAL HIGH (ref 1.005–1.030)
pH: 5 (ref 5.0–8.0)

## 2022-07-27 LAB — IRON: Iron: 65 ug/dL (ref 28–170)

## 2022-07-27 LAB — FOLATE: Folate: 5.2 ng/mL — ABNORMAL LOW (ref >5.9)

## 2022-07-27 LAB — VITAMIN B12: Vitamin B-12: 1311 pg/mL — ABNORMAL HIGH (ref 180–914)

## 2022-07-27 LAB — VITAMIN D 25 HYDROXY (VIT D DEFICIENCY, FRACTURES): Vit D, 25-Hydroxy: 51.78 ng/mL (ref 30–100)

## 2022-07-27 MED ORDER — TECHNETIUM TC 99M MEBROFENIN IV KIT
5.0000 | PACK | Freq: Once | INTRAVENOUS | Status: AC | PRN
  Administered 2022-07-27: 4.6 via INTRAVENOUS

## 2022-07-27 MED ORDER — PANTOPRAZOLE SODIUM 40 MG IV SOLR
40.0000 mg | Freq: Two times a day (BID) | INTRAVENOUS | Status: DC
  Administered 2022-07-27: 40 mg via INTRAVENOUS
  Filled 2022-07-27: qty 10

## 2022-07-27 MED ORDER — SODIUM CHLORIDE 0.9 % IV SOLN: INTRAVENOUS | Status: DC

## 2022-07-27 MED ORDER — MELATONIN 5 MG PO TABS
5.0000 mg | ORAL_TABLET | Freq: Every evening | ORAL | Status: DC | PRN
  Administered 2022-07-27: 5 mg via ORAL
  Filled 2022-07-27: qty 1

## 2022-07-27 NOTE — Progress Notes (Signed)
       CROSS COVER NOTE  NAME: Monica Leon MRN: [id number] DOB : Feb 21, 1971    Date of Service   07/27/2022   HPI/Events of Note   Medication request received for sleep aid.  Interventions   Plan: Melatonin X X      This document was prepared using Dragon voice recognition software and may include unintentional dictation errors.  Neomia Glass DNP, MHA, FNP-BC Nurse Practitioner [facility] Pager [phone]

## 2022-07-27 NOTE — Plan of Care (Signed)

## 2022-07-27 NOTE — Consult Note (Signed)
Monica Darby, MD [address]  [place]  [place], [place] [address]  Main: [phone]  Fax: [phone] Pager: [phone]   Consultation  Referring Provider:     No ref. provider found Primary Care Physician:  Jearld Fenton, NP Primary Gastroenterologist:  Dr. Sherri Sear        Reason for Consultation: Epigastric pain  Date of Admission:  07/26/2022 Date of Consultation:  07/27/2022         HPI:   Monica Leon Monica Leon is a 51 y.o. female with history of morbid obesity s/p Roux-en-Y gastric bypass, history of hypertension, fatty liver biopsy-proven is admitted with severe recurrence of epigastric pain.  She came to the ER last night with epigastric pain and also pain in her chest.  She called my office last week with similar pain associated with nausea and vomiting.  I have encouraged her to follow a low residue diet and see if it helps.  She reports that her symptoms resolved but returned last night.  Labs reveal normal serum lipase, elevated LFTs which have been chronically elevated AST 131, ALT 62, alkaline phosphatase 168, T. bili 1.3, normal CBC.  She underwent CT PE protocol to rule out PE because of chest pain, which was negative.  She also underwent CT abdomen and pelvis with contrast which not reveal any acute intra-abdominal pathology.  Right upper quadrant ultrasound revealed gallbladder wall thickening.  Patient is currently undergoing HIDA scan to rule out acute cholecystitis.  I saw the patient when she was undergoing HIDA scan.  Patient has history of esophageal stricture, underwent dilation to 15 mm on 04/29/2022.  She is scheduled to undergo outpatient EGD for stricture dilation on 08/06/2022.  NSAIDs: None  Antiplts/Anticoagulants/Anti thrombotics: None   GI Procedures: Upper endoscopy 04/29/2022 - Roux-en-Y gastrojejunostomy with gastrojejunal anastomosis characterized by healthy appearing mucosa. - Benign-appearing esophageal stenosis. Dilated to 15  mm. - No specimens collected.   EGD and colonoscopy 03/23/2022 - Benign-appearing esophageal stenosis. Dilated to 11 mm. Biopsied. - Roux-en-Y gastrojejunostomy with gastrojejunal anastomosis characterized by healthy appearing mucosa. Biopsied. - Normal mucosa was found in the entire stomach. Biopsied.   - The examined portion of the ileum was normal. - One 5 mm polyp in the transverse colon, removed with a cold snare. Resected and retrieved. - Diverticulosis in the sigmoid colon. - Non-bleeding external hemorrhoids. - Normal mucosa in the entire examined colon.   A. SMALL BOWEL; COLD BIOPSY:  - ENTERIC MUCOSA WITH PRESERVED VILLOUS ARCHITECTURE AND NO SIGNIFICANT  HISTOPATHOLOGIC CHANGE.  - NEGATIVE FOR FEATURES OF CELIAC, DYSPLASIA, AND MALIGNANCY.   B.  GASTRIC POUCH; COLD BIOPSY:  - GASTRIC ANTRAL AND OXYNTIC MUCOSA WITH NO SIGNIFICANT HISTOPATHOLOGIC  CHANGE.  - NEGATIVE FOR H. PYLORI, DYSPLASIA, AND MALIGNANCY.   C. ESOPHAGUS, RANDOM; COLD BIOPSY:  - BENIGN SQUAMOUS MUCOSA WITH NO SIGNIFICANT HISTOPATHOLOGIC CHANGE.  - NO INCREASE IN INTRAEPITHELIAL EOSINOPHILS (LESS THAN 2 PER HPF).  - NEGATIVE FOR DYSPLASIA AND   D. COLON POLYP, TRANSVERSE; COLD SNARE:  - TUBULAR ADENOMA.  - NEGATIVE FOR HIGH-GRADE DYSPLASIA AND MALIGNANCY.   Past Medical History:  Diagnosis Date   Anxiety    Depression    GERD (gastroesophageal reflux disease)    Hypertension    Migraine     Past Surgical History:  Procedure Laterality Date   COLONOSCOPY WITH PROPOFOL N/A 03/23/2022   Procedure: COLONOSCOPY WITH PROPOFOL;  Surgeon: Lin Landsman, MD;  Location: [facility] ENDOSCOPY;  Service: Gastroenterology;  Laterality: N/A;   ESOPHAGOGASTRODUODENOSCOPY N/A 03/23/2022   Procedure: ESOPHAGOGASTRODUODENOSCOPY (EGD);  Surgeon: Lin Landsman, MD;  Location: [facility] ENDOSCOPY;  Service: Gastroenterology;  Laterality: N/A;   ESOPHAGOGASTRODUODENOSCOPY (EGD) WITH PROPOFOL N/A 04/29/2022    Procedure: ESOPHAGOGASTRODUODENOSCOPY (EGD) WITH PROPOFOL;  Surgeon: Lin Landsman, MD;  Location: [facility] ENDOSCOPY;  Service: Gastroenterology;  Laterality: N/A;   GASTRIC BYPASS  2008     Current Facility-Administered Medications:    0.9 %  sodium chloride infusion, , Intravenous, Continuous, Athena Masse, MD, Last Rate: 75 mL/hr at 07/27/22 0805, Continued from Pre-op at 07/27/22 0805   0.9 %  sodium chloride infusion, , Intravenous, Continuous, Quillan Whitter, Tally Due, MD   acetaminophen (TYLENOL) tablet 650 mg, 650 mg, Oral, Q6H PRN **OR** acetaminophen (TYLENOL) suppository 650 mg, 650 mg, Rectal, Q6H PRN, Athena Masse, MD   enoxaparin (LOVENOX) injection 40 mg, 40 mg, Subcutaneous, Q24H, Judd Gaudier V, MD, 40 mg at 07/26/22 2308   morphine (PF) 2 MG/ML injection 2 mg, 2 mg, Intravenous, Q2H PRN, Athena Masse, MD, 2 mg at 07/27/22 0425   pantoprazole (PROTONIX) injection 40 mg, 40 mg, Intravenous, Q24H, Athena Masse, MD   prochlorperazine (COMPAZINE) injection 10 mg, 10 mg, Intravenous, Q4H PRN, Athena Masse, MD   technetium TC 52M mebrofenin (CHOLETEC) injection 5 millicurie, 5 millicurie, Intravenous, Once PRN, Jennette Banker, MD   Family History  Problem Relation Age of Onset   Healthy Mother    Prostate cancer Father      Social History   Tobacco Use   Smoking status: Never   Smokeless tobacco: Never  Vaping Use   Vaping Use: Never used  Substance Use Topics   Alcohol use: Yes    Alcohol/week: 6.0 standard drinks of alcohol    Types: 6 Standard drinks or equivalent per week    Comment: 1-2 drinks per day   Drug use: Never    Allergies as of 07/26/2022   (No Known Allergies)    Review of Systems:    All systems reviewed and negative except where noted in HPI.   Physical Exam:  Vital signs in last 24 hours: Temp:  [97.8 F (36.6 C)-98.2 F (36.8 C)] 98.1 F (36.7 C) (09/18 0813) Pulse Rate:  [81-109] 81 (09/18 0813) Resp:  [15-20] 20 (09/18  0813) BP: (116-144)/(57-98) 137/87 (09/18 0813) SpO2:  [93 %-100 %] 100 % (09/18 0813) Weight:  [70.3 kg] 70.3 kg (09/17 1539) Last BM Date :  (PTA) General:   Pleasant, cooperative in NAD Head:  Normocephalic and atraumatic. Eyes:   No icterus.   Conjunctiva pink. PERRLA. Ears:  Normal auditory acuity. Neck:  Supple; no masses or thyroidomegaly Lungs: Respirations even and unlabored. Lungs clear to auscultation bilaterally.   No wheezes, crackles, or rhonchi.  Heart:  Regular rate and rhythm;  Without murmur, clicks, rubs or gallops Abdomen:  Soft, nondistended, epigastric tenderness. Normal bowel sounds. No appreciable masses or hepatomegaly.  No rebound or guarding.  Rectal:  Not performed. Msk:  Symmetrical without gross deformities.  Strength normal Extremities:  Without edema, cyanosis or clubbing. Neurologic:  Alert and oriented x3;  grossly normal neurologically. Skin:  Intact without significant lesions or rashes. Cervical Nodes:  No significant cervical adenopathy. Psych:  Alert and cooperative. Normal affect.  LAB RESULTS:    Latest Ref Rng & Units 07/26/2022    3:45 PM 05/22/2022    7:59 AM 03/16/2022    6:56 AM  CBC  WBC 4.0 -  10.5 K/uL 7.5  4.6  4.2   Hemoglobin 12.0 - 15.0 g/dL 13.0  13.3  14.5   Hematocrit 36.0 - 46.0 % 39.3  39.8  44.5   Platelets 150 - 400 K/uL 251  160  151     BMET    Latest Ref Rng & Units 07/26/2022    3:45 PM 03/16/2022    6:56 AM 12/23/2021    1:32 PM  BMP  Glucose 70 - 99 mg/dL 97  128  86   BUN 6 - 20 mg/dL 9  <5  5   Creatinine 0.44 - 1.00 mg/dL 0.69  0.74  0.76   BUN/Creat Ratio 6 - 22 (calc)   7   Sodium 135 - 145 mmol/L 135  139  135   Potassium 3.5 - 5.1 mmol/L 3.5  3.3  5.6   Chloride 98 - 111 mmol/L 98  106  101   CO2 22 - 32 mmol/L _0 Calcium 8.9 - 10.3 mg/dL 8.8  9.4  9.3     LFT    Latest Ref Rng & Units 07/26/2022    3:45 PM 06/26/2022   11:44 AM 04/23/2022   11:54 AM  Hepatic Function  Total Protein 6.5 -  8.1 g/dL 6.7  6.8  6.7   Albumin 3.5 - 5.0 g/dL 3.0  3.9  3.7   AST 15 - 41 U/L 131  115  218   ALT 0 - 44 U/L 62  47  128   Alk Phosphatase 38 - 126 U/L 168  181  174   Total Bilirubin 0.3 - 1.2 mg/dL 1.3  1.1  1.5   Bilirubin, Direct 0.00 - 0.40 mg/dL  0.50  0.84      STUDIES: CT Angio Chest PE W and/or Wo Contrast  Result Date: 07/26/2022 CLINICAL DATA:  Chest pain and shortness of breath EXAM: CT ANGIOGRAPHY CHEST WITH CONTRAST TECHNIQUE: Multidetector CT imaging of the chest was performed using the standard protocol during bolus administration of intravenous contrast. Multiplanar CT image reconstructions and MIPs were obtained to evaluate the vascular anatomy. RADIATION DOSE REDUCTION: This exam was performed according to the departmental dose-optimization program which includes automated exposure control, adjustment of the mA and/or kV according to patient size and/or use of iterative reconstruction technique. CONTRAST:  188m OMNIPAQUE IOHEXOL 350 MG/ML SOLN COMPARISON:  None Available. FINDINGS: Cardiovascular: Thoracic aorta and its branches show normal appearance without evidence of aneurysmal dilatation or dissection. No cardiac enlargement is noted. No coronary calcifications are seen. The pulmonary artery shows a normal branching pattern bilaterally. No filling defects are identified to suggest pulmonary embolism. Mediastinum/Nodes: Thoracic inlet is within normal limits. No hilar or mediastinal adenopathy is noted. The esophagus as visualized is within normal limits. Lungs/Pleura: Lungs are well aerated bilaterally. Mild bibasilar atelectatic changes are noted. No focal parenchymal nodule or effusion is seen. Upper Abdomen: Visualized upper abdomen demonstrates diffuse fatty infiltration of the liver. Changes of prior gastric bypass are seen. Musculoskeletal: No chest wall abnormality. No acute or significant osseous findings. Review of the MIP images confirms the above findings.  IMPRESSION: No evidence of pulmonary emboli. Fatty liver. Electronically Signed   By: MInez CatalinaM.D.   On: 07/26/2022 19:18   CT ABDOMEN PELVIS W CONTRAST  Result Date: 07/26/2022 CLINICAL DATA:  Postoperative abdominal pain. History of gastric bypass. EXAM: CT ABDOMEN AND PELVIS WITH CONTRAST TECHNIQUE: Multidetector CT imaging of the abdomen and pelvis was performed  using the standard protocol following bolus administration of intravenous contrast. RADIATION DOSE REDUCTION: This exam was performed according to the departmental dose-optimization program which includes automated exposure control, adjustment of the mA and/or kV according to patient size and/or use of iterative reconstruction technique. CONTRAST:  163m OMNIPAQUE IOHEXOL 350 MG/ML SOLN COMPARISON:  Abdominal ultrasound 07/26/2022 FINDINGS: Lower chest: There is minimal atelectasis in the lung bases. Hepatobiliary: Liver is mildly enlarged. There is diffuse fatty infiltration of the liver. There is mild diffuse gallbladder wall thickening. There is no biliary ductal dilatation. Pancreas: Unremarkable. No pancreatic ductal dilatation or surrounding inflammatory changes. Spleen: Normal in size without focal abnormality. Adrenals/Urinary Tract: Adrenal glands are unremarkable. Kidneys are normal, without renal calculi, focal lesion, or hydronephrosis. Bladder is unremarkable. Stomach/Bowel: Patient is status post gastric bypass surgery. No dilated bowel loops, focal wall thickening or inflammation identified. No evidence for bowel obstruction or free air. The appendix is within normal limits. Vascular/Lymphatic: No significant vascular findings are present. No enlarged abdominal or pelvic lymph nodes. Reproductive: There is an IUD in the uterus. Adnexa are within normal limits. Other: No abdominal wall hernia or abnormality. No abdominopelvic ascites. Musculoskeletal: No acute or significant osseous findings. IMPRESSION: 1. Mild gallbladder wall  thickening, indeterminate. Correlate clinically for cholecystitis. 2. Hepatomegaly and hepatic steatosis. 3. Gastric bypass changes without complication. 4. IUD in the uterus. Electronically Signed   By: ARonney AstersM.D.   On: 07/26/2022 19:18   [place] LIMITED RUQ (LIVER/GB)  Result Date: 07/26/2022 CLINICAL DATA:  Right upper quadrant pain EXAM: ULTRASOUND ABDOMEN LIMITED RIGHT UPPER QUADRANT COMPARISON:  None Available. FINDINGS: Gallbladder: No gallstones or wall thickening visualized. No sonographic Murphy sign noted by sonographer. Common bile duct: Diameter: 1 mm. Liver: No focal lesion identified. Increased parenchymal echogenicity. Portal vein is patent on color Doppler imaging with normal direction of blood flow towards the liver. Other: None. IMPRESSION: Hepatic steatosis. Please note limited evaluation for focal hepatic masses in a patient with hepatic steatosis due to decreased penetration of the acoustic ultrasound waves. Electronically Signed   By: MIven FinnM.D.   On: 07/26/2022 17:14   DG Chest 2 View  Result Date: 07/26/2022 CLINICAL DATA:  Chest abdominal pain since yesterday EXAM: CHEST - 2 VIEW COMPARISON:  12/23/2021 FINDINGS: Frontal and lateral views of the chest demonstrate an unremarkable cardiac silhouette. No acute airspace disease, effusion, or pneumothorax. No acute bony abnormalities. IMPRESSION: 1. No acute intrathoracic process. Electronically Signed   By: MRanda NgoM.D.   On: 07/26/2022 16:21      Impression / Plan:   JAimee Heldmanis a 51y.o. female with history of Roux-en-Y gastric bypass, esophageal stricture s/p dilation, history of fatty liver disease biopsy-proven presented with epigastric pain and chest pain.  Serum lipase was normal.  LFTs have been elevated which have been chronic.  Right upper quadrant ultrasound revealed gallbladder wall thickening, therefore patient is undergoing HIDA scan.  If HIDA scan is negative for acute  cholecystitis, will proceed with upper endoscopy tomorrow and patient is agreeable  Patient is however inclined for removal of gallbladder because of recurrent symptoms.  We can have surgery evaluated based on the HIDA scan results Protonix 40 mg IV twice daily N.p.o. for now  Thank you for involving me in the care of this patient.  GI will follow along with you    LOS: 0 days   RSherri Sear MD  07/27/2022, 12:28 PM    Note: This dictation was  prepared with Dragon dictation along with smaller phrase technology. Any transcriptional errors that result from this process are unintentional.

## 2022-07-27 NOTE — Progress Notes (Signed)
  PROGRESS NOTE    Monica Leon  JSR:[id number] DOB: October 25, 1971 DOA: 07/26/2022 PCP: Jearld Fenton, NP  141A/141A-AA  LOS: 0 days   Brief hospital course: No notes on file  Assessment & Plan: Monica Leon is a 51 y.o. female with medical history significant for Gastric bypass, esophageal stricture and hypertension who presents to the ED with a 2-day complaint of upper abdominal pain radiating up her chest associated with shortness of breath.   Abdominal pain Etiology uncertain.  Patient has history of bypass, esophageal stricture.  --HIDA scan neg today Plan: --plan for EGD tomorrow --IV PPI BID  Elevated LFT's, chronic --had liver biopsy done in the past which showed just fatty liver.  LFT's currently actually lower than previous.  Weight loss  Vomiting, chronic history of bypass, esophageal stricture --pt reported having vomiting daily, and not able to eat much due to very small gastric volume  Hypomag --replete with IV mag  Anxiety and depression Hold Lexapro while n.p.o.   DVT prophylaxis: Lovenox SQ Code Status: Full code  Family Communication:  Level of care: Med-Surg Dispo:   The patient is from: home Anticipated d/c is to: home Anticipated d/c date is: 1-2 days   Subjective and Interval History:  HIDA scan neg.  Pt continued to have abdominal pain, and also vomited after drinking the Ensure during HIDA scan.   Objective: Vitals:   07/26/22 1830 07/26/22 1852 07/26/22 2228 07/27/22 0813  BP: 119/77  (!) 144/77 137/87  Pulse: 91  87 81  Resp: 17  16 20   Temp:  98.2 F (36.8 C) 97.8 F (36.6 C) 98.1 F (36.7 C)  TempSrc:  Oral  Oral  SpO2: 94%  100% 100%  Weight:      Height:        Intake/Output Summary (Last 24 hours) at 07/27/2022 1937 Last data filed at 07/27/2022 0437 Gross per 24 hour  Intake 584.91 ml  Output --  Net 584.91 ml   Filed Weights   07/26/22 1539  Weight: 70.3 kg    Examination:   Constitutional: NAD,  AAOx3 HEENT: conjunctivae and lids normal, EOMI CV: No cyanosis.   RESP: normal respiratory effort, on RA Neuro: II - XII grossly intact.   Psych: Normal mood and affect.  Appropriate judgement and reason   Data Reviewed: I have personally reviewed labs and imaging studies  Time spent: 50 minutes  Enzo Bi, MD [facility] If 7PM-7AM, please contact night-coverage 07/27/2022, 7:37 PM

## 2022-07-27 NOTE — Plan of Care (Signed)

## 2022-07-27 NOTE — TOC Initial Note (Signed)
Transition of Care [facility]) - Initial/Assessment Note    Patient Details  Name: Monica Leon MRN: [id number] Date of Birth: 1971-09-12  Transition of Care [facility]) CM/SW Contact:    Conception Oms, RN Phone Number: 07/27/2022, 10:06 AM  Clinical Narrative:                  Transition of Care (TOC) Screening Note   Patient Details  Name: Monica Leon Date of Birth: 1971-10-01   Transition of Care [facility]) CM/SW Contact:    Conception Oms, RN Phone Number: 07/27/2022, 10:06 AM    Transition of Care Department [facility]) has reviewed patient and no TOC needs have been identified at this time. We will continue to monitor patient advancement through interdisciplinary progression rounds. If new patient transition needs arise, please place a TOC consult.          Patient Goals and CMS Choice        Expected Discharge Plan and Services                                                Prior Living Arrangements/Services                       Activities of Daily Living Home Assistive Devices/Equipment: None ADL Screening (condition at time of admission) Patient's cognitive ability adequate to safely complete daily activities?: Yes Is the patient deaf or have difficulty hearing?: No Does the patient have difficulty seeing, even when wearing glasses/contacts?: No Does the patient have difficulty concentrating, remembering, or making decisions?: No Patient able to express need for assistance with ADLs?: Yes Does the patient have difficulty dressing or bathing?: No Independently performs ADLs?: Yes (appropriate for developmental age) Does the patient have difficulty walking or climbing stairs?: No Weakness of Legs: None Weakness of Arms/Hands: None  Permission Sought/Granted                  Emotional Assessment              Admission diagnosis:  Hypomagnesemia [E83.42] QT prolongation [R94.31] Abdominal pain [R10.9] Abdominal pain,  unspecified abdominal location [R10.9] Patient Active Problem List   Diagnosis Date Noted   Abnormal LFTs 07/26/2022   Hepatic steatosis 07/26/2022   Abdominal pain 07/26/2022   History of gastric bypass 07/26/2022   History of esophageal stricture 07/26/2022   Prolonged QT interval 07/26/2022   Hypomagnesemia 07/26/2022   Stricture and stenosis of esophagus    HTN (hypertension) 08/18/2021   Anxiety and depression 08/18/2021   GERD (gastroesophageal reflux disease) 08/18/2021   Class 1 obesity due to excess calories with body mass index (BMI) of 33.0 to 33.9 in adult 08/18/2021   PCP:  Jearld Fenton, NP Pharmacy:   [facility] [place] [place] [address] Phone: [phone] Fax: [phone]     Social Determinants of Health (SDOH) Interventions    Readmission Risk Interventions     No data to display

## 2022-07-27 NOTE — Progress Notes (Signed)
Initial Nutrition Assessment  DOCUMENTATION CODES:   Not applicable  INTERVENTION:   -RD will follow for diet advancement and add supplements as appropriate -RD will check labs to evaluate for potential micronutrient deficiencies: iron, folic acid, thiamine, copper, zinc, calcium, and vitamins D, A, E, and K  NUTRITION DIAGNOSIS:   Inadequate oral intake related to altered GI function as evidenced by NPO status.  GOAL:   Patient will meet greater than or equal to 90% of their needs  MONITOR:   PO intake, Supplement acceptance, Diet advancement  REASON FOR ASSESSMENT:   Malnutrition Screening Tool    ASSESSMENT:   Pt with medical history significant for Gastric bypass, esophageal stricture and hypertension who presents with a 2-day complaint of upper abdominal pain radiating up her chest associated with shortness of  Pt admitted with abdominal pain, abnormal LFTs, hepatic steatosis, and history of gastric bypass and esophageal stricture.   Reviewed I/O's: +1.6 L x 24 hours    Pt unavailable at time of visit. Attempted to speak with pt via call to hospital room phone, however, unable to reach. RD unable to obtain further nutrition-related history or complete nutrition-focused physical exam at this time.    Pt currently NPO, awaiting GI consult. Noted pt with distant history of gastric bypass surgery at [facility]   Reviewed wt hx; pt has experienced a 14.9% wt loss over the past 3 months, which is significant for time frame. Highly suspect malnutrition, however, unable to identify at this time. RD will also check vitamin labs for possible deficiencies secondary to history of gastric bypass.   Medications reviewed and include lovenox and 0.9% sodium chloride infusion @ 75 ml/hr.   Lab Results  Component Value Date   HGBA1C 4.7 12/23/2021   PTA DM medications are 500 mg metformin BID   Labs reviewed.   Diet Order:   Diet Order             Diet NPO time  specified Except for: Ice Chips  Diet effective now                   EDUCATION NEEDS:   No education needs have been identified at this time  Skin:  Skin Assessment: Reviewed RN Assessment  Last BM:  Unknown  Height:   Ht Readings from Last 1 Encounters:  07/26/22 5' 4"  (1.626 m)    Weight:   Wt Readings from Last 1 Encounters:  07/26/22 70.3 kg    Ideal Body Weight:  56.8 kg  BMI:  Body mass index is 26.61 kg/m.  Estimated Nutritional Needs:   Kcal:  1800-2000  Protein:  100-115 grams  Fluid:  > 1.8 L    Loistine Chance, RD, LDN, [place] Registered Dietitian II Certified Diabetes Care and Education Specialist Please refer to [facility] for RD and/or RD on-call/weekend/after hours pager

## 2022-07-28 ENCOUNTER — Observation Stay: Payer: BC Managed Care – PPO | Admitting: Certified Registered Nurse Anesthetist

## 2022-07-28 ENCOUNTER — Encounter: Payer: Self-pay | Admitting: Hospitalist

## 2022-07-28 ENCOUNTER — Encounter
Admission: EM | Disposition: A | Discharge status: Home / Self Care | Payer: Self-pay | Source: Home / Self Care | Attending: Emergency Medicine

## 2022-07-28 DIAGNOSIS — K2210 Ulcer of esophagus without bleeding: Principal | ICD-10-CM

## 2022-07-28 DIAGNOSIS — R1013 Epigastric pain: Secondary | ICD-10-CM

## 2022-07-28 HISTORY — PX: ESOPHAGOGASTRODUODENOSCOPY (EGD) WITH PROPOFOL: SHX5813

## 2022-07-28 SURGERY — ESOPHAGOGASTRODUODENOSCOPY (EGD) WITH PROPOFOL
Anesthesia: General

## 2022-07-28 MED ORDER — LIDOCAINE HCL (CARDIAC) PF 100 MG/5ML IV SOSY
PREFILLED_SYRINGE | INTRAVENOUS | Status: DC | PRN
  Administered 2022-07-28: 100 mg via INTRAVENOUS

## 2022-07-28 MED ORDER — PROPOFOL 500 MG/50ML IV EMUL
INTRAVENOUS | Status: DC | PRN
  Administered 2022-07-28: 160 ug/kg/min via INTRAVENOUS

## 2022-07-28 MED ORDER — PHENYLEPHRINE 80 MCG/ML (10ML) SYRINGE FOR IV PUSH (FOR BLOOD PRESSURE SUPPORT)
PREFILLED_SYRINGE | INTRAVENOUS | Status: DC | PRN
  Administered 2022-07-28: 160 ug via INTRAVENOUS
  Administered 2022-07-28: 80 ug via INTRAVENOUS

## 2022-07-28 MED ORDER — GLYCOPYRROLATE 0.2 MG/ML IJ SOLN
INTRAMUSCULAR | Status: DC | PRN
  Administered 2022-07-28: .2 mg via INTRAVENOUS

## 2022-07-28 MED ORDER — FOLIC ACID 1 MG PO TABS: 1.0000 mg | ORAL_TABLET | Freq: Every day | ORAL | Status: DC

## 2022-07-28 MED ORDER — PROPOFOL 10 MG/ML IV BOLUS
INTRAVENOUS | Status: DC | PRN
  Administered 2022-07-28 (×2): 80 mg via INTRAVENOUS

## 2022-07-28 NOTE — Anesthesia Procedure Notes (Signed)
Date/Time: 07/28/2022 12:50 PM  Performed by: Lily Peer, Janeli Lewison, CRNAPre-anesthesia Checklist: Patient identified, Emergency Drugs available, Suction available, Patient being monitored and Timeout performed Patient Re-evaluated:Patient Re-evaluated prior to induction Oxygen Delivery Method: Nasal cannula Induction Type: IV induction

## 2022-07-28 NOTE — Anesthesia Postprocedure Evaluation (Signed)
Anesthesia Post Note  Patient: Monica Leon  Procedure(s) Performed: ESOPHAGOGASTRODUODENOSCOPY (EGD) WITH PROPOFOL  Patient location during evaluation: Endoscopy Anesthesia Type: General Level of consciousness: awake and alert Pain management: pain level controlled Vital Signs Assessment: post-procedure vital signs reviewed and stable Respiratory status: spontaneous breathing, nonlabored ventilation, respiratory function stable and patient connected to nasal cannula oxygen Cardiovascular status: blood pressure returned to baseline and stable Postop Assessment: no apparent nausea or vomiting Anesthetic complications: no   No notable events documented.   Last Vitals:  Vitals:   07/28/22 1313 07/28/22 1317  BP: 117/87   Pulse:  (!) 8  Resp:    Temp:    SpO2:      Last Pain:  Vitals:   07/28/22 1323  TempSrc:   PainSc: 0-No pain                 Ilene Qua

## 2022-07-28 NOTE — Transfer of Care (Signed)
Immediate Anesthesia Transfer of Care Note  Patient: Monica Leon  Procedure(s) Performed: ESOPHAGOGASTRODUODENOSCOPY (EGD) WITH PROPOFOL  Patient Location: PACU and Endoscopy Unit  Anesthesia Type:General  Level of Consciousness: drowsy  Airway & Oxygen Therapy: Patient Spontanous Breathing  Post-op Assessment: Report given to RN  Post vital signs: stable  Last Vitals:  Vitals Value Taken Time  BP 94/71 07/28/22 1302  Temp    Pulse 85 07/28/22 1303  Resp 15 07/28/22 1303  SpO2 98 % 07/28/22 1303  Vitals shown include unvalidated device data.  Last Pain:  Vitals:   07/28/22 1226  TempSrc: Temporal  PainSc: 0-No pain         Complications: No notable events documented.

## 2022-07-28 NOTE — Progress Notes (Signed)
Patient was given verbal and written discharge instructions, she acknowledge understanding and states she will comply and keep all appointments. Taken to car by wheelchair, no distress noted when leaving the floor.

## 2022-07-28 NOTE — Anesthesia Preprocedure Evaluation (Signed)
Anesthesia Evaluation  Patient identified by MRN, date of birth, ID band Patient awake    Reviewed: Allergy & Precautions, H&P , NPO status , Patient's Chart, lab work & pertinent test results, reviewed documented beta blocker date and time   History of Anesthesia Complications Negative for: history of anesthetic complications  Airway Mallampati: II   Neck ROM: full    Dental  (+) Poor Dentition   Pulmonary neg pulmonary ROS,    Pulmonary exam normal        Cardiovascular Exercise Tolerance: Good hypertension, On Medications negative cardio ROS Normal cardiovascular exam Rhythm:regular Rate:Normal     Neuro/Psych  Headaches, PSYCHIATRIC DISORDERS Anxiety Depression    GI/Hepatic GERD  Medicated,(+) Hepatitis -NASH Abdominal pain, n/v last yesterday afternoon    Endo/Other  negative endocrine ROS  Renal/GU negative Renal ROS  negative genitourinary   Musculoskeletal   Abdominal   Peds  Hematology negative hematology ROS (+)   Anesthesia Other Findings Past Medical History: No date: Anxiety No date: Depression No date: GERD (gastroesophageal reflux disease) No date: Hypertension No date: Migraine Past Surgical History: 03/23/2022: COLONOSCOPY WITH PROPOFOL; N/A     Comment:  Procedure: COLONOSCOPY WITH PROPOFOL;  Surgeon: Lin Landsman, MD;  Location: [facility] ENDOSCOPY;  Service:               Gastroenterology;  Laterality: N/A; 03/23/2022: ESOPHAGOGASTRODUODENOSCOPY; N/A     Comment:  Procedure: ESOPHAGOGASTRODUODENOSCOPY (EGD);  Surgeon:               Lin Landsman, MD;  Location: [facility] ENDOSCOPY;                Service: Gastroenterology;  Laterality: N/A; 2008: GASTRIC BYPASS BMI    Body Mass Index: 31.24 kg/m     Reproductive/Obstetrics negative OB ROS                             Anesthesia Physical  Anesthesia Plan  ASA: 2  Anesthesia Plan:  General   Post-op Pain Management: Minimal or no pain anticipated   Induction:   PONV Risk Score and Plan: 2 and Propofol infusion and TIVA  Airway Management Planned: Nasal Cannula and Natural Airway  Additional Equipment:   Intra-op Plan:   Post-operative Plan:   Informed Consent: I have reviewed the patients History and Physical, chart, labs and discussed the procedure including the risks, benefits and alternatives for the proposed anesthesia with the patient or authorized representative who has indicated his/her understanding and acceptance.     Dental Advisory Given  Plan Discussed with: CRNA  Anesthesia Plan Comments:         Anesthesia Quick Evaluation

## 2022-07-28 NOTE — Discharge Summary (Signed)
Physician Discharge Summary   Monica Leon  female DOB: [birth year]  ZGY:[id number]  PCP: Jearld Fenton, NP  Admit date: 07/26/2022 Discharge date: 07/28/2022  Admitted From: home Disposition:  home CODE STATUS: Full code  Discharge Instructions     Diet general   Complete by: As directed       Hospital Course:  For full details, please see H&P, progress notes, consult notes and ancillary notes.  Briefly,  Monica Leon is a 51 y.o. female with medical history significant for Gastric bypass, esophageal stricture and hypertension who presented to the ED with a 2-day complaint of upper abdominal pain radiating up her chest associated with shortness of breath.   Abdominal pain Etiology uncertain.  Patient has history of bypass, esophageal stricture.  CT a/p on presentation only noted for Mild gallbladder wall thickening, so HIDA performed which was neg.  CTA chest on presentation also neg. --EGD during current hospitalization found One superficial esophageal ulcer with no bleeding and no stigmata of recent bleeding.   --cont home PPI BID and f/u with GI Dr. Marius Ditch as outpatient.  Elevated LFT's, chronic --had liver biopsy done in the past which showed just fatty liver.  LFT's currently actually lower than previous.   Weight loss  Vomiting, chronic history of bypass, esophageal stricture --pt reported having vomiting daily, and not able to eat much due to very small gastric volume   Hypomag --repleted with IV mag   Anxiety and depression --cont home Lexapro    Unless noted above, medications under "STOP" list are ones pt was not taking PTA.  Discharge Diagnoses:  Principal Problem:   Abdominal pain Active Problems:   HTN (hypertension)   Anxiety and depression   Abnormal LFTs   Hepatic steatosis   History of gastric bypass   History of esophageal stricture   Prolonged QT interval   Hypomagnesemia   Ulcer of esophagus without bleeding   30 Day  Unplanned Readmission Risk Score    Flowsheet Row ED to Hosp-Admission (Current) from 07/26/2022 in [facility]  30 Day Unplanned Readmission Risk Score (%) 11.75 Filed at 07/28/2022 1200       This score is the patient's risk of an unplanned readmission within 30 days of being discharged (0 -100%). The score is based on dignosis, age, lab data, medications, orders, and past utilization.   Low:  0-14.9   Medium: 15-21.9   High: 22-29.9   Extreme: 30 and above         Discharge Instructions:  Allergies as of 07/28/2022   No Known Allergies      Medication List     STOP taking these medications    fluticasone 110 MCG/ACT inhaler Commonly known as: Flovent HFA       TAKE these medications    escitalopram 20 MG tablet Commonly known as: LEXAPRO Take 20 mg by mouth daily.   folic acid 1 MG tablet Commonly known as: FOLVITE Take 1 tablet (1 mg total) by mouth daily.   metFORMIN 500 MG tablet Commonly known as: GLUCOPHAGE TAKE 1 TABLET(500 MG) BY MOUTH TWICE DAILY WITH A MEAL   metoCLOPramide 10 MG tablet Commonly known as: REGLAN Take 1 tablet (10 mg total) by mouth every 8 (eight) hours as needed for nausea.   multivitamin tablet Take 1 tablet by mouth daily.   omeprazole 40 MG capsule Commonly known as: PRILOSEC Take 1 capsule (40 mg total) by mouth 2 (two) times daily before  a meal.   ondansetron 4 MG disintegrating tablet Commonly known as: ZOFRAN-ODT Take 1 tablet (4 mg total) by mouth every 8 (eight) hours as needed for nausea or vomiting.         Follow-up Information     Jearld Fenton, NP Follow up in 1 week(s).   Specialties: Internal Medicine, Emergency Medicine Contact information: [place] [address] [phone]                 No Known Allergies   The results of significant diagnostics from this hospitalization (including imaging, microbiology, ancillary and laboratory) are listed  below for reference.   Consultations:   Procedures/Studies: NM Hepato W/EF  Result Date: 07/27/2022 CLINICAL DATA:  Right upper quadrant pain EXAM: NUCLEAR MEDICINE HEPATOBILIARY IMAGING WITH GALLBLADDER EF TECHNIQUE: Sequential images of the abdomen were obtained out to 60 minutes following intravenous administration of radiopharmaceutical. After oral ingestion of Ensure, gallbladder ejection fraction was determined. At 60 min, normal ejection fraction is greater than 33%. RADIOPHARMACEUTICALS:  4.6 mCi Tc-1m Choletec IV COMPARISON:  None Available. FINDINGS: Prompt uptake and biliary excretion of activity by the liver is seen. Gallbladder activity is visualized, consistent with patency of cystic duct. Biliary activity passes into small bowel, consistent with patent common bile duct. Patient abruptly had to use the restroom 15 minutes following ingestion of Ensure for a period of 10 minutes. Imaging was resumed after that. Calculated gallbladder ejection fraction is 41%. (Normal gallbladder ejection fraction with Ensure is greater than 33%.) IMPRESSION: Normal HIDA scan. No hepatic biliary obstruction. Normal gallbladder ejection fraction. Electronically Signed   By: HKathreen DevoidM.D.   On: 07/27/2022 15:44   CT Angio Chest PE W and/or Wo Contrast  Result Date: 07/26/2022 CLINICAL DATA:  Chest pain and shortness of breath EXAM: CT ANGIOGRAPHY CHEST WITH CONTRAST TECHNIQUE: Multidetector CT imaging of the chest was performed using the standard protocol during bolus administration of intravenous contrast. Multiplanar CT image reconstructions and MIPs were obtained to evaluate the vascular anatomy. RADIATION DOSE REDUCTION: This exam was performed according to the departmental dose-optimization program which includes automated exposure control, adjustment of the mA and/or kV according to patient size and/or use of iterative reconstruction technique. CONTRAST:  105mOMNIPAQUE IOHEXOL 350 MG/ML SOLN  COMPARISON:  None Available. FINDINGS: Cardiovascular: Thoracic aorta and its branches show normal appearance without evidence of aneurysmal dilatation or dissection. No cardiac enlargement is noted. No coronary calcifications are seen. The pulmonary artery shows a normal branching pattern bilaterally. No filling defects are identified to suggest pulmonary embolism. Mediastinum/Nodes: Thoracic inlet is within normal limits. No hilar or mediastinal adenopathy is noted. The esophagus as visualized is within normal limits. Lungs/Pleura: Lungs are well aerated bilaterally. Mild bibasilar atelectatic changes are noted. No focal parenchymal nodule or effusion is seen. Upper Abdomen: Visualized upper abdomen demonstrates diffuse fatty infiltration of the liver. Changes of prior gastric bypass are seen. Musculoskeletal: No chest wall abnormality. No acute or significant osseous findings. Review of the MIP images confirms the above findings. IMPRESSION: No evidence of pulmonary emboli. Fatty liver. Electronically Signed   By: MaInez Catalina.D.   On: 07/26/2022 19:18   CT ABDOMEN PELVIS W CONTRAST  Result Date: 07/26/2022 CLINICAL DATA:  Postoperative abdominal pain. History of gastric bypass. EXAM: CT ABDOMEN AND PELVIS WITH CONTRAST TECHNIQUE: Multidetector CT imaging of the abdomen and pelvis was performed using the standard protocol following bolus administration of intravenous contrast. RADIATION DOSE REDUCTION: This exam  was performed according to the departmental dose-optimization program which includes automated exposure control, adjustment of the mA and/or kV according to patient size and/or use of iterative reconstruction technique. CONTRAST:  140m OMNIPAQUE IOHEXOL 350 MG/ML SOLN COMPARISON:  Abdominal ultrasound 07/26/2022 FINDINGS: Lower chest: There is minimal atelectasis in the lung bases. Hepatobiliary: Liver is mildly enlarged. There is diffuse fatty infiltration of the liver. There is mild diffuse  gallbladder wall thickening. There is no biliary ductal dilatation. Pancreas: Unremarkable. No pancreatic ductal dilatation or surrounding inflammatory changes. Spleen: Normal in size without focal abnormality. Adrenals/Urinary Tract: Adrenal glands are unremarkable. Kidneys are normal, without renal calculi, focal lesion, or hydronephrosis. Bladder is unremarkable. Stomach/Bowel: Patient is status post gastric bypass surgery. No dilated bowel loops, focal wall thickening or inflammation identified. No evidence for bowel obstruction or free air. The appendix is within normal limits. Vascular/Lymphatic: No significant vascular findings are present. No enlarged abdominal or pelvic lymph nodes. Reproductive: There is an IUD in the uterus. Adnexa are within normal limits. Other: No abdominal wall hernia or abnormality. No abdominopelvic ascites. Musculoskeletal: No acute or significant osseous findings. IMPRESSION: 1. Mild gallbladder wall thickening, indeterminate. Correlate clinically for cholecystitis. 2. Hepatomegaly and hepatic steatosis. 3. Gastric bypass changes without complication. 4. IUD in the uterus. Electronically Signed   By: ARonney AstersM.D.   On: 07/26/2022 19:18   UK[place]ABDOMEN LIMITED RUQ (LIVER/GB)  Result Date: 07/26/2022 CLINICAL DATA:  Right upper quadrant pain EXAM: ULTRASOUND ABDOMEN LIMITED RIGHT UPPER QUADRANT COMPARISON:  None Available. FINDINGS: Gallbladder: No gallstones or wall thickening visualized. No sonographic Murphy sign noted by sonographer. Common bile duct: Diameter: 1 mm. Liver: No focal lesion identified. Increased parenchymal echogenicity. Portal vein is patent on color Doppler imaging with normal direction of blood flow towards the liver. Other: None. IMPRESSION: Hepatic steatosis. Please note limited evaluation for focal hepatic masses in a patient with hepatic steatosis due to decreased penetration of the acoustic ultrasound waves. Electronically Signed   By: MIven FinnM.D.   On: 07/26/2022 17:14   DG Chest 2 View  Result Date: 07/26/2022 CLINICAL DATA:  Chest abdominal pain since yesterday EXAM: CHEST - 2 VIEW COMPARISON:  12/23/2021 FINDINGS: Frontal and lateral views of the chest demonstrate an unremarkable cardiac silhouette. No acute airspace disease, effusion, or pneumothorax. No acute bony abnormalities. IMPRESSION: 1. No acute intrathoracic process. Electronically Signed   By: MRanda NgoM.D.   On: 07/26/2022 16:21      Labs: BNP (last 3 results) No results for input(s): "BNP" in the last 8760 hours. Basic Metabolic Panel: Recent Labs  Lab 07/26/22 1545  NA 135  K 3.5  CL 98  CO2 23  GLUCOSE 97  BUN 9  CREATININE 0.69  CALCIUM 8.8*  MG 1.5*   Liver Function Tests: Recent Labs  Lab 07/26/22 1545  AST 131*  ALT 62*  ALKPHOS 168*  BILITOT 1.3*  PROT 6.7  ALBUMIN 3.0*   Recent Labs  Lab 07/26/22 1545  LIPASE 27   No results for input(s): "AMMONIA" in the last 168 hours. CBC: Recent Labs  Lab 07/26/22 1545  WBC 7.5  HGB 13.0  HCT 39.3  MCV 99.5  PLT 251   Cardiac Enzymes: No results for input(s): "CKTOTAL", "CKMB", "CKMBINDEX", "TROPONINI" in the last 168 hours. BNP: Invalid input(s): "POCBNP" CBG: No results for input(s): "GLUCAP" in the last 168 hours. D-Dimer Recent Labs    07/26/22 1659  DDIMER 0.65*   Hgb A1c No results  for input(s): "HGBA1C" in the last 72 hours. Lipid Profile No results for input(s): "CHOL", "HDL", "LDLCALC", "TRIG", "CHOLHDL", "LDLDIRECT" in the last 72 hours. Thyroid function studies No results for input(s): "TSH", "T4TOTAL", "T3FREE", "THYROIDAB" in the last 72 hours.  Invalid input(s): "FREET3" Anemia work up Recent Labs    07/27/22 1435  VITAMINB12 1,311*  FOLATE 5.2*  IRON 65   Urinalysis    Component Value Date/Time   COLORURINE YELLOW (A) 07/27/2022 0809   APPEARANCEUR CLEAR (A) 07/27/2022 0809   LABSPEC 1.031 (H) 07/27/2022 0809   PHURINE 5.0  07/27/2022 0809   GLUCOSEU NEGATIVE 07/27/2022 0809   HGBUR NEGATIVE 07/27/2022 0809   BILIRUBINUR NEGATIVE 07/27/2022 0809   KETONESUR 20 (A) 07/27/2022 0809   PROTEINUR NEGATIVE 07/27/2022 0809   NITRITE NEGATIVE 07/27/2022 0809   LEUKOCYTESUR TRACE (A) 07/27/2022 0809   Sepsis Labs Recent Labs  Lab 07/26/22 1545  WBC 7.5   Microbiology Recent Results (from the past 240 hour(s))  SARS Coronavirus 2 by RT PCR (hospital order, performed in [place] hospital lab) *cepheid single result test* Anterior Nasal Swab     Status: None   Collection Time: 07/26/22  4:59 PM   Specimen: Anterior Nasal Swab  Result Value Ref Range Status   SARS Coronavirus 2 by RT PCR NEGATIVE NEGATIVE Final    Comment: (NOTE) SARS-CoV-2 target nucleic acids are NOT DETECTED.  The SARS-CoV-2 RNA is generally detectable in upper and lower respiratory specimens during the acute phase of infection. The lowest concentration of SARS-CoV-2 viral copies this assay can detect is 250 copies / mL. A negative result does not preclude SARS-CoV-2 infection and should not be used as the sole basis for treatment or other patient management decisions.  A negative result may occur with improper specimen collection / handling, submission of specimen other than nasopharyngeal swab, presence of viral mutation(s) within the areas targeted by this assay, and inadequate number of viral copies (<250 copies / mL). A negative result must be combined with clinical observations, patient history, and epidemiological information.  Fact Sheet for Patients:   https://www.patel.info/  Fact Sheet for Healthcare Providers: https://hall.com/  This test is not yet approved or  cleared by the [place] FDA and has been authorized for detection and/or diagnosis of SARS-CoV-2 by FDA under an Emergency Use Authorization (EUA).  This EUA will remain in effect (meaning this test can be used)  for the duration of the COVID-19 declaration under Section 564(b)(1) of the Act, 21 U.S.C. section 360bbb-3(b)(1), unless the authorization is terminated or revoked sooner.  Performed at [facility], [address] [place], [place] [address]      Total time spend on discharging this patient, including the last patient exam, discussing the hospital stay, instructions for ongoing care as it relates to all pertinent caregivers, as well as preparing the medical discharge records, prescriptions, and/or referrals as applicable, is 30 minutes.    Enzo Bi, MD  [facility] 07/28/2022, 1:17 PM

## 2022-07-28 NOTE — Op Note (Signed)
[facility] Patient Name: Monica Leon Procedure Date: 07/28/2022 12:39 PM MRN: [id number] Account #: [id number] Date of Birth: Aug 19, 1971 Admit Type: Inpatient Age: 51 Room: [facility] ENDO ROOM 3 Gender: Female Note Status: Finalized Instrument Name: Upper Endoscope [id number] Procedure:             Upper GI endoscopy Indications:           Epigastric abdominal pain Providers:             Lin Landsman MD, MD Referring MD:          Jearld Fenton (Referring MD) Medicines:             General Anesthesia Complications:         No immediate complications. Estimated blood loss: None. Procedure:             Pre-Anesthesia Assessment:                        - Prior to the procedure, a History and Physical was                         performed, and patient medications and allergies were                         reviewed. The patient is competent. The risks and                         benefits of the procedure and the sedation options and                         risks were discussed with the patient. All questions                         were answered and informed consent was obtained.                         Patient identification and proposed procedure were                         verified by the physician, the nurse, the                         anesthesiologist, the anesthetist and the technician                         in the pre-procedure area in the procedure room in the                         endoscopy suite. Mental Status Examination: alert and                         oriented. Airway Examination: normal oropharyngeal                         airway and neck mobility. Respiratory Examination:                         clear to auscultation. CV Examination: normal.  Prophylactic Antibiotics: The patient does not require                         prophylactic antibiotics. Prior Anticoagulants: The                          patient has taken no previous anticoagulant or                         antiplatelet agents. ASA Grade Assessment: II - A                         patient with mild systemic disease. After reviewing                         the risks and benefits, the patient was deemed in                         satisfactory condition to undergo the procedure. The                         anesthesia plan was to use general anesthesia.                         Immediately prior to administration of medications,                         the patient was re-assessed for adequacy to receive                         sedatives. The heart rate, respiratory rate, oxygen                         saturations, blood pressure, adequacy of pulmonary                         ventilation, and response to care were monitored                         throughout the procedure. The physical status of the                         patient was re-assessed after the procedure.                        After obtaining informed consent, the endoscope was                         passed under direct vision. Throughout the procedure,                         the patient's blood pressure, pulse, and oxygen                         saturations were monitored continuously. The Endoscope                         was introduced through the mouth, and advanced to the  afferent jejunal loop. The upper GI endoscopy was                         accomplished without difficulty. The patient tolerated                         the procedure well. Findings:      Evidence of a Roux-en-Y gastrojejunostomy was found. The gastrojejunal       anastomosis was characterized by healthy appearing mucosa. This was       traversed. The pouch-to-jejunum limb was characterized by healthy       appearing mucosa. The jejunojejunal anastomosis was characterized by       healthy appearing mucosa.      Esophagogastric landmarks were identified: the  gastroesophageal junction       was found at 35 cm from the incisors.      One superficial esophageal ulcer with no bleeding and no stigmata of       recent bleeding was found 34 cm from the incisors. The lesion was 5 mm       in largest dimension. Impression:            - Roux-en-Y gastrojejunostomy with gastrojejunal                         anastomosis characterized by healthy appearing mucosa.                        - Esophagogastric landmarks identified.                        - Esophageal ulcer with no bleeding and no stigmata of                         recent bleeding.                        - No specimens collected. Recommendation:        - Return patient to hospital ward for possible                         discharge same day.                        - Advance diet as tolerated today.                        - Continue present medications.                        - Return to my office as previously scheduled. Procedure Code(s):     --- Professional ---                        [id number], Esophagogastroduodenoscopy, flexible,                         transoral; diagnostic, including collection of                         specimen(s) by brushing or washing, when performed                         (  separate procedure) Diagnosis Code(s):     --- Professional ---                        Z98.0, Intestinal bypass and anastomosis status                        K22.10, Ulcer of esophagus without bleeding                        R10.13, Epigastric pain CPT copyright 2019 American Medical Association. All rights reserved. The codes documented in this report are preliminary and upon coder review may  be revised to meet current compliance requirements. Dr. Ulyess Mort Lin Landsman MD, MD 07/28/2022 1:01:01 PM This report has been signed electronically. Number of Addenda: 0 Note Initiated On: 07/28/2022 12:39 PM Estimated Blood Loss:  Estimated blood loss: none.      [facility]

## 2022-07-28 NOTE — Progress Notes (Signed)
Patient was a yellow mew in EGD and not on the floor.

## 2022-07-29 ENCOUNTER — Encounter: Payer: Self-pay | Admitting: Gastroenterology

## 2022-07-29 ENCOUNTER — Telehealth: Payer: Self-pay

## 2022-07-29 NOTE — Telephone Encounter (Signed)
Transition Care Management Follow-up Telephone Call Date of discharge and from where: 07/28/2022  How have you been since you were released from the hospital? Good Any questions or concerns? No  Items Reviewed: Did the pt receive and understand the discharge instructions provided? Yes  Medications obtained and verified? Yes  Other? No  Any new allergies since your discharge? No  Dietary orders reviewed? Yes Do you have support at home? No   Home Care and Equipment/Supplies: Were home health services ordered? no If so, what is the name of the agency?   Has the agency set up a time to come to the patient's home? not applicable Were any new equipment or medical supplies ordered?  No What is the name of the medical supply agency?  Were you able to get the supplies/equipment? no Do you have any questions related to the use of the equipment or supplies? No  Functional Questionnaire: (I = Independent and D = Dependent) ADLs: I   Bathing/Dressing- I  Meal Prep- I  Eating- I  Maintaining continence- I  Transferring/Ambulation- I  Managing Meds- I  Follow up appointments reviewed:  PCP Hospital f/u appt confirmed? Yes  Scheduled to see Webb Silversmith, NP on 08/04/22 @ 2:00. [facility] f/u appt confirmed? Yes  Scheduled to see Dr. Marius Ditch on 08/25/22 @ 2:00. Are transportation arrangements needed? No  If their condition worsens, is the pt aware to call PCP or go to the Emergency Dept.? Yes Was the patient provided with contact information for the PCP's office or ED? Yes Was to pt encouraged to call back with questions or concerns? Yes

## 2022-07-30 LAB — VITAMIN E
Vitamin E (Alpha Tocopherol): 11.9 mg/L (ref 7.0–25.1)
Vitamin E(Gamma Tocopherol): 1.4 mg/L (ref 0.5–5.5)

## 2022-07-30 LAB — VITAMIN A: Vitamin A (Retinoic Acid): 25.5 ug/dL (ref 20.1–62.0)

## 2022-07-30 LAB — COPPER, SERUM: Copper: 88 ug/dL (ref 80–158)

## 2022-07-30 LAB — ZINC: Zinc: 43 ug/dL — ABNORMAL LOW (ref 44–115)

## 2022-07-31 LAB — VITAMIN K1, SERUM: VITAMIN K1: 0.13 ng/mL (ref 0.10–2.20)

## 2022-08-02 LAB — VITAMIN B1: Vitamin B1 (Thiamine): 33 nmol/L — ABNORMAL LOW (ref 66.5–200.0)

## 2022-08-04 ENCOUNTER — Ambulatory Visit: Payer: BC Managed Care – PPO | Admitting: Internal Medicine

## 2022-08-04 ENCOUNTER — Encounter: Payer: Self-pay | Admitting: Internal Medicine

## 2022-08-04 VITALS — BP 116/68 | HR 117 | Temp 97.3°F | Wt 154.0 lb

## 2022-08-04 DIAGNOSIS — K760 Fatty (change of) liver, not elsewhere classified: Secondary | ICD-10-CM | POA: Diagnosis not present

## 2022-08-04 DIAGNOSIS — G8929 Other chronic pain: Secondary | ICD-10-CM

## 2022-08-04 DIAGNOSIS — K2210 Ulcer of esophagus without bleeding: Secondary | ICD-10-CM | POA: Diagnosis not present

## 2022-08-04 DIAGNOSIS — K219 Gastro-esophageal reflux disease without esophagitis: Secondary | ICD-10-CM

## 2022-08-04 DIAGNOSIS — R110 Nausea: Secondary | ICD-10-CM

## 2022-08-04 DIAGNOSIS — Z8719 Personal history of other diseases of the digestive system: Secondary | ICD-10-CM | POA: Diagnosis not present

## 2022-08-04 DIAGNOSIS — R109 Unspecified abdominal pain: Secondary | ICD-10-CM

## 2022-08-04 MED ORDER — MIRTAZAPINE 7.5 MG PO TABS: 7.5000 mg | ORAL_TABLET | Freq: Every day | ORAL | 0 refills | Status: DC

## 2022-08-04 MED ORDER — TRAMADOL HCL 50 MG PO TABS: 50.0000 mg | ORAL_TABLET | Freq: Every day | ORAL | 0 refills | Status: DC | PRN

## 2022-08-04 NOTE — Patient Instructions (Signed)
Abdominal Pain, Adult Many things can cause belly (abdominal) pain. Most times, belly pain is not dangerous. Many cases of belly pain can be watched and treated at home. Sometimes, though, belly pain is serious. Your doctor will try to find the cause of your belly pain. Follow these instructions at home:  Medicines Take over-the-counter and prescription medicines only as told by your doctor. Do not take medicines that help you poop (laxatives) unless told by your doctor. General instructions Watch your belly pain for any changes. Drink enough fluid to keep your pee (urine) pale yellow. Keep all follow-up visits as told by your doctor. This is important. Contact a doctor if: Your belly pain changes or gets worse. You are not hungry, or you lose weight without trying. You are having trouble pooping (constipated) or have watery poop (diarrhea) for more than 2-3 days. You have pain when you pee or poop. Your belly pain wakes you up at night. Your pain gets worse with meals, after eating, or with certain foods. You are vomiting and cannot keep anything down. You have a fever. You have blood in your pee. Get help right away if: Your pain does not go away as soon as your doctor says it should. You cannot stop vomiting. Your pain is only in areas of your belly, such as the right side or the left lower part of the belly. You have bloody or black poop, or poop that looks like tar. You have very bad pain, cramping, or bloating in your belly. You have signs of not having enough fluid or water in your body (dehydration), such as: Dark pee, very little pee, or no pee. Cracked lips. Dry mouth. Sunken eyes. Sleepiness. Weakness. You have trouble breathing or chest pain. Summary Many cases of belly pain can be watched and treated at home. Watch your belly pain for any changes. Take over-the-counter and prescription medicines only as told by your doctor. Contact a doctor if your belly pain  changes or gets worse. Get help right away if you have very bad pain, cramping, or bloating in your belly. This information is not intended to replace advice given to you by your health care provider. Make sure you discuss any questions you have with your health care provider. Document Revised: 03/06/2019 Document Reviewed: 03/06/2019 Elsevier Patient Education  [place].

## 2022-08-04 NOTE — Progress Notes (Signed)
Subjective:    Patient ID: Gretchen Short, female    DOB: 06/16/1971, 51 y.o.   MRN: [id number]  HPI  Patient presents to clinic today for TCM hospital follow-up.  She presented to the ER 9/17 with complaint of upper abdominal pain that radiated into her chest associated with shortness of breath.  Labs revealed elevated liver enzymes however she has a known history of fatty liver.  CT abdomen/pelvis showed gallbladder wall thickening.  She subsequently underwent a HIDA scan which was negative.  GI was consulted.  She underwent upper endoscopy where they found a nonbleeding esophageal ulcer.  It was recommended that she increase her PPI to 2 times daily and follow-up with GI as an outpatient.  She was urged on 9/19.  Since that time, she has has some intermittent abdominal pain. She reports the pain is more an uncomfortable feeling. She has frequent nausea and poor appetite. She has lost 75 lbs in 1 year. She reports she has no desire to eat. She has a follow up with GI scheduled 08/25/22. She would like a referral for a second opinion.  Review of Systems     Past Medical History:  Diagnosis Date   Anxiety    Depression    GERD (gastroesophageal reflux disease)    Hypertension    Migraine     Current Outpatient Medications  Medication Sig Dispense Refill   escitalopram (LEXAPRO) 20 MG tablet Take 20 mg by mouth daily.     folic acid (FOLVITE) 1 MG tablet Take 1 tablet (1 mg total) by mouth daily.     metFORMIN (GLUCOPHAGE) 500 MG tablet TAKE 1 TABLET(500 MG) BY MOUTH TWICE DAILY WITH A MEAL 60 tablet 2   metoCLOPramide (REGLAN) 10 MG tablet Take 1 tablet (10 mg total) by mouth every 8 (eight) hours as needed for nausea. 30 tablet 0   Multiple Vitamin (MULTIVITAMIN) tablet Take 1 tablet by mouth daily.     omeprazole (PRILOSEC) 40 MG capsule Take 1 capsule (40 mg total) by mouth 2 (two) times daily before a meal. 60 capsule 3   ondansetron (ZOFRAN-ODT) 4 MG disintegrating tablet Take 1  tablet (4 mg total) by mouth every 8 (eight) hours as needed for nausea or vomiting. 30 tablet 0   No current facility-administered medications for this visit.    No Known Allergies  Family History  Problem Relation Age of Onset   Healthy Mother    Prostate cancer Father     Social History   Socioeconomic History   Marital status: Single    Spouse name: Not on file   Number of children: Not on file   Years of education: Not on file   Highest education level: Not on file  Occupational History   Not on file  Tobacco Use   Smoking status: Never   Smokeless tobacco: Never  Vaping Use   Vaping Use: Never used  Substance and Sexual Activity   Alcohol use: Yes    Alcohol/week: 6.0 standard drinks of alcohol    Types: 6 Standard drinks or equivalent per week    Comment: 1-2 drinks per day   Drug use: Never   Sexual activity: Not on file  Other Topics Concern   Not on file  Social History Narrative   Not on file   Social Determinants of Health   Financial Resource Strain: Not on file  Food Insecurity: No Food Insecurity (07/26/2022)   Hunger Vital Sign    Worried About  Running Out of Food in the Last Year: Never true    [place] in the Last Year: Never true  Transportation Needs: No Transportation Needs (07/26/2022)   PRAPARE - Hydrologist (Medical): No    Lack of Transportation (Non-Medical): No  Physical Activity: Not on file  Stress: Not on file  Social Connections: Not on file  Intimate Partner Violence: Not At Risk (07/26/2022)   Humiliation, Afraid, Rape, and Kick questionnaire    Fear of Current or Ex-Partner: No    Emotionally Abused: No    Physically Abused: No    Sexually Abused: No     Constitutional: Denies fever, malaise, fatigue, headache or abrupt weight changes.  Respiratory: Denies difficulty breathing, shortness of breath, cough or sputum production.   Cardiovascular: Denies chest pain, chest tightness,  palpitations or swelling in the hands or feet.  Gastrointestinal: Pt reports abdominal pain and nausea. Denies bloating, constipation, diarrhea or blood in the stool.  GU: Denies urgency, frequency, pain with urination, burning sensation, blood in urine, odor or discharge. Musculoskeletal: Pt reports generalized weakness. Denies decrease in range of motion, difficulty with gait, muscle pain or joint pain and swelling.  Psych: Pt reports depression. Denies anxiety, SI/HI.  No other specific complaints in a complete review of systems (except as listed in HPI above).  Objective:   Physical Exam  BP 116/68 (BP Location: Right Arm, Patient Position: Sitting, Cuff Size: Normal)   Pulse (!) 117   Temp (!) 97.3 F (36.3 C) (Temporal)   Wt 154 lb (69.9 kg)   SpO2 99%   BMI 26.43 kg/m   Wt Readings from Last 3 Encounters:  07/28/22 155 lb (70.3 kg)  05/22/22 176 lb (79.8 kg)  04/29/22 182 lb (82.6 kg)    General: Appears her stated age, overweight,  in NAD. Cardiovascular: Tachycardic with normal rhythm. S1,S2 noted.  No murmur, rubs or gallops noted.  Pulmonary/Chest: Normal effort and positive vesicular breath sounds. No respiratory distress. No wheezes, rales or ronchi noted.  Abdomen: Soft and nontender. Normal bowel sounds. No distention or masses noted.  Musculoskeletal: Gait slow, slightly unsteady without device. Neurological: Alert and oriented.  Psychiatric: Mood and affect flat. Behavior is normal. Judgment and thought content normal.    BMET    Component Value Date/Time   NA 135 07/26/2022 1545   K 3.5 07/26/2022 1545   CL 98 07/26/2022 1545   CO2 23 07/26/2022 1545   GLUCOSE 97 07/26/2022 1545   BUN 9 07/26/2022 1545   CREATININE 0.69 07/26/2022 1545   CREATININE 0.76 12/23/2021 1332   CALCIUM 8.8 (L) 07/26/2022 1545   GFRNONAA >60 07/26/2022 1545    Lipid Panel  No results found for: "CHOL", "TRIG", "HDL", "CHOLHDL", "VLDL", "LDLCALC"  CBC    Component Value  Date/Time   WBC 7.5 07/26/2022 1545   RBC 3.95 07/26/2022 1545   HGB 13.0 07/26/2022 1545   HCT 39.3 07/26/2022 1545   PLT 251 07/26/2022 1545   MCV 99.5 07/26/2022 1545   MCH 32.9 07/26/2022 1545   MCHC 33.1 07/26/2022 1545   RDW 13.3 07/26/2022 1545    Hgb A1C Lab Results  Component Value Date   HGBA1C 4.7 12/23/2021           Assessment & Plan:   [facility] Follow Up for Gastric Ulcer, NAFLD, Chronic Nausea, Chronic Abdominal Pain:  Hospital notes, labs and imaging reviewed She will continue current meds at this time  Referral placed to GI at [facility] for second opinion Rx for Mirtazapine 7.5 mg for appetite stimulation Rx for Tramadol 50 mg daily as needed as needed for severe abdominal pain  RTC in 1 month for follow-up of chronic conditions Webb Silversmith, NP

## 2022-08-06 ENCOUNTER — Ambulatory Visit: Admit: 2022-08-06 | Payer: BC Managed Care – PPO | Admitting: Gastroenterology

## 2022-08-06 SURGERY — ESOPHAGOGASTRODUODENOSCOPY (EGD) WITH PROPOFOL
Anesthesia: General

## 2022-08-09 ENCOUNTER — Emergency Department: Payer: BC Managed Care – PPO

## 2022-08-09 ENCOUNTER — Emergency Department
Admission: EM | Admit: 2022-08-09 | Discharge: 2022-08-09 | Disposition: A | Discharge status: Home / Self Care | Payer: BC Managed Care – PPO | Attending: Emergency Medicine | Admitting: Emergency Medicine

## 2022-08-09 ENCOUNTER — Encounter: Payer: Self-pay | Admitting: Emergency Medicine

## 2022-08-09 ENCOUNTER — Other Ambulatory Visit: Payer: Self-pay

## 2022-08-09 DIAGNOSIS — Z79899 Other long term (current) drug therapy: Secondary | ICD-10-CM | POA: Insufficient documentation

## 2022-08-09 DIAGNOSIS — I10 Essential (primary) hypertension: Secondary | ICD-10-CM | POA: Diagnosis not present

## 2022-08-09 DIAGNOSIS — S42201A Unspecified fracture of upper end of right humerus, initial encounter for closed fracture: Secondary | ICD-10-CM | POA: Insufficient documentation

## 2022-08-09 DIAGNOSIS — S42211A Unspecified displaced fracture of surgical neck of right humerus, initial encounter for closed fracture: Secondary | ICD-10-CM | POA: Diagnosis not present

## 2022-08-09 DIAGNOSIS — S42351A Displaced comminuted fracture of shaft of humerus, right arm, initial encounter for closed fracture: Secondary | ICD-10-CM | POA: Diagnosis not present

## 2022-08-09 DIAGNOSIS — S42301A Unspecified fracture of shaft of humerus, right arm, initial encounter for closed fracture: Secondary | ICD-10-CM | POA: Diagnosis not present

## 2022-08-09 DIAGNOSIS — W109XXA Fall (on) (from) unspecified stairs and steps, initial encounter: Secondary | ICD-10-CM | POA: Diagnosis not present

## 2022-08-09 HISTORY — DX: Muscle wasting and atrophy, not elsewhere classified, other site: M62.58

## 2022-08-09 MED ORDER — OXYCODONE-ACETAMINOPHEN 5-325 MG PO TABS: 1.0000 | ORAL_TABLET | Freq: Four times a day (QID) | ORAL | 0 refills | Status: AC | PRN

## 2022-08-09 MED ORDER — OXYCODONE-ACETAMINOPHEN 5-325 MG PO TABS
1.0000 | ORAL_TABLET | Freq: Once | ORAL | Status: AC
  Administered 2022-08-09: 1 via ORAL
  Filled 2022-08-09: qty 1

## 2022-08-09 NOTE — Discharge Instructions (Signed)
You have been seen today in the emergency room and found to have a displaced fracture of your humerus.  You have been placed in a shoulder immobilizer that you will need to wear at all times except for dressing/showering.  You will need to contact the provided number to schedule an appointment with orthopedics first thing tomorrow morning.  I have also given you a prescription for Percocet to take for pain until you can see orthopedics.

## 2022-08-09 NOTE — ED Provider Notes (Signed)
[facility] Emergency Department Provider Note   ____________________________________________   Event Date/Time   First MD Initiated Contact with Patient 08/09/22 1457     (approximate)  I have reviewed the triage vital signs and the nursing notes.   HISTORY  Chief Complaint Fall and Shoulder Pain    HPI Monica Leon is a 51 y.o. female presents to the emergency room after a fall. Patient reports that she has a liver disease which is causing some muscle atrophy.  Because of this she has difficulty walking up one of the stairs at her home.  Therefore, when she goes to walk up this 1 particular stair she will crawl up.  Today, she had crawled up the stairs and went to stand up and as she was standing she fell down onto the stair hitting her right shoulder.  Since hitting her shoulder, she has been unable to use the right arm. Patient has abnormality noted to the right shoulder.  She has no pain when lifting the arm.  However, she has 10 out of 10 pain when lowering her arm.  There is some redness to the right shoulder region but no bruising.  There is no edema to the shoulder at this time.  She has not taken anything for the pain at this time.   Past Medical History:  Diagnosis Date   Anxiety    Depression    GERD (gastroesophageal reflux disease)    Hypertension    Migraine    Muscle atrophy of lower extremity     Patient Active Problem List   Diagnosis Date Noted   Hepatic steatosis 07/26/2022   History of gastric bypass 07/26/2022   History of esophageal stricture 07/26/2022   HTN (hypertension) 08/18/2021   Anxiety and depression 08/18/2021   GERD (gastroesophageal reflux disease) 08/18/2021    Past Surgical History:  Procedure Laterality Date   COLONOSCOPY WITH PROPOFOL N/A 03/23/2022   Procedure: COLONOSCOPY WITH PROPOFOL;  Surgeon: Lin Landsman, MD;  Location: [facility];  Service: Gastroenterology;  Laterality: N/A;    ESOPHAGOGASTRODUODENOSCOPY N/A 03/23/2022   Procedure: ESOPHAGOGASTRODUODENOSCOPY (EGD);  Surgeon: Lin Landsman, MD;  Location: [facility] ENDOSCOPY;  Service: Gastroenterology;  Laterality: N/A;   ESOPHAGOGASTRODUODENOSCOPY (EGD) WITH PROPOFOL N/A 04/29/2022   Procedure: ESOPHAGOGASTRODUODENOSCOPY (EGD) WITH PROPOFOL;  Surgeon: Lin Landsman, MD;  Location: [facility] ENDOSCOPY;  Service: Gastroenterology;  Laterality: N/A;   ESOPHAGOGASTRODUODENOSCOPY (EGD) WITH PROPOFOL N/A 07/28/2022   Procedure: ESOPHAGOGASTRODUODENOSCOPY (EGD) WITH PROPOFOL;  Surgeon: Lin Landsman, MD;  Location: [facility] ENDOSCOPY;  Service: Gastroenterology;  Laterality: N/A;   GASTRIC BYPASS  2008    Prior to Admission medications   Medication Sig Start Date End Date Taking? Authorizing Provider  oxyCODONE-acetaminophen (PERCOCET) 5-325 MG tablet Take 1 tablet by mouth every 6 (six) hours as needed for up to 5 days for severe pain. 08/09/22 08/14/22 Yes Willaim Rayas, NP  escitalopram (LEXAPRO) 20 MG tablet Take 20 mg by mouth daily. 07/30/21   [provider]  folic acid (FOLVITE) 1 MG tablet Take 1 tablet (1 mg total) by mouth daily. 07/28/22   Enzo Bi, MD  metFORMIN (GLUCOPHAGE) 500 MG tablet TAKE 1 TABLET(500 MG) BY MOUTH TWICE DAILY WITH A MEAL 06/22/22   Vanga, Tally Due, MD  metoCLOPramide (REGLAN) 10 MG tablet Take 1 tablet (10 mg total) by mouth every 8 (eight) hours as needed for nausea. 07/22/22 07/22/23  Lin Landsman, MD  mirtazapine (REMERON) 7.5 MG tablet Take 1  tablet (7.5 mg total) by mouth at bedtime. 08/04/22   Jearld Fenton, NP  Multiple Vitamin (MULTIVITAMIN) tablet Take 1 tablet by mouth daily.    [provider]  omeprazole (PRILOSEC) 40 MG capsule Take 1 capsule (40 mg total) by mouth 2 (two) times daily before a meal. 03/23/22 07/26/22  Vanga, Tally Due, MD  ondansetron (ZOFRAN-ODT) 4 MG disintegrating tablet Take 1 tablet (4 mg total) by mouth every 8 (eight) hours as  needed for nausea or vomiting. 07/22/22   Vanga, Tally Due, MD  traMADol (ULTRAM) 50 MG tablet Take 1 tablet (50 mg total) by mouth daily as needed. 08/04/22 09/03/22  Jearld Fenton, NP    Allergies Patient has no known allergies.  Family History  Problem Relation Age of Onset   Healthy Mother    Prostate cancer Father     Social History Social History   Tobacco Use   Smoking status: Never   Smokeless tobacco: Never  Vaping Use   Vaping Use: Never used  Substance Use Topics   Alcohol use: Yes    Alcohol/week: 6.0 standard drinks of alcohol    Types: 6 Standard drinks or equivalent per week    Comment: 1-2 drinks per day   Drug use: Never    Review of Systems  Constitutional: No fever/chills Eyes: No visual changes. ENT: No sore throat. Cardiovascular: Denies chest pain. Respiratory: Denies shortness of breath. Gastrointestinal: No abdominal pain.  No nausea, no vomiting.  No diarrhea.  No constipation. Genitourinary: Negative for dysuria. Musculoskeletal: Positive for right shoulder pain Skin: Negative for rash. Neurological: Negative for headaches, focal weakness or numbness.   ____________________________________________   PHYSICAL EXAM:  VITAL SIGNS: ED Triage Vitals  Enc Vitals Group     BP 08/09/22 1426 (!) 126/90     Pulse Rate 08/09/22 1426 (!) 107     Resp 08/09/22 1426 20     Temp 08/09/22 1426 97.8 F (36.6 C)     Temp Source 08/09/22 1426 Oral     SpO2 08/09/22 1426 97 %     Weight 08/09/22 1419 154 lb 5.2 oz (70 kg)     Height 08/09/22 1419 5' 4"  (1.626 m)     Head Circumference --      Peak Flow --      Pain Score 08/09/22 1419 10     Pain Loc --      Pain Edu? --      Excl. in [place]? --     Constitutional: Alert and oriented. Well appearing and in no acute distress. Eyes: Conjunctivae are normal. PERRL. EOMI. Head: Atraumatic. Nose: No congestion/rhinnorhea. Mouth/Throat: Mucous membranes are moist.  Oropharynx  non-erythematous. Neck: No stridor.   Cardiovascular: Normal rate, regular rhythm. Grossly normal heart sounds.  Good peripheral circulation. Respiratory: Normal respiratory effort.  No retractions. Lungs CTAB. Gastrointestinal: Soft and nontender. No distention. No abdominal bruits. No CVA tenderness. Musculoskeletal: Patient has no pain when lifting her right arm.  However she has 10 out of 10 pain when lowering the right arm.  The pain is located in her right shoulder.  Patient is unable to use her arm independently.  There is no bruising/swelling noted to the shoulder at this time.  However there is abnormality noted when compared to the left. Neurologic:  Normal speech and language. No gross focal neurologic deficits are appreciated. No gait instability. Skin:  Skin is warm, dry and intact. No rash noted. Psychiatric: Mood and affect are normal.  Speech and behavior are normal.  ____________________________________________   LABS (all labs ordered are listed, but only abnormal results are displayed)  Labs Reviewed - No data to display ____________________________________________  EKG   ____________________________________________  RADIOLOGY  ED MD interpretation:  X-ray of right shoulder reviewed by me and read by radiologist.  Official radiology report(s): DG Shoulder Right  Result Date: 08/09/2022 CLINICAL DATA:  Fall, pain. EXAM: RIGHT SHOULDER - 2+ VIEW COMPARISON:  None Available. FINDINGS: Comminuted and mildly displaced proximal humeral fracture. Fracture involves the metaphysis and surgical neck. There is no involvement of the glenohumeral joint. The glenohumeral joint remains congruent. The acromioclavicular joint is congruent. No additional fracture. Included right ribs are intact. IMPRESSION: Comminuted and mildly displaced proximal humeral fracture involving the metaphysis and surgical neck. No involvement of the glenohumeral joint. Electronically Signed   By: Keith Rake M.D.   On: 08/09/2022 15:03    ____________________________________________   PROCEDURES  Procedure(s) performed: None  Procedures  Critical Care performed: No  ____________________________________________   INITIAL IMPRESSION / ASSESSMENT AND PLAN / ED COURSE     Monica Leon is a 51 y.o. female presents to the emergency room after a fall. Patient reports that she has a liver disease which is causing some muscle atrophy.  Because of this she has difficulty walking up one of the stairs at her home.  Therefore, when she goes to walk up this 1 particular stair she will crawl up.  Today, she had crawled up the stairs and went to stand up and as she was standing she fell down onto the stair hitting her right shoulder.  Since hitting her shoulder, she has been unable to use the right arm. Patient has abnormality noted to the right shoulder.  She has no pain when lifting the arm.  However, she has 10 out of 10 pain when lowering her arm.  There is some redness to the right shoulder region but no bruising.  There is no edema to the shoulder at this time.  She has not taken anything for the pain at this time.  X-ray of right shoulder is obtained. Will give patient Percocet for pain at this time.  X-ray of right shoulder shows Comminuted and mildly displaced proximal humeral fracture involving the metaphysis and surgical neck with no involvement of the glenohumeral joint I have discussed findings of x-ray with patient. Today we will apply shoulder/arm sling/immobilizer and have patient to follow-up with orthopedics on Monday. I will prescribe her short course of Percocet to take for pain until she can follow-up with orthopedics.  Patient will be discharged home in stable condition at this time.      ____________________________________________   FINAL CLINICAL IMPRESSION(S) / ED DIAGNOSES  Final diagnoses:  Closed fracture of proximal end of right humerus, unspecified  fracture morphology, initial encounter     ED Discharge Orders          Ordered    oxyCODONE-acetaminophen (PERCOCET) 5-325 MG tablet  Every 6 hours PRN        08/09/22 1538             Note:  This document was prepared using Dragon voice recognition software and may include unintentional dictation errors.     Willaim Rayas, NP 08/09/22 1547    Duffy Bruce, MD 08/09/22 2228

## 2022-08-09 NOTE — ED Triage Notes (Signed)
Pt reports has muscle atrophy and was crawling up stairs and fell hurting her right arm. Denies head injuries or LOC.

## 2022-08-10 ENCOUNTER — Telehealth: Payer: Self-pay

## 2022-08-10 ENCOUNTER — Ambulatory Visit: Payer: Self-pay

## 2022-08-10 NOTE — Telephone Encounter (Signed)
Transition Care Management Follow-up Telephone Call Date of discharge and from where: 08/09/22 from [facility] ER How have you been since you were released from the hospital?  Trying to get strength back Any questions or concerns? No  Items Reviewed: Did the pt receive and understand the discharge instructions provided? Yes  Medications obtained and verified? Yes  Other? No  Any new allergies since your discharge? No  Dietary orders reviewed? No Do you have support at home? Yes   Home Care and Equipment/Supplies: Were home health services ordered? not applicable If so, what is the name of the agency? Has the agency set up a time to come to the patient's home? not applicable Were any new equipment or medical supplies ordered?  No What is the name of the medical supply agency?  Were you able to get the supplies/equipment? not applicable Do you have any questions related to the use of the equipment or supplies? No  Functional Questionnaire: (I = Independent and D = Dependent) ADLs: I  Bathing/Dressing- I  Meal Prep- I  Eating- I  Maintaining continence- I  Transferring/Ambulation- I  Managing Meds- I  Follow up appointments reviewed:  PCP Hospital f/u appt confirmed? No , Patient will call back to make appointment after Ortho Appt or if FMLA paperwork is needed.  [facility] f/u appt confirmed? No  Scheduled to see [place] on 08/12/22 @ 10:00. Are transportation arrangements needed? No  If their condition worsens, is the pt aware to call PCP or go to the Emergency Dept.? Yes Was the patient provided with contact information for the PCP's office or ED? Yes Was to pt encouraged to call back with questions or concerns? Yes

## 2022-08-10 NOTE — Telephone Encounter (Signed)
[place]-  Lost weight - no muscle mass- cant walk up stairs fell off stairs on right 2 spots. Fatigue  Chief Complaint: needing ST disablitlty, ongoing weakness, fell broke shoulder  Symptoms: 75 lb weight loss, moderated fatigue, muscle wasting, stated is eating better but medication rx makes her sleepy, husband has to assist with ADL's, stomach pain Frequency: less than a year Pertinent Negatives: Patient denies chest pain, fever, cough, SOB,  Disposition: [] ED /[] Urgent Care (no appt availability in office) / [] Appointment(In office/virtual)/ []  [facility]/ [] Home Care/ [] Refused Recommended Disposition /[] [facility]/ [x]  Follow-up with PCP Additional Notes: Pt given fax, phone number and address for practice. Pt stated will call [facility].  Reason for Disposition  [1] MODERATE weakness (i.e., interferes with work, school, normal activities) AND [2] persists > 3 days  Answer Assessment - Initial Assessment Questions 1. DESCRIPTION: "Describe how you are feeling."     fatigue 2. SEVERITY: "How bad is it?"  "Can you stand and walk?"   - MILD (0-3): Feels weak or tired, but does not interfere with work, school or normal activities.   - MODERATE (4-7): Able to stand and walk; weakness interferes with work, school, or normal activities.   - SEVERE (8-10): Unable to stand or walk; unable to do usual activities.     moderate 3. ONSET: "When did these symptoms begin?" (e.g., hours, days, weeks, months)     Lost 75 less than year and getting worse 4. CAUSE: "What do you think is causing the weakness or fatigue?" (e.g., not drinking enough fluids, medical problem, trouble sleeping)     Almost a year 5. NEW MEDICINES:  "Have you started on any new medicines recently?" (e.g., opioid pain medicines, benzodiazepines, muscle relaxants, antidepressants, antihistamines, neuroleptics, beta blockers)     To eat- makes sleepy 6. OTHER SYMPTOMS: "Do you have any other symptoms?" (e.g.,  chest pain, fever, cough, SOB, vomiting, diarrhea, bleeding, other areas of pain)     Weight loss, weakness, fatigue, stomach pain 7. PREGNANCY: "Is there any chance you are pregnant?" "When was your last menstrual period?"     N/a  Protocols used: Weakness (Generalized) and Fatigue-A-AH

## 2022-08-11 NOTE — Telephone Encounter (Signed)
She needs to schedule an appt with me for hospital follow up and so that we can discuss filling out her forms.

## 2022-08-11 NOTE — Telephone Encounter (Signed)
FYI

## 2022-08-12 ENCOUNTER — Other Ambulatory Visit: Payer: Self-pay | Admitting: Surgery

## 2022-08-12 DIAGNOSIS — S42221A 2-part displaced fracture of surgical neck of right humerus, initial encounter for closed fracture: Secondary | ICD-10-CM | POA: Diagnosis not present

## 2022-08-12 DIAGNOSIS — G8929 Other chronic pain: Secondary | ICD-10-CM | POA: Diagnosis not present

## 2022-08-12 DIAGNOSIS — M25511 Pain in right shoulder: Secondary | ICD-10-CM | POA: Diagnosis not present

## 2022-08-12 NOTE — Telephone Encounter (Signed)
LMTCB 08/12/2022.  PEC please schedule pt when she calls back.   Thanks,   -Mickel Baas

## 2022-08-17 ENCOUNTER — Encounter
Admission: RE | Admit: 2022-08-17 | Discharge: 2022-08-17 | Disposition: A | Discharge status: Home / Self Care | Payer: BC Managed Care – PPO | Source: Ambulatory Visit | Attending: Surgery | Admitting: Surgery

## 2022-08-17 ENCOUNTER — Other Ambulatory Visit: Payer: Self-pay | Admitting: Physician Assistant

## 2022-08-17 ENCOUNTER — Ambulatory Visit
Admission: RE | Admit: 2022-08-17 | Discharge: 2022-08-17 | Disposition: A | Discharge status: Home / Self Care | Payer: BC Managed Care – PPO | Source: Ambulatory Visit | Attending: Physician Assistant | Admitting: Physician Assistant

## 2022-08-17 VITALS — Ht 64.0 in | Wt 147.7 lb

## 2022-08-17 DIAGNOSIS — S42221A 2-part displaced fracture of surgical neck of right humerus, initial encounter for closed fracture: Secondary | ICD-10-CM

## 2022-08-17 DIAGNOSIS — S42351A Displaced comminuted fracture of shaft of humerus, right arm, initial encounter for closed fracture: Secondary | ICD-10-CM | POA: Diagnosis not present

## 2022-08-17 DIAGNOSIS — S42211A Unspecified displaced fracture of surgical neck of right humerus, initial encounter for closed fracture: Secondary | ICD-10-CM | POA: Diagnosis not present

## 2022-08-17 DIAGNOSIS — M25511 Pain in right shoulder: Secondary | ICD-10-CM

## 2022-08-17 DIAGNOSIS — R937 Abnormal findings on diagnostic imaging of other parts of musculoskeletal system: Secondary | ICD-10-CM | POA: Diagnosis not present

## 2022-08-17 DIAGNOSIS — S42331A Displaced oblique fracture of shaft of humerus, right arm, initial encounter for closed fracture: Secondary | ICD-10-CM | POA: Diagnosis not present

## 2022-08-17 DIAGNOSIS — S42201A Unspecified fracture of upper end of right humerus, initial encounter for closed fracture: Secondary | ICD-10-CM | POA: Diagnosis not present

## 2022-08-17 DIAGNOSIS — Z01818 Encounter for other preprocedural examination: Secondary | ICD-10-CM

## 2022-08-17 DIAGNOSIS — I10 Essential (primary) hypertension: Secondary | ICD-10-CM

## 2022-08-17 DIAGNOSIS — R9431 Abnormal electrocardiogram [ECG] [EKG]: Secondary | ICD-10-CM

## 2022-08-17 HISTORY — DX: Other specified postprocedural states: Z98.890

## 2022-08-17 HISTORY — DX: Fatty (change of) liver, not elsewhere classified: K76.0

## 2022-08-17 HISTORY — DX: Nausea with vomiting, unspecified: R11.2

## 2022-08-17 NOTE — Patient Instructions (Addendum)
Your procedure is scheduled on:08-20-22 Thursday Report to the Registration Desk on the 1st floor of the [facility].Then proceed to the 2nd floor Surgery Desk To find out your arrival time, please call [phone] between 1PM - 3PM on:08-19-22 Wednesday If your arrival time is 6:00 am, do not arrive prior to that time as the [facility] entrance doors do not open until 6:00 am.  REMEMBER: Instructions that are not followed completely may result in serious medical risk, up to and including death; or upon the discretion of your surgeon and anesthesiologist your surgery may need to be rescheduled.  Do not eat food after midnight the night before surgery.  No gum chewing, lozengers or hard candies.  You may however, drink CLEAR liquids up to 2 hours before you are scheduled to arrive for your surgery. Do not drink anything within 2 hours of your scheduled arrival time.  Clear liquids include: - water  - apple juice without pulp - gatorade (not RED colors) - black coffee or tea (Do NOT add milk or creamers to the coffee or tea) Do NOT drink anything that is not on this list.  In addition, your doctor has ordered for you to drink the provided  Ensure Pre-Surgery Clear Carbohydrate Drink  Drinking this carbohydrate drink up to two hours before surgery helps to reduce insulin resistance and improve patient outcomes. Please complete drinking 2 hours prior to scheduled arrival time.  TAKE THESE MEDICATIONS THE MORNING OF SURGERY WITH A SIP OF WATER: -escitalopram (LEXAPRO) -omeprazole (PRILOSEC)  -You may take metoCLOPramide (REGLAN) or ondansetron (ZOFRAN-ODT) for nausea if needed  Stop your metFORMIN (GLUCOPHAGE) 2 days prior to surgery-Last dose 08-17-22 Monday  One week prior to surgery: Stop Anti-inflammatories (NSAIDS) such as Advil, Aleve, Ibuprofen, Motrin, Naproxen, Naprosyn and Aspirin based products such as Excedrin, Goodys Powder, BC Powder.You may however, take  Tylenol/Tramadol if needed for pain up until the day of surgery.  Stop ANY OVER THE COUNTER supplements/vitamins NOW (08-17-22) until after surgery (Multivitamin)  No Alcohol for 24 hours before or after surgery.  No Smoking including e-cigarettes for 24 hours prior to surgery.  No chewable tobacco products for at least 6 hours prior to surgery.  No nicotine patches on the day of surgery.  Do not use any "recreational" drugs for at least a week prior to your surgery.  Please be advised that the combination of cocaine and anesthesia may have negative outcomes, up to and including death. If you test positive for cocaine, your surgery will be cancelled.  On the morning of surgery brush your teeth with toothpaste and water, you may rinse your mouth with mouthwash if you wish. Do not swallow any toothpaste or mouthwash.  Use CHG wipes as directed on instruction sheet.  Do not wear jewelry, make-up, hairpins, clips or nail polish.  Do not wear lotions, powders, or perfumes.   Do not shave body from the neck down 48 hours prior to surgery just in case you cut yourself which could leave a site for infection.  Also, freshly shaved skin may become irritated if using the CHG soap.  Contact lenses, hearing aids and dentures may not be worn into surgery.  Do not bring valuables to the hospital. [facility] is not responsible for any missing/lost belongings or valuables.   Notify your doctor if there is any change in your medical condition (cold, fever, infection).  Wear comfortable clothing (specific to your surgery type) to the hospital.  After surgery, you can  help prevent lung complications by doing breathing exercises.  Take deep breaths and cough every 1-2 hours. Your doctor may order a device called an Incentive Spirometer to help you take deep breaths. When coughing or sneezing, hold a pillow firmly against your incision with both hands. This is called "splinting." Doing this helps  protect your incision. It also decreases belly discomfort.  If you are being admitted to the hospital overnight, leave your suitcase in the car. After surgery it may be brought to your room.  If you are being discharged the day of surgery, you will not be allowed to drive home. You will need a responsible adult (18 years or older) to drive you home and stay with you that night.   If you are taking public transportation, you will need to have a responsible adult (18 years or older) with you. Please confirm with your physician that it is acceptable to use public transportation.   Please call the [facility] at [phone] if you have any questions about these instructions.  Surgery Visitation Policy:  Patients undergoing a surgery or procedure may have two family members or support persons with them as long as the person is not COVID-19 positive or experiencing its symptoms.    How to Use an Incentive Spirometer An incentive spirometer is a tool that measures how well you are filling your lungs with each breath. Learning to take long, deep breaths using this tool can help you keep your lungs clear and active. This may help to reverse or lessen your chance of developing breathing (pulmonary) problems, especially infection. You may be asked to use a spirometer: After a surgery. If you have a lung problem or a history of smoking. After a long period of time when you have been unable to move or be active. If the spirometer includes an indicator to show the highest number that you have reached, your health care provider or respiratory therapist will help you set a goal. Keep a log of your progress as told by your health care provider. What are the risks? Breathing too quickly may cause dizziness or cause you to pass out. Take your time so you do not get dizzy or light-headed. If you are in pain, you may need to take pain medicine before doing incentive spirometry. It is harder  to take a deep breath if you are having pain. How to use your incentive spirometer  Sit up on the edge of your bed or on a chair. Hold the incentive spirometer so that it is in an upright position. Before you use the spirometer, breathe out normally. Place the mouthpiece in your mouth. Make sure your lips are closed tightly around it. Breathe in slowly and as deeply as you can through your mouth, causing the piston or the ball to rise toward the top of the chamber. Hold your breath for 3-5 seconds, or for as long as possible. If the spirometer includes a coach indicator, use this to guide you in breathing. Slow down your breathing if the indicator goes above the marked areas. Remove the mouthpiece from your mouth and breathe out normally. The piston or ball will return to the bottom of the chamber. Rest for a few seconds, then repeat the steps 10 or more times. Take your time and take a few normal breaths between deep breaths so that you do not get dizzy or light-headed. Do this every 1-2 hours when you are awake. If the spirometer includes a goal  marker to show the highest number you have reached (best effort), use this as a goal to work toward during each repetition. After each set of 10 deep breaths, cough a few times. This will help to make sure that your lungs are clear. If you have an incision on your chest or abdomen from surgery, place a pillow or a rolled-up towel firmly against the incision when you cough. This can help to reduce pain while taking deep breaths and coughing. General tips When you are able to get out of bed: Walk around often. Continue to take deep breaths and cough in order to clear your lungs. Keep using the incentive spirometer until your health care provider says it is okay to stop using it. If you have been in the hospital, you may be told to keep using the spirometer at home. Contact a health care provider if: You are having difficulty using the spirometer. You  have trouble using the spirometer as often as instructed. Your pain medicine is not giving enough relief for you to use the spirometer as told. You have a fever. Get help right away if: You develop shortness of breath. You develop a cough with bloody mucus from the lungs. You have fluid or blood coming from an incision site after you cough. Summary An incentive spirometer is a tool that can help you learn to take long, deep breaths to keep your lungs clear and active. You may be asked to use a spirometer after a surgery, if you have a lung problem or a history of smoking, or if you have been inactive for a long period of time. Use your incentive spirometer as instructed every 1-2 hours while you are awake. If you have an incision on your chest or abdomen, place a pillow or a rolled-up towel firmly against your incision when you cough. This will help to reduce pain. Get help right away if you have shortness of breath, you cough up bloody mucus, or blood comes from your incision when you cough. This information is not intended to replace advice given to you by your health care provider. Make sure you discuss any questions you have with your health care provider. Document Revised: 01/15/2020 Document Reviewed: 01/15/2020 Elsevier Patient Education  [place].

## 2022-08-18 ENCOUNTER — Encounter
Admission: RE | Admit: 2022-08-18 | Discharge: 2022-08-18 | Disposition: A | Discharge status: Home / Self Care | Payer: BC Managed Care – PPO | Source: Ambulatory Visit | Attending: Surgery | Admitting: Surgery

## 2022-08-18 ENCOUNTER — Other Ambulatory Visit: Payer: Self-pay

## 2022-08-18 DIAGNOSIS — K769 Liver disease, unspecified: Secondary | ICD-10-CM | POA: Diagnosis not present

## 2022-08-18 DIAGNOSIS — W109XXA Fall (on) (from) unspecified stairs and steps, initial encounter: Secondary | ICD-10-CM | POA: Diagnosis not present

## 2022-08-18 DIAGNOSIS — I10 Essential (primary) hypertension: Secondary | ICD-10-CM | POA: Insufficient documentation

## 2022-08-18 DIAGNOSIS — Z01818 Encounter for other preprocedural examination: Secondary | ICD-10-CM | POA: Insufficient documentation

## 2022-08-18 DIAGNOSIS — R9431 Abnormal electrocardiogram [ECG] [EKG]: Secondary | ICD-10-CM | POA: Insufficient documentation

## 2022-08-18 DIAGNOSIS — S42221A 2-part displaced fracture of surgical neck of right humerus, initial encounter for closed fracture: Secondary | ICD-10-CM | POA: Diagnosis not present

## 2022-08-18 DIAGNOSIS — K219 Gastro-esophageal reflux disease without esophagitis: Secondary | ICD-10-CM | POA: Diagnosis not present

## 2022-08-18 DIAGNOSIS — Z01810 Encounter for preprocedural cardiovascular examination: Secondary | ICD-10-CM | POA: Diagnosis not present

## 2022-08-18 LAB — TYPE AND SCREEN
ABO/RH(D): A NEG
Antibody Screen: NEGATIVE

## 2022-08-19 ENCOUNTER — Telehealth: Payer: Self-pay | Admitting: Gastroenterology

## 2022-08-19 NOTE — Telephone Encounter (Signed)
Medical records for 03/23/2022 and 04/29/2022 were sent to

## 2022-08-20 ENCOUNTER — Ambulatory Visit: Payer: BC Managed Care – PPO | Admitting: Urgent Care

## 2022-08-20 ENCOUNTER — Other Ambulatory Visit: Payer: Self-pay

## 2022-08-20 ENCOUNTER — Encounter
Admission: RE | Disposition: A | Discharge status: Home / Self Care | Payer: Self-pay | Source: Home / Self Care | Attending: Surgery

## 2022-08-20 ENCOUNTER — Ambulatory Visit: Payer: BC Managed Care – PPO

## 2022-08-20 ENCOUNTER — Ambulatory Visit
Admission: RE | Admit: 2022-08-20 | Discharge: 2022-08-20 | Disposition: A | Discharge status: Home / Self Care | Payer: BC Managed Care – PPO | Attending: Surgery | Admitting: Surgery

## 2022-08-20 ENCOUNTER — Ambulatory Visit: Payer: BC Managed Care – PPO | Admitting: Anesthesiology

## 2022-08-20 ENCOUNTER — Encounter: Payer: Self-pay | Admitting: Surgery

## 2022-08-20 DIAGNOSIS — G8918 Other acute postprocedural pain: Secondary | ICD-10-CM | POA: Diagnosis not present

## 2022-08-20 DIAGNOSIS — W109XXA Fall (on) (from) unspecified stairs and steps, initial encounter: Secondary | ICD-10-CM | POA: Insufficient documentation

## 2022-08-20 DIAGNOSIS — S42221A 2-part displaced fracture of surgical neck of right humerus, initial encounter for closed fracture: Secondary | ICD-10-CM | POA: Insufficient documentation

## 2022-08-20 DIAGNOSIS — I10 Essential (primary) hypertension: Secondary | ICD-10-CM | POA: Diagnosis not present

## 2022-08-20 DIAGNOSIS — F418 Other specified anxiety disorders: Secondary | ICD-10-CM | POA: Diagnosis not present

## 2022-08-20 DIAGNOSIS — K769 Liver disease, unspecified: Secondary | ICD-10-CM | POA: Diagnosis not present

## 2022-08-20 DIAGNOSIS — K219 Gastro-esophageal reflux disease without esophagitis: Secondary | ICD-10-CM | POA: Diagnosis not present

## 2022-08-20 DIAGNOSIS — M7521 Bicipital tendinitis, right shoulder: Secondary | ICD-10-CM | POA: Diagnosis not present

## 2022-08-20 DIAGNOSIS — S42201A Unspecified fracture of upper end of right humerus, initial encounter for closed fracture: Secondary | ICD-10-CM | POA: Diagnosis not present

## 2022-08-20 DIAGNOSIS — S42351A Displaced comminuted fracture of shaft of humerus, right arm, initial encounter for closed fracture: Secondary | ICD-10-CM | POA: Diagnosis not present

## 2022-08-20 HISTORY — PX: ORIF HUMERUS FRACTURE: SHX2126

## 2022-08-20 LAB — ABO/RH: ABO/RH(D): A NEG

## 2022-08-20 SURGERY — OPEN REDUCTION INTERNAL FIXATION (ORIF) PROXIMAL HUMERUS FRACTURE
Anesthesia: General | Site: Shoulder | Laterality: Right

## 2022-08-20 MED ORDER — PROPOFOL 500 MG/50ML IV EMUL
INTRAVENOUS | Status: DC | PRN
  Administered 2022-08-20: 90 ug/kg/min via INTRAVENOUS

## 2022-08-20 MED ORDER — BUPIVACAINE LIPOSOME 1.3 % IJ SUSP
INTRAMUSCULAR | Status: AC
  Filled 2022-08-20: qty 10

## 2022-08-20 MED ORDER — OXYCODONE HCL 5 MG PO TABS: 5.0000 mg | ORAL_TABLET | ORAL | 0 refills | Status: DC | PRN

## 2022-08-20 MED ORDER — BUPIVACAINE-EPINEPHRINE (PF) 0.5% -1:200000 IJ SOLN
INTRAMUSCULAR | Status: AC
  Filled 2022-08-20: qty 30

## 2022-08-20 MED ORDER — ONDANSETRON HCL 4 MG/2ML IJ SOLN
INTRAMUSCULAR | Status: AC
  Filled 2022-08-20: qty 2

## 2022-08-20 MED ORDER — FENTANYL CITRATE (PF) 100 MCG/2ML IJ SOLN
INTRAMUSCULAR | Status: DC | PRN
  Administered 2022-08-20 (×2): 50 ug via INTRAVENOUS

## 2022-08-20 MED ORDER — SUGAMMADEX SODIUM 200 MG/2ML IV SOLN
INTRAVENOUS | Status: DC | PRN
  Administered 2022-08-20: 200 mg via INTRAVENOUS

## 2022-08-20 MED ORDER — TRANEXAMIC ACID-NACL 1000-0.7 MG/100ML-% IV SOLN
INTRAVENOUS | Status: DC | PRN
  Administered 2022-08-20: 1000 mg via INTRAVENOUS

## 2022-08-20 MED ORDER — ROCURONIUM BROMIDE 10 MG/ML (PF) SYRINGE
PREFILLED_SYRINGE | INTRAVENOUS | Status: AC
  Filled 2022-08-20: qty 10

## 2022-08-20 MED ORDER — PHENYLEPHRINE 80 MCG/ML (10ML) SYRINGE FOR IV PUSH (FOR BLOOD PRESSURE SUPPORT)
PREFILLED_SYRINGE | INTRAVENOUS | Status: AC
  Filled 2022-08-20: qty 10

## 2022-08-20 MED ORDER — BUPIVACAINE LIPOSOME 1.3 % IJ SUSP
INTRAMUSCULAR | Status: DC | PRN
  Administered 2022-08-20: 13 mL via PERINEURAL
  Administered 2022-08-20: 7 mL via PERINEURAL

## 2022-08-20 MED ORDER — ROCURONIUM BROMIDE 100 MG/10ML IV SOLN
INTRAVENOUS | Status: DC | PRN
  Administered 2022-08-20: 20 mg via INTRAVENOUS
  Administered 2022-08-20: 50 mg via INTRAVENOUS

## 2022-08-20 MED ORDER — FENTANYL CITRATE PF 50 MCG/ML IJ SOSY: 50.0000 ug | PREFILLED_SYRINGE | Freq: Once | INTRAMUSCULAR | Status: AC

## 2022-08-20 MED ORDER — DEXAMETHASONE SODIUM PHOSPHATE 10 MG/ML IJ SOLN
INTRAMUSCULAR | Status: DC | PRN
  Administered 2022-08-20: 10 mg via INTRAVENOUS

## 2022-08-20 MED ORDER — FENTANYL CITRATE PF 50 MCG/ML IJ SOSY
PREFILLED_SYRINGE | INTRAMUSCULAR | Status: AC
  Administered 2022-08-20: 50 ug via INTRAVENOUS
  Filled 2022-08-20: qty 1

## 2022-08-20 MED ORDER — CHLORHEXIDINE GLUCONATE 0.12 % MT SOLN
OROMUCOSAL | Status: AC
  Administered 2022-08-20: 15 mL via OROMUCOSAL
  Filled 2022-08-20: qty 15

## 2022-08-20 MED ORDER — KETOROLAC TROMETHAMINE 15 MG/ML IJ SOLN
INTRAMUSCULAR | Status: AC
  Filled 2022-08-20: qty 1

## 2022-08-20 MED ORDER — LACTATED RINGERS IV SOLN: INTRAVENOUS | Status: DC

## 2022-08-20 MED ORDER — DEXAMETHASONE SODIUM PHOSPHATE 10 MG/ML IJ SOLN
INTRAMUSCULAR | Status: AC
  Filled 2022-08-20: qty 1

## 2022-08-20 MED ORDER — PHENYLEPHRINE HCL (PRESSORS) 10 MG/ML IV SOLN
INTRAVENOUS | Status: DC | PRN
  Administered 2022-08-20: 80 ug via INTRAVENOUS
  Administered 2022-08-20: 160 ug via INTRAVENOUS
  Administered 2022-08-20: 80 ug via INTRAVENOUS
  Administered 2022-08-20: 160 ug via INTRAVENOUS
  Administered 2022-08-20: 80 ug via INTRAVENOUS
  Administered 2022-08-20: 160 ug via INTRAVENOUS
  Administered 2022-08-20: 80 ug via INTRAVENOUS
  Administered 2022-08-20: 160 ug via INTRAVENOUS

## 2022-08-20 MED ORDER — FENTANYL CITRATE (PF) 100 MCG/2ML IJ SOLN: 25.0000 ug | INTRAMUSCULAR | Status: DC | PRN

## 2022-08-20 MED ORDER — MIDAZOLAM HCL 2 MG/2ML IJ SOLN
INTRAMUSCULAR | Status: DC | PRN
  Administered 2022-08-20: 2 mg via INTRAVENOUS

## 2022-08-20 MED ORDER — PROPOFOL 10 MG/ML IV BOLUS
INTRAVENOUS | Status: DC | PRN
  Administered 2022-08-20: 120 mg via INTRAVENOUS

## 2022-08-20 MED ORDER — EPHEDRINE SULFATE (PRESSORS) 50 MG/ML IJ SOLN
INTRAMUSCULAR | Status: DC | PRN
  Administered 2022-08-20: 5 mg via INTRAVENOUS

## 2022-08-20 MED ORDER — FAMOTIDINE 20 MG PO TABS
20.0000 mg | ORAL_TABLET | Freq: Once | ORAL | Status: AC
  Administered 2022-08-20: 20 mg via ORAL

## 2022-08-20 MED ORDER — SUCCINYLCHOLINE CHLORIDE 200 MG/10ML IV SOSY
PREFILLED_SYRINGE | INTRAVENOUS | Status: AC
  Filled 2022-08-20: qty 10

## 2022-08-20 MED ORDER — CEFAZOLIN SODIUM-DEXTROSE 2-4 GM/100ML-% IV SOLN
2.0000 g | INTRAVENOUS | Status: AC
  Administered 2022-08-20: 2 g via INTRAVENOUS

## 2022-08-20 MED ORDER — CEFAZOLIN SODIUM-DEXTROSE 2-4 GM/100ML-% IV SOLN
INTRAVENOUS | Status: AC
  Filled 2022-08-20: qty 100

## 2022-08-20 MED ORDER — DEXMEDETOMIDINE HCL IN NACL 200 MCG/50ML IV SOLN
INTRAVENOUS | Status: DC | PRN
  Administered 2022-08-20: 8 ug via INTRAVENOUS
  Administered 2022-08-20: 12 ug via INTRAVENOUS

## 2022-08-20 MED ORDER — FAMOTIDINE 20 MG PO TABS
ORAL_TABLET | ORAL | Status: AC
  Filled 2022-08-20: qty 1

## 2022-08-20 MED ORDER — KETOROLAC TROMETHAMINE 15 MG/ML IJ SOLN
15.0000 mg | Freq: Once | INTRAMUSCULAR | Status: AC
  Administered 2022-08-20: 15 mg via INTRAVENOUS

## 2022-08-20 MED ORDER — MIDAZOLAM HCL 2 MG/2ML IJ SOLN
INTRAMUSCULAR | Status: AC
  Administered 2022-08-20: 1 mg via INTRAVENOUS
  Filled 2022-08-20: qty 2

## 2022-08-20 MED ORDER — 0.9 % SODIUM CHLORIDE (POUR BTL) OPTIME
TOPICAL | Status: DC | PRN
  Administered 2022-08-20: 250 mL

## 2022-08-20 MED ORDER — PROPOFOL 1000 MG/100ML IV EMUL
INTRAVENOUS | Status: AC
  Filled 2022-08-20: qty 100

## 2022-08-20 MED ORDER — OXYCODONE HCL 5 MG/5ML PO SOLN: 5.0000 mg | Freq: Once | ORAL | Status: DC | PRN

## 2022-08-20 MED ORDER — MIDAZOLAM HCL 2 MG/2ML IJ SOLN
INTRAMUSCULAR | Status: AC
  Filled 2022-08-20: qty 2

## 2022-08-20 MED ORDER — OXYCODONE HCL 5 MG PO TABS: 5.0000 mg | ORAL_TABLET | Freq: Once | ORAL | Status: DC | PRN

## 2022-08-20 MED ORDER — TRANEXAMIC ACID 1000 MG/10ML IV SOLN
INTRAVENOUS | Status: AC
  Filled 2022-08-20: qty 10

## 2022-08-20 MED ORDER — MIDAZOLAM HCL 2 MG/2ML IJ SOLN: 1.0000 mg | Freq: Once | INTRAMUSCULAR | Status: AC

## 2022-08-20 MED ORDER — ESMOLOL HCL 100 MG/10ML IV SOLN
INTRAVENOUS | Status: DC | PRN
  Administered 2022-08-20 (×2): 10 mg via INTRAVENOUS

## 2022-08-20 MED ORDER — PROPOFOL 10 MG/ML IV BOLUS
INTRAVENOUS | Status: AC
  Filled 2022-08-20: qty 20

## 2022-08-20 MED ORDER — CHLORHEXIDINE GLUCONATE 0.12 % MT SOLN: 15.0000 mL | Freq: Once | OROMUCOSAL | Status: AC

## 2022-08-20 MED ORDER — BUPIVACAINE HCL (PF) 0.5 % IJ SOLN
INTRAMUSCULAR | Status: AC
  Filled 2022-08-20: qty 10

## 2022-08-20 MED ORDER — ORAL CARE MOUTH RINSE: 15.0000 mL | Freq: Once | OROMUCOSAL | Status: AC

## 2022-08-20 MED ORDER — BUPIVACAINE HCL (PF) 0.5 % IJ SOLN
INTRAMUSCULAR | Status: DC | PRN
  Administered 2022-08-20: 7 mL via PERINEURAL
  Administered 2022-08-20: 3 mL via PERINEURAL

## 2022-08-20 MED ORDER — FENTANYL CITRATE (PF) 100 MCG/2ML IJ SOLN
INTRAMUSCULAR | Status: AC
  Filled 2022-08-20: qty 2

## 2022-08-20 MED ORDER — BUPIVACAINE-EPINEPHRINE (PF) 0.5% -1:200000 IJ SOLN
INTRAMUSCULAR | Status: DC | PRN
  Administered 2022-08-20: 30 mL

## 2022-08-20 MED ORDER — LIDOCAINE HCL (CARDIAC) PF 100 MG/5ML IV SOSY
PREFILLED_SYRINGE | INTRAVENOUS | Status: DC | PRN
  Administered 2022-08-20: 60 mg via INTRAVENOUS

## 2022-08-20 SURGICAL SUPPLY — 79 items
APL PRP STRL LF DISP 70% ISPRP (MISCELLANEOUS) ×2
BIT DRILL 3.2 (BIT) ×1
BIT DRILL 3.2XCALB NS DISP (BIT) IMPLANT
BIT DRILL CALIBRATED 2.7 (BIT) IMPLANT
BIT DRL 3.2XCALB NS DISP (BIT) ×1
BNDG CMPR 5X4 CHSV STRCH STRL (GAUZE/BANDAGES/DRESSINGS) ×1
BNDG COHESIVE 4X5 TAN STRL LF (GAUZE/BANDAGES/DRESSINGS) ×1 IMPLANT
BNDG ELASTIC 4X5.8 VLCR STR LF (GAUZE/BANDAGES/DRESSINGS) ×2 IMPLANT
CHLORAPREP W/TINT 26 (MISCELLANEOUS) ×2 IMPLANT
DRAPE C-ARM XRAY 36X54 (DRAPES) ×1 IMPLANT
DRAPE INCISE IOBAN 66X45 STRL (DRAPES) ×2 IMPLANT
DRAPE SHEET LG 3/4 BI-LAMINATE (DRAPES) ×1 IMPLANT
DRAPE U-SHAPE 47X51 STRL (DRAPES) ×1 IMPLANT
DRSG OPSITE POSTOP 4X10 (GAUZE/BANDAGES/DRESSINGS) ×1 IMPLANT
DRSG OPSITE POSTOP 4X8 (GAUZE/BANDAGES/DRESSINGS) ×1 IMPLANT
ELECT CAUTERY BLADE 6.4 (BLADE) ×1 IMPLANT
ELECT REM PT RETURN 9FT ADLT (ELECTROSURGICAL) ×1
ELECTRODE REM PT RTRN 9FT ADLT (ELECTROSURGICAL) ×1 IMPLANT
GAUZE SPONGE 4X4 12PLY STRL (GAUZE/BANDAGES/DRESSINGS) ×1 IMPLANT
GAUZE XEROFORM 1X8 LF (GAUZE/BANDAGES/DRESSINGS) ×1 IMPLANT
GLOVE BIO SURGEON STRL SZ7.5 (GLOVE) ×2 IMPLANT
GLOVE BIO SURGEON STRL SZ8 (GLOVE) ×2 IMPLANT
GLOVE BIOGEL PI IND STRL 8 (GLOVE) ×1 IMPLANT
GLOVE SURG UNDER LTX SZ8 (GLOVE) ×1 IMPLANT
GOWN STRL REUS W/ TWL LRG LVL3 (GOWN DISPOSABLE) ×2 IMPLANT
GOWN STRL REUS W/ TWL XL LVL3 (GOWN DISPOSABLE) ×1 IMPLANT
GOWN STRL REUS W/TWL LRG LVL3 (GOWN DISPOSABLE) ×2
GOWN STRL REUS W/TWL XL LVL3 (GOWN DISPOSABLE) ×1
HANDLE YANKAUER SUCT BULB TIP (MISCELLANEOUS) ×1 IMPLANT
K-WIRE 2X5 SS THRDED S3 (WIRE) ×1
KIT STABILIZATION SHOULDER (MISCELLANEOUS) ×1 IMPLANT
KIT TURNOVER KIT A (KITS) ×1 IMPLANT
KWIRE 2X5 SS THRDED S3 (WIRE) IMPLANT
MANIFOLD NEPTUNE II (INSTRUMENTS) ×1 IMPLANT
MASK FACE SPIDER DISP (MASK) ×1 IMPLANT
MAT ABSORB  FLUID 56X50 GRAY (MISCELLANEOUS)
MAT ABSORB FLUID 56X50 GRAY (MISCELLANEOUS) ×1 IMPLANT
NDL HYPO 25X1 1.5 SAFETY (NEEDLE) ×1 IMPLANT
NDL MAYO 6 CRC TAPER PT (NEEDLE) ×3 IMPLANT
NEEDLE HYPO 25X1 1.5 SAFETY (NEEDLE) ×1 IMPLANT
NEEDLE MAYO 6 CRC TAPER PT (NEEDLE) IMPLANT
NS IRRIG 1000ML POUR BTL (IV SOLUTION) ×1 IMPLANT
PACK ARTHROSCOPY SHOULDER (MISCELLANEOUS) ×1 IMPLANT
PAD CAST 4YDX4 CTTN HI CHSV (CAST SUPPLIES) ×2 IMPLANT
PAD WRAPON POLAR SHDR UNIV (MISCELLANEOUS) IMPLANT
PADDING CAST COTTON 4X4 STRL (CAST SUPPLIES)
PEG LOCKING 3.2MMX44 (Peg) IMPLANT
PEG LOCKING 3.2MMX46 (Peg) IMPLANT
PEG LOCKING 3.2MMX65 (Peg) IMPLANT
PEG LOCKING 3.2X 60MM (Peg) IMPLANT
PLATE PROX HUM LO R 4H 83 (Plate) IMPLANT
PULSAVAC PLUS IRRIG FAN TIP (DISPOSABLE)
SCREW LOCK CORT STAR 3.5X24 (Screw) IMPLANT
SCREW LOCK CORT STAR 3.5X26 (Screw) IMPLANT
SCREW LOCK CORT STAR 3.5X28 (Screw) IMPLANT
SCREW LP NL T15 3.5X24 (Screw) IMPLANT
SCREW LP NL T15 3.5X26 (Screw) IMPLANT
SCREW PEG LOCK 3.2X30MM (Screw) IMPLANT
SCREW T15 MD 3.5X44MM NS (Screw) IMPLANT
SCREW T15 MD 3.5X50MM NS (Screw) IMPLANT
SCREW T15 MD 3.5X52MM NS (Screw) IMPLANT
SLEEVE MEASURING 3.2 (BIT) IMPLANT
SLING ULTRA II LG (MISCELLANEOUS) ×1 IMPLANT
SLING ULTRA II M (MISCELLANEOUS) IMPLANT
SPONGE T-LAP 18X18 ~~LOC~~+RFID (SPONGE) ×1 IMPLANT
STAPLER SKIN PROX 35W (STAPLE) ×1 IMPLANT
STOCKINETTE IMPERVIOUS 9X36 MD (GAUZE/BANDAGES/DRESSINGS) ×1 IMPLANT
SUT ETHIBOND 0 MO6 C/R (SUTURE) ×1 IMPLANT
SUT FIBERWIRE #2 38 BLUE 1/2 (SUTURE) ×3
SUT PROLENE 4 0 PS 2 18 (SUTURE) ×2 IMPLANT
SUT VIC AB 2-0 CT1 27 (SUTURE) ×2
SUT VIC AB 2-0 CT1 TAPERPNT 27 (SUTURE) ×4 IMPLANT
SUTURE FIBERWR #2 38 BLUE 1/2 (SUTURE) ×4 IMPLANT
SYR 10ML LL (SYRINGE) ×1 IMPLANT
Synthes wire IMPLANT
TIP FAN IRRIG PULSAVAC PLUS (DISPOSABLE) ×1 IMPLANT
TRAP FLUID SMOKE EVACUATOR (MISCELLANEOUS) ×1 IMPLANT
WATER STERILE IRR 500ML POUR (IV SOLUTION) ×1 IMPLANT
WRAPON POLAR PAD SHDR UNIV (MISCELLANEOUS) ×1

## 2022-08-20 NOTE — H&P (Signed)
History of Present Illness: Monica Leon is a 51 y.o. who presents today by referral of [facility] emergency room. Patient states that on 08/09/2022 she was crawling up a set of steps and when she went to stand up lost her balance and fell backwards. Landed on the right shoulder. She fell down 2 steps. Immediately had pain. Was seen in the emergency room where x-rays revealed a fracture of the proximal humerus. She was fitted with a sling and presents for follow-up evaluation. Denies any numbness, tingling or burning sensation to the right upper extremity.  Patient states that she was crawling up steps secondary to having decreased muscle tone of the lower extremities. She has lost over 80 pounds in the last year for which the etiology is unknown. She also has nonalcoholic liver disease according to the patient. States that she is not able to eat or drink.  Past Medical History:  Anxiety  Depression  GERD (gastroesophageal reflux disease)  Hypertension  Migraine  Muscle atrophy of lower extremity   Past Surgical History: GASTRIC BYPASS OPEN 2008  COLONOSCOPY WITH PROPOFOL 03/23/2022  ESOPHAGOGASTRODUENOSCOPY 03/23/2022  EGD 04/29/2022  EGD 07/28/2022   Past Family History: No Known Problems Mother  Prostate cancer Father   Medications: escitalopram oxalate (LEXAPRO) 20 MG tablet Take 20 mg by mouth 2 (two) times daily  ibuprofen (MOTRIN) 600 MG tablet Take 600 mg by mouth 2 (two) times daily as needed  metFORMIN (GLUCOPHAGE) 500 MG tablet Take 500 mg by mouth 2 (two) times daily with meals  metoclopramide (REGLAN) 10 MG tablet Take 10 mg by mouth every 8 (eight) hours as needed FOR NAUSEA  mirtazapine (REMERON) 7.5 MG tablet Take 7.5 mg by mouth at bedtime  multivitamin tablet Take 1 tablet by mouth once daily  omeprazole (PRILOSEC) 40 MG DR capsule Take 40 mg by mouth 2 (two) times daily before meals  ondansetron (ZOFRAN-ODT) 4 MG disintegrating tablet Take  4 mg by mouth every 8 (eight) hours as needed for Nausea or Vomiting  oxyCODONE-acetaminophen (PERCOCET) 5-325 mg tablet Take 1 tablet by mouth every 6 (six) hours as needed for Pain FOR UP TO 5 DAYS  traMADoL (ULTRAM) 50 mg tablet Take 50 mg by mouth once daily as needed for Pain  escitalopram oxalate (LEXAPRO) 10 MG tablet Take 10 mg by mouth once daily (Patient not taking: Reported on 16/11/958)  folic acid (FOLVITE) 1 MG tablet Take 1 mg by mouth once daily (Patient not taking: Reported on 08/12/2022)   Allergies: No Known Allergies   Review of Systems: A comprehensive 14 point ROS was performed, reviewed, and the pertinent orthopaedic findings are documented in the HPI.  Physical Exam: BP 130/80 (BP Location: Left upper arm, Patient Position: Sitting, BP Cuff Size: Adult)  Ht 162.6 cm (5' 4" )  Wt 67 kg (147 lb 12.8 oz)  BMI 25.37 kg/m   General: Well-developed well-nourished female seen in no acute distress.   HEENT: Atraumatic,normocephalic. Pupils are equal and reactive to light. Oropharynx is clear with moist mucosa  Lungs: Clear to auscultation bilaterally   Cardiovascular: Regular rate and rhythm. Normal S1, S2. No murmurs. No appreciable gallops or rubs. Peripheral pulses are palpable.  Abdomen: Soft, non-tender, nondistended. Bowel sounds present  Extremity: Patient is noted to have significant swelling to the right upper extremity along with significant ecchymotic tissue to the entire humerus region and a little bit to the forearm. She has good range of motion of the elbow. Full range of motion  of wrist and fingers.  Neurological: The patient is alert and oriented Sensation to light touch appears to be intact and within normal limits Gross motor strength appeared to be equal to 5/5 except to the right upper extremity. Specifically shoulder this was not tested.  Vascular : Peripheral pulses felt to be palpable. Capillary refill appears to be intact and within  normal limits  X-ray: Three views of the right shoulder ordered and interpreted on today's visit shows a 2 part comminuted and displaced fracture of the surgical neck of the proximal humerus.  Impression: 1.  Closed comminuted displaced surgical neck fracture, right proximal humerus.  Plan:  1.    H&P reviewed and patient re-examined. No changes.

## 2022-08-20 NOTE — Transfer of Care (Signed)
Immediate Anesthesia Transfer of Care Note  Patient: Monica Leon  Procedure(s) Performed: OPEN REDUCTION INTERNAL FIXATION (ORIF) PROXIMAL HUMERUS FRACTURE (Right: Shoulder)  Patient Location: PACU  Anesthesia Type:General  Level of Consciousness: awake, drowsy and patient cooperative  Airway & Oxygen Therapy: Patient Spontanous Breathing  Post-op Assessment: Report given to RN and Post -op Vital signs reviewed and stable  Post vital signs: Reviewed and stable  Last Vitals:  Vitals Value Taken Time  BP 86/55 08/20/22 1615  Temp 36.4 C 08/20/22 1615  Pulse 104 08/20/22 1620  Resp 20 08/20/22 1620  SpO2 98 % 08/20/22 1620  Vitals shown include unvalidated device data.  Last Pain:  Vitals:   08/20/22 1615  TempSrc:   PainSc: Asleep         Complications: No notable events documented.

## 2022-08-20 NOTE — Discharge Instructions (Addendum)
Orthopedic discharge instructions: May shower with intact OpSite dressing once nerve block has worn off (around Monday).  Apply ice frequently to shoulder. Take ibuprofen 600 mg TID with meals for 5-7 days, then as necessary. Take oxycodone as prescribed when needed.  May supplement with ES Tylenol if necessary. Keep shoulder immobilizer on at all times except may remove for bathing purposes. Follow-up in 10-14 days or as scheduled.POLAR CARE INFORMATION  [email or web address]  How to use Stacy Cold Therapy System?  YouTube   [email or web address]  OPERATING INSTRUCTIONS  Start the product With dry hands, connect the transformer to the electrical connection located on the top of the cooler. Next, plug the transformer into an appropriate electrical outlet. The unit will automatically start running at this point.  To stop the pump, disconnect electrical power.  Unplug to stop the product when not in use. Unplugging the Polar Care unit turns it off. Always unplug immediately after use. Never leave it plugged in while unattended. Remove pad.    FIRST ADD WATER TO FILL LINE, THEN ICE---Replace ice when existing ice is almost melted  1 Discuss Treatment with your [place] Practitioner and Use Only as Prescribed 2 Apply Insulation Barrier & Cold Therapy Pad 3 Check for Moisture 4 Inspect Skin Regularly  Tips and Trouble Shooting Usage Tips 1. Use cubed or chunked ice for optimal performance. 2. It is recommended to drain the Pad between uses. To drain the pad, hold the Pad upright with the hose pointed toward the ground. Depress the black plunger and allow water to drain out. 3. You may disconnect the Pad from the unit without removing the pad from the affected area by depressing the silver tabs on the hose coupling and gently pulling the hoses apart. The Pad and unit will seal itself and will not leak. Note: Some dripping during release is  normal. 4. DO NOT RUN PUMP WITHOUT WATER! The pump in this unit is designed to run with water. Running the unit without water will cause permanent damage to the pump. 5. Unplug unit before removing lid.  TROUBLESHOOTING GUIDE Pump not running, Water not flowing to the pad, Pad is not getting cold 1. Make sure the transformer is plugged into the wall outlet. 2. Confirm that the ice and water are filled to the indicated levels. 3. Make sure there are no kinks in the pad. 4. Gently pull on the blue tube to make sure the tube/pad junction is straight. 5. Remove the pad from the treatment site and ll it while the pad is lying at; then reapply. 6. Confirm that the pad couplings are securely attached to the unit. Listen for the double clicks (Figure 1) to confirm the pad couplings are securely attached.  Leaks    Note: Some condensation on the lines, controller, and pads is unavoidable, especially in warmer climates. 1. If using a [facility] Polar Care Cold Therapy unit with a detachable Cold Therapy Pad, and a leak exists (other than condensation on the lines) disconnect the pad couplings. Make sure the silver tabs on the couplings are depressed before reconnecting the pad to the pump hose; then confirm both sides of the coupling are properly clicked in. 2. If the coupling continues to leak or a leak is detected in the pad itself, stop using it and call [place] at [phone].  Cleaning After use, empty and dry the unit with a soft cloth. Warm water and mild detergent may be used occasionally to  clean the pump and tubes.  WARNING: The [place] can be cold enough to cause serious injury, including full skin necrosis. Follow these Operating Instructions, and carefully read the Product Insert (see pouch on side of unit) and the Cold Therapy Pad Fitting Instructions (provided with each Cold Therapy Pad) prior to use.       SHOULDER SLING IMMOBILIZER   VIDEO Slingshot 2 Shoulder  Brace Application - YouTube ---[email or web address]  INSTRUCTIONS While supporting the injured arm, slide the forearm into the sling. Wrap the adjustable shoulder strap around the neck and shoulders and attach the strap end to the sling using  the "alligator strap tab."  Adjust the shoulder strap to the required length. Position the shoulder pad behind the neck. To secure the shoulder pad location (optional), pull the shoulder strap away from the shoulder pad, unfold the hook material on the top of the pad, then press the shoulder strap back onto the hook material to secure the pad in place. Attach the closure strap across the open top of the sling. Position the strap so that it holds the arm securely in the sling. Next, attach the thumb strap to the open end of the sling between the thumb and fingers. After sling has been fit, it may be easily removed and reapplied using the quick release buckle on shoulder strap. If a neutral pillow or 15 abduction pillow is included, place the pillow at the waistline. Attach the sling to the pillow, lining up hook material on the pillow with the loop on sling. Adjust the waist strap to fit.  If waist strap is too long, cut it to fit. Use the small piece of double sided hook material (located on top of the pillow) to secure the strap end. Place the double sided hook material on the inside of the cut strap end and secure it to the waist strap.     If no pillow is included, attach the waist strap to the sling and adjust to fit.    Washing Instructions: Straps and sling must be removed and cleaned regularly depending on your activity level and perspiration. Hand wash straps and sling in cold water with mild detergent, rinse, air dry

## 2022-08-20 NOTE — Anesthesia Procedure Notes (Signed)
Procedure Name: Intubation Date/Time: 08/20/2022 1:44 PM  Performed by: Jonna Clark, CRNAPre-anesthesia Checklist: Patient identified, Patient being monitored, Timeout performed, Emergency Drugs available and Suction available Patient Re-evaluated:Patient Re-evaluated prior to induction Oxygen Delivery Method: Circle system utilized Preoxygenation: Pre-oxygenation with 100% oxygen Induction Type: IV induction Ventilation: Mask ventilation without difficulty Laryngoscope Size: 3 and McGraph Grade View: Grade I Tube type: Oral Tube size: 7.0 mm Number of attempts: 1 Airway Equipment and Method: Stylet Placement Confirmation: ETT inserted through vocal cords under direct vision, positive ETCO2 and breath sounds checked- equal and bilateral Secured at: 21 cm Tube secured with: Tape Dental Injury: Teeth and Oropharynx as per pre-operative assessment

## 2022-08-20 NOTE — OR Nursing (Signed)
Synthes wire implanted in patient's right shoulder

## 2022-08-20 NOTE — Op Note (Signed)
08/20/2022  3:52 PM  Patient:   Monica Leon  Pre-Op Diagnosis:   Closed two-part displaced surgical neck fracture, right proximal humerus.  Post-Op Diagnosis:   Same  Procedure:   Open reduction and internal fixation of closed comminuted two-part displaced surgical neck fracture, right proximal humerus.  Surgeon:   Pascal Lux, MD  Assistant:   Cameron Proud, PA-C  Anesthesia:   General endotracheal with interscalene block using Exparel placed preoperatively by the anesthesiologist.  Findings:   As above.  Complications:   None  EBL:   100 cc  Fluids:   1000 cc crystalloid  TT:   None  Drains:   None  Closure:   Staples  Implants:   Biomet 4-hole precontoured right proximal humerus locking plate  Brief Clinical Note:   The patient is a 51 year old female who sustained above-noted injury 8 days ago when she fell on some steps and landed on her right shoulder. She presented to the emergency room where x-rays demonstrated a minimally displaced surgical neck fracture of the proximal humerus. The patient was placed into a shoulder immobilizer and referred to orthopedics. Subsequent x-rays in the orthopedic office demonstrated unacceptable displacement of the fracture. Therefore, the patient presents at this time for definitive management of this injury.  Procedure:   The patient underwent placement of an interscalene block using Exparel in the preoperative holding area by anesthesia before being brought into the operating room and lain in the supine position. After adequate general endotracheal intubation and anesthesia were obtained, the patient was repositioned in the beach chair position using the beach chair positioner. The right shoulder and upper extremity were prepped with ChloraPrep solution before being draped sterilely. Preoperative antibiotics were administered. A timeout was performed to verify the appropriate surgical site.    A standard anterior approach to  the shoulder was made through an approximately 4-5 inch incision. The incision was carried down through the subcutaneous tissues to expose the deltopectoral fascia. The interval between the deltoid and pectoralis muscles was developed, retracting the cephalic vein laterally with the deltoid. The conjoined tendon was identified and retracted medially to access the proximal humerus. The biceps tendon was identified, as was the fracture itself. The fracture hematoma was debrided and the fracture gently reduced. In addition, several #0 Ethibond sutures were placed in the supraspinatus and infraspinatus tendons to help control the proximal humeral head.   The butterfly fragment along the medial aspect of the proximal humeral shaft was reduced and stabilized to 3 remaining shaft with an 18-gauge wire which was tightened securely. The excess wire was cut short and the knot of the wire twisted down onto the bone.  Attention was then directed to the primary fracture. The fracture was reduced before a 4-hole plate was selected and applied over the anterolateral aspect of the proximal humerus. A guidewire was drilled up through the plate into the humeral head and its position verified fluoroscopically in an AP view, as well as internal and external rotation views. After several adjustments, the guidewire was deemed to be in excellent position in all planes. The plate was secured to the humeral shaft using a bicortical screw. Again the adequacy of plate position and fracture reduction was verified fluoroscopically in AP and internal and external rotation views and found to be excellent. Multiple locking pegs of appropriate length were placed in the humeral head, verifying their length and position fluoroscopically in multiple planes to be sure that the pegs had not penetrated the humeral  head. The two more anterior screw options were filled with variable angle locking screws of appropriate length to be sure that the humeral  head was captured appropriately. The plate was then secured to the shaft using three additional bicortical locking screws. Again the adequacy of hardware position and fracture reduction was verified in all planes. The inferior most calcar peg was noted to be too long so it was removed and replaced with a shorter variable angle locking screw to be sure that it was within the humeral head. Again the adequacy of screw position and fracture reduction was verified in all planes and found to be excellent. Several #2 fiber wires were placed in the rotator cuff and passed through the appropriate holes in the plate, then tied securely to further stabilize the humeral head fragment to the plate.  Additional #2 FiberWires were used to secure the anteriormost portion of the deltoid insertional fibers to the adjacent pectoralis major insertional fibers, as well as to reapproximate the more superior portion of the pectoralis major tendon to the plate.  The wound was copiously irrigated with sterile saline solution using jet lavage irrigation before 30 cc of 0.5% Sensorcaine with epinephrine was injected into the pericapsular and peri-incisional tissues to help with postoperative analgesia. The deltopectoral interval was reapproximated using #0 Vicryl interrupted sutures. The subcutaneous tissues were closed using 2-0 Vicryl interrupted sutures before the skin was closed using staples. One gram of transexemic acid was administered intravenously by the anesthetist to help with postoperative bleeding. A sterile occlusive dressing was applied to the wound before the arm was placed into a shoulder immobilizer with abduction pillow. A Polar Care also was applied before the patient was awakened, extubated, and returned to the recovery room in satisfactory condition after tolerating the procedure well.

## 2022-08-20 NOTE — Anesthesia Procedure Notes (Signed)
Anesthesia Regional Block: Interscalene brachial plexus block   Pre-Anesthetic Checklist: , timeout performed,  Correct Patient, Correct Site, Correct Laterality,  Correct Procedure, Correct Position, site marked,  Risks and benefits discussed,  Surgical consent,  Pre-op evaluation,  At surgeon's request and post-op pain management  Laterality: Upper and Right  Prep: chloraprep       Needles:  Injection technique: Single-shot  Needle Type: Stimiplex     Needle Length: 9cm  Needle Gauge: 22     Additional Needles:   Procedures:,,,, ultrasound used (permanent image in chart),,    Narrative:  Start time: 08/20/2022 1:00 PM End time: 08/20/2022 1:03 PM Injection made incrementally with aspirations every 5 mL.  Performed by: Personally  Anesthesiologist: Maripat Borba, Precious Haws, MD  Additional Notes: Patient consented for risk and benefits of nerve block including but not limited to nerve damage, failed block, bleeding and infection.  Patient voiced understanding.  Functioning IV was confirmed and monitors were applied.  Timeout done prior to procedure and prior to any sedation being given to the patient.  Patient confirmed procedure site prior to any sedation given to the patient.  A 64m 22ga Stimuplex needle was used. Sterile prep,hand hygiene and sterile gloves were used.  Minimal sedation used for procedure.  No paresthesia endorsed by patient during the procedure.  Negative aspiration and negative test dose prior to incremental administration of local anesthetic. The patient tolerated the procedure well with no immediate complications.

## 2022-08-20 NOTE — Anesthesia Preprocedure Evaluation (Addendum)
Anesthesia Evaluation  Patient identified by MRN, date of birth, ID band Patient awake    Reviewed: Allergy & Precautions, NPO status , Patient's Chart, lab work & pertinent test results  History of Anesthesia Complications (+) PONV and history of anesthetic complications  Airway Mallampati: III  TM Distance: <3 FB Neck ROM: full    Dental  (+) Chipped   Pulmonary neg pulmonary ROS, neg shortness of breath,    Pulmonary exam normal        Cardiovascular Exercise Tolerance: Good hypertension, (-) angina(-) Past MI Normal cardiovascular exam     Neuro/Psych  Headaches, PSYCHIATRIC DISORDERS  Neuromuscular disease    GI/Hepatic Neg liver ROS, GERD  Controlled,  Endo/Other  negative endocrine ROS  Renal/GU      Musculoskeletal   Abdominal   Peds  Hematology negative hematology ROS (+)   Anesthesia Other Findings Past Medical History: No date: Anxiety No date: Depression No date: Fatty liver determined by biopsy No date: GERD (gastroesophageal reflux disease) No date: Hypertension     Comment:  off meds 1 year No date: Migraine No date: Muscle atrophy of lower extremity No date: PONV (postoperative nausea and vomiting)     Comment:  with ankle surgery 2023: Unintentional weight loss     Comment:  80 pounds in a year-has no appetite  Past Surgical History: 2005: ANKLE FRACTURE SURGERY; Left No date: CARDIAC CATHETERIZATION 03/23/2022: COLONOSCOPY WITH PROPOFOL; N/A     Comment:  Procedure: COLONOSCOPY WITH PROPOFOL;  Surgeon: Lin Landsman, MD;  Location: [facility] ENDOSCOPY;  Service:               Gastroenterology;  Laterality: N/A; 03/23/2022: ESOPHAGOGASTRODUODENOSCOPY; N/A     Comment:  Procedure: ESOPHAGOGASTRODUODENOSCOPY (EGD);  Surgeon:               Lin Landsman, MD;  Location: [facility] ENDOSCOPY;                Service: Gastroenterology;  Laterality: N/A; 04/29/2022:  ESOPHAGOGASTRODUODENOSCOPY (EGD) WITH PROPOFOL; N/A     Comment:  Procedure: ESOPHAGOGASTRODUODENOSCOPY (EGD) WITH               PROPOFOL;  Surgeon: Lin Landsman, MD;  Location:               [facility] ENDOSCOPY;  Service: Gastroenterology;  Laterality:               N/A; 07/28/2022: ESOPHAGOGASTRODUODENOSCOPY (EGD) WITH PROPOFOL; N/A     Comment:  Procedure: ESOPHAGOGASTRODUODENOSCOPY (EGD) WITH               PROPOFOL;  Surgeon: Lin Landsman, MD;  Location:               [facility] ENDOSCOPY;  Service: Gastroenterology;  Laterality:               N/A; 2008: GASTRIC BYPASS  BMI    Body Mass Index: 25.06 kg/m      Reproductive/Obstetrics negative OB ROS                             Anesthesia Physical Anesthesia Plan  ASA: 3  Anesthesia Plan: General ETT   Post-op Pain Management: Regional block*   Induction: Intravenous  PONV Risk Score and Plan: Ondansetron, Dexamethasone, Midazolam and Treatment may vary due to age or medical condition  Airway Management Planned: Oral ETT and Video Laryngoscope Planned  Additional Equipment:   Intra-op Plan:   Post-operative Plan: Extubation in OR  Informed Consent: I have reviewed the patients History and Physical, chart, labs and discussed the procedure including the risks, benefits and alternatives for the proposed anesthesia with the patient or authorized representative who has indicated his/her understanding and acceptance.     Dental Advisory Given  Plan Discussed with: Anesthesiologist, CRNA and Surgeon  Anesthesia Plan Comments: (Patient consented for risks of anesthesia including but not limited to:  - adverse reactions to medications - damage to eyes, teeth, lips or other oral mucosa - nerve damage due to positioning  - sore throat or hoarseness - Damage to heart, brain, nerves, lungs, other parts of body or loss of life  Patient voiced understanding.)      Anesthesia Quick  Evaluation

## 2022-08-21 ENCOUNTER — Encounter: Payer: Self-pay | Admitting: Surgery

## 2022-08-21 NOTE — Anesthesia Postprocedure Evaluation (Signed)
Anesthesia Post Note  Patient: Monica Leon  Procedure(s) Performed: OPEN REDUCTION INTERNAL FIXATION (ORIF) PROXIMAL HUMERUS FRACTURE (Right: Shoulder)  Patient location during evaluation: PACU Anesthesia Type: General Level of consciousness: awake and alert Pain management: pain level controlled Vital Signs Assessment: post-procedure vital signs reviewed and stable Respiratory status: spontaneous breathing, nonlabored ventilation, respiratory function stable and patient connected to nasal cannula oxygen Cardiovascular status: blood pressure returned to baseline and stable Postop Assessment: no apparent nausea or vomiting Anesthetic complications: no   No notable events documented.   Last Vitals:  Vitals:   08/20/22 1700 08/20/22 1721  BP: 93/61 110/77  Pulse: (!) 105 (!) 111  Resp: 15 16  Temp: 36.6 C 36.9 C  SpO2: 98% 100%    Last Pain:  Vitals:   08/20/22 1721  TempSrc: Temporal  PainSc: 0-No pain                 Precious Haws Ankit Degregorio

## 2022-08-24 ENCOUNTER — Emergency Department
Admission: EM | Admit: 2022-08-24 | Discharge: 2022-08-24 | Discharge status: Against Medical Advice | Payer: BC Managed Care – PPO | Attending: Emergency Medicine | Admitting: Emergency Medicine

## 2022-08-24 DIAGNOSIS — M6281 Muscle weakness (generalized): Secondary | ICD-10-CM | POA: Diagnosis not present

## 2022-08-24 DIAGNOSIS — R262 Difficulty in walking, not elsewhere classified: Secondary | ICD-10-CM | POA: Diagnosis not present

## 2022-08-24 DIAGNOSIS — R55 Syncope and collapse: Secondary | ICD-10-CM | POA: Diagnosis not present

## 2022-08-24 DIAGNOSIS — M868X2 Other osteomyelitis, upper arm: Secondary | ICD-10-CM | POA: Diagnosis not present

## 2022-08-24 DIAGNOSIS — G990 Autonomic neuropathy in diseases classified elsewhere: Secondary | ICD-10-CM | POA: Diagnosis not present

## 2022-08-24 DIAGNOSIS — R809 Proteinuria, unspecified: Secondary | ICD-10-CM | POA: Diagnosis not present

## 2022-08-24 DIAGNOSIS — R296 Repeated falls: Secondary | ICD-10-CM | POA: Diagnosis not present

## 2022-08-24 DIAGNOSIS — S42294D Other nondisplaced fracture of upper end of right humerus, subsequent encounter for fracture with routine healing: Secondary | ICD-10-CM | POA: Diagnosis not present

## 2022-08-24 DIAGNOSIS — K828 Other specified diseases of gallbladder: Secondary | ICD-10-CM | POA: Diagnosis not present

## 2022-08-24 DIAGNOSIS — Z4659 Encounter for fitting and adjustment of other gastrointestinal appliance and device: Secondary | ICD-10-CM | POA: Diagnosis not present

## 2022-08-24 DIAGNOSIS — K2210 Ulcer of esophagus without bleeding: Secondary | ICD-10-CM | POA: Diagnosis not present

## 2022-08-24 DIAGNOSIS — K30 Functional dyspepsia: Secondary | ICD-10-CM | POA: Diagnosis not present

## 2022-08-24 DIAGNOSIS — R627 Adult failure to thrive: Secondary | ICD-10-CM | POA: Diagnosis not present

## 2022-08-24 DIAGNOSIS — I10 Essential (primary) hypertension: Secondary | ICD-10-CM | POA: Diagnosis not present

## 2022-08-24 DIAGNOSIS — Z862 Personal history of diseases of the blood and blood-forming organs and certain disorders involving the immune mechanism: Secondary | ICD-10-CM | POA: Diagnosis not present

## 2022-08-24 DIAGNOSIS — T148XXA Other injury of unspecified body region, initial encounter: Secondary | ICD-10-CM | POA: Diagnosis not present

## 2022-08-24 DIAGNOSIS — E43 Unspecified severe protein-calorie malnutrition: Secondary | ICD-10-CM | POA: Diagnosis not present

## 2022-08-24 DIAGNOSIS — X58XXXA Exposure to other specified factors, initial encounter: Secondary | ICD-10-CM | POA: Diagnosis not present

## 2022-08-24 DIAGNOSIS — G621 Alcoholic polyneuropathy: Secondary | ICD-10-CM | POA: Diagnosis not present

## 2022-08-24 DIAGNOSIS — W1831XA Fall on same level due to stepping on an object, initial encounter: Secondary | ICD-10-CM | POA: Diagnosis not present

## 2022-08-24 DIAGNOSIS — E8729 Other acidosis: Secondary | ICD-10-CM | POA: Diagnosis not present

## 2022-08-24 DIAGNOSIS — S42221S 2-part displaced fracture of surgical neck of right humerus, sequela: Secondary | ICD-10-CM | POA: Diagnosis not present

## 2022-08-24 DIAGNOSIS — T730XXA Starvation, initial encounter: Secondary | ICD-10-CM | POA: Diagnosis not present

## 2022-08-24 DIAGNOSIS — S42291A Other displaced fracture of upper end of right humerus, initial encounter for closed fracture: Secondary | ICD-10-CM | POA: Diagnosis not present

## 2022-08-24 DIAGNOSIS — B9561 Methicillin susceptible Staphylococcus aureus infection as the cause of diseases classified elsewhere: Secondary | ICD-10-CM | POA: Diagnosis not present

## 2022-08-24 DIAGNOSIS — E512 Wernicke's encephalopathy: Secondary | ICD-10-CM | POA: Diagnosis not present

## 2022-08-24 DIAGNOSIS — S42291S Other displaced fracture of upper end of right humerus, sequela: Secondary | ICD-10-CM | POA: Diagnosis not present

## 2022-08-24 DIAGNOSIS — K700 Alcoholic fatty liver: Secondary | ICD-10-CM | POA: Diagnosis not present

## 2022-08-24 DIAGNOSIS — D638 Anemia in other chronic diseases classified elsewhere: Secondary | ICD-10-CM | POA: Diagnosis not present

## 2022-08-24 DIAGNOSIS — T8142XA Infection following a procedure, deep incisional surgical site, initial encounter: Secondary | ICD-10-CM | POA: Diagnosis not present

## 2022-08-24 DIAGNOSIS — Y33XXXA Other specified events, undetermined intent, initial encounter: Secondary | ICD-10-CM | POA: Diagnosis not present

## 2022-08-24 DIAGNOSIS — F32A Depression, unspecified: Secondary | ICD-10-CM | POA: Diagnosis not present

## 2022-08-24 DIAGNOSIS — T8149XD Infection following a procedure, other surgical site, subsequent encounter: Secondary | ICD-10-CM | POA: Diagnosis not present

## 2022-08-24 DIAGNOSIS — S42201A Unspecified fracture of upper end of right humerus, initial encounter for closed fracture: Secondary | ICD-10-CM | POA: Diagnosis not present

## 2022-08-24 DIAGNOSIS — T8149XA Infection following a procedure, other surgical site, initial encounter: Secondary | ICD-10-CM | POA: Diagnosis not present

## 2022-08-24 DIAGNOSIS — Z5321 Procedure and treatment not carried out due to patient leaving prior to being seen by health care provider: Secondary | ICD-10-CM | POA: Insufficient documentation

## 2022-08-24 DIAGNOSIS — Z9884 Bariatric surgery status: Secondary | ICD-10-CM | POA: Diagnosis not present

## 2022-08-24 DIAGNOSIS — S42221A 2-part displaced fracture of surgical neck of right humerus, initial encounter for closed fracture: Secondary | ICD-10-CM | POA: Diagnosis not present

## 2022-08-24 DIAGNOSIS — R2231 Localized swelling, mass and lump, right upper limb: Secondary | ICD-10-CM | POA: Diagnosis not present

## 2022-08-24 DIAGNOSIS — M9689 Other intraoperative and postprocedural complications and disorders of the musculoskeletal system: Secondary | ICD-10-CM | POA: Diagnosis not present

## 2022-08-24 DIAGNOSIS — T8141XA Infection following a procedure, superficial incisional surgical site, initial encounter: Secondary | ICD-10-CM | POA: Diagnosis not present

## 2022-08-24 DIAGNOSIS — R2681 Unsteadiness on feet: Secondary | ICD-10-CM | POA: Diagnosis not present

## 2022-08-24 DIAGNOSIS — D62 Acute posthemorrhagic anemia: Secondary | ICD-10-CM | POA: Diagnosis not present

## 2022-08-24 DIAGNOSIS — R531 Weakness: Secondary | ICD-10-CM | POA: Diagnosis not present

## 2022-08-24 DIAGNOSIS — E119 Type 2 diabetes mellitus without complications: Secondary | ICD-10-CM | POA: Diagnosis not present

## 2022-08-24 DIAGNOSIS — S0990XA Unspecified injury of head, initial encounter: Secondary | ICD-10-CM | POA: Diagnosis not present

## 2022-08-24 DIAGNOSIS — G9341 Metabolic encephalopathy: Secondary | ICD-10-CM | POA: Diagnosis not present

## 2022-08-24 DIAGNOSIS — M6250 Muscle wasting and atrophy, not elsewhere classified, unspecified site: Secondary | ICD-10-CM | POA: Diagnosis not present

## 2022-08-24 DIAGNOSIS — K449 Diaphragmatic hernia without obstruction or gangrene: Secondary | ICD-10-CM | POA: Diagnosis not present

## 2022-08-24 DIAGNOSIS — F05 Delirium due to known physiological condition: Secondary | ICD-10-CM | POA: Diagnosis not present

## 2022-08-24 DIAGNOSIS — R634 Abnormal weight loss: Secondary | ICD-10-CM | POA: Diagnosis not present

## 2022-08-24 DIAGNOSIS — G2581 Restless legs syndrome: Secondary | ICD-10-CM | POA: Diagnosis not present

## 2022-08-24 DIAGNOSIS — R0902 Hypoxemia: Secondary | ICD-10-CM | POA: Diagnosis not present

## 2022-08-24 DIAGNOSIS — R7881 Bacteremia: Secondary | ICD-10-CM | POA: Diagnosis not present

## 2022-08-24 DIAGNOSIS — R0789 Other chest pain: Secondary | ICD-10-CM | POA: Diagnosis not present

## 2022-08-24 DIAGNOSIS — G9009 Other idiopathic peripheral autonomic neuropathy: Secondary | ICD-10-CM | POA: Diagnosis not present

## 2022-08-24 DIAGNOSIS — A4901 Methicillin susceptible Staphylococcus aureus infection, unspecified site: Secondary | ICD-10-CM | POA: Diagnosis not present

## 2022-08-24 DIAGNOSIS — S42201D Unspecified fracture of upper end of right humerus, subsequent encounter for fracture with routine healing: Secondary | ICD-10-CM | POA: Diagnosis not present

## 2022-08-24 DIAGNOSIS — L02413 Cutaneous abscess of right upper limb: Secondary | ICD-10-CM | POA: Diagnosis not present

## 2022-08-24 DIAGNOSIS — B957 Other staphylococcus as the cause of diseases classified elsewhere: Secondary | ICD-10-CM | POA: Diagnosis not present

## 2022-08-24 DIAGNOSIS — K760 Fatty (change of) liver, not elsewhere classified: Secondary | ICD-10-CM | POA: Diagnosis not present

## 2022-08-24 DIAGNOSIS — R413 Other amnesia: Secondary | ICD-10-CM | POA: Diagnosis not present

## 2022-08-24 DIAGNOSIS — W108XXD Fall (on) (from) other stairs and steps, subsequent encounter: Secondary | ICD-10-CM | POA: Diagnosis not present

## 2022-08-24 DIAGNOSIS — R64 Cachexia: Secondary | ICD-10-CM | POA: Diagnosis not present

## 2022-08-24 DIAGNOSIS — N179 Acute kidney failure, unspecified: Secondary | ICD-10-CM | POA: Diagnosis not present

## 2022-08-24 DIAGNOSIS — R000 Tachycardia, unspecified: Secondary | ICD-10-CM | POA: Diagnosis not present

## 2022-08-24 DIAGNOSIS — R079 Chest pain, unspecified: Secondary | ICD-10-CM | POA: Diagnosis not present

## 2022-08-24 NOTE — ED Triage Notes (Signed)
First nurse note: Patient arrived by EMS from home with c/o syncopal episode. Stood for EMS with assistance. Recent surgery on right shoulder  EMS vitals: 120P 125CBG 150/100 b/p

## 2022-08-25 ENCOUNTER — Ambulatory Visit: Payer: BC Managed Care – PPO | Admitting: Gastroenterology

## 2022-08-31 ENCOUNTER — Other Ambulatory Visit: Payer: Self-pay | Admitting: Internal Medicine

## 2022-08-31 NOTE — Addendum Note (Signed)
Addended by: Jearld Fenton on: 08/31/2022 10:19 AM   Modules accepted: Level of Service

## 2022-09-01 NOTE — Telephone Encounter (Signed)
Requested medication (s) are due for refill today yes  Requested medication (s) are on the active medication list: yes  Last refill:  08/04/22  Future visit scheduled: yes  Notes to clinic:  Unable to refill per protocol, last refill by another provider. Historical medication, routing for approval.     Requested Prescriptions  Pending Prescriptions Disp Refills   mirtazapine (REMERON) 7.5 MG tablet [Pharmacy Med Name: MIRTAZAPINE 7.5MG TABLETS] 30 tablet     Sig: TAKE 1 TABLET(7.5 MG) BY MOUTH AT BEDTIME     Psychiatry: Antidepressants - mirtazapine Passed - 08/31/2022  3:17 AM      Passed - Completed PHQ-2 or PHQ-9 in the last 360 days      Passed - Valid encounter within last 6 months    Recent Outpatient Visits           4 weeks ago Gastroesophageal reflux disease without esophagitis   [place], NP   5 months ago Encounter for general adult medical examination with abnormal findings   [facility], Coralie Keens, NP   6 months ago Chronic cough   [facility] [place], Coralie Keens, NP   7 months ago Subacute cough   [facility] [place], Coralie Keens, NP   8 months ago Acute cough   [facility] [place], Coralie Keens, NP       Future Appointments             In 6 days [place], Coralie Keens, NP [facility], [facility]

## 2022-09-07 ENCOUNTER — Ambulatory Visit: Payer: BC Managed Care – PPO | Admitting: Internal Medicine

## 2022-09-25 ENCOUNTER — Inpatient Hospital Stay: Payer: BC Managed Care – PPO | Admitting: Internal Medicine

## 2022-09-25 DIAGNOSIS — F05 Delirium due to known physiological condition: Secondary | ICD-10-CM | POA: Diagnosis not present

## 2022-09-25 DIAGNOSIS — F32A Depression, unspecified: Secondary | ICD-10-CM | POA: Diagnosis not present

## 2022-09-25 DIAGNOSIS — T8149XD Infection following a procedure, other surgical site, subsequent encounter: Secondary | ICD-10-CM | POA: Diagnosis not present

## 2022-09-25 DIAGNOSIS — E512 Wernicke's encephalopathy: Secondary | ICD-10-CM | POA: Diagnosis not present

## 2022-09-25 DIAGNOSIS — M6250 Muscle wasting and atrophy, not elsewhere classified, unspecified site: Secondary | ICD-10-CM | POA: Diagnosis not present

## 2022-09-25 DIAGNOSIS — G990 Autonomic neuropathy in diseases classified elsewhere: Secondary | ICD-10-CM | POA: Diagnosis not present

## 2022-09-25 DIAGNOSIS — F411 Generalized anxiety disorder: Secondary | ICD-10-CM | POA: Diagnosis not present

## 2022-09-25 DIAGNOSIS — B9561 Methicillin susceptible Staphylococcus aureus infection as the cause of diseases classified elsewhere: Secondary | ICD-10-CM | POA: Diagnosis not present

## 2022-09-25 DIAGNOSIS — R262 Difficulty in walking, not elsewhere classified: Secondary | ICD-10-CM | POA: Diagnosis not present

## 2022-09-25 DIAGNOSIS — E119 Type 2 diabetes mellitus without complications: Secondary | ICD-10-CM | POA: Diagnosis not present

## 2022-09-25 DIAGNOSIS — S42221A 2-part displaced fracture of surgical neck of right humerus, initial encounter for closed fracture: Secondary | ICD-10-CM | POA: Diagnosis not present

## 2022-09-25 DIAGNOSIS — S42201D Unspecified fracture of upper end of right humerus, subsequent encounter for fracture with routine healing: Secondary | ICD-10-CM | POA: Diagnosis not present

## 2022-09-25 DIAGNOSIS — R079 Chest pain, unspecified: Secondary | ICD-10-CM | POA: Diagnosis not present

## 2022-09-25 DIAGNOSIS — G9009 Other idiopathic peripheral autonomic neuropathy: Secondary | ICD-10-CM | POA: Diagnosis not present

## 2022-09-25 DIAGNOSIS — R634 Abnormal weight loss: Secondary | ICD-10-CM | POA: Diagnosis not present

## 2022-09-25 DIAGNOSIS — E43 Unspecified severe protein-calorie malnutrition: Secondary | ICD-10-CM | POA: Diagnosis not present

## 2022-09-25 DIAGNOSIS — R531 Weakness: Secondary | ICD-10-CM | POA: Diagnosis not present

## 2022-09-25 DIAGNOSIS — L02413 Cutaneous abscess of right upper limb: Secondary | ICD-10-CM | POA: Diagnosis not present

## 2022-09-25 DIAGNOSIS — R296 Repeated falls: Secondary | ICD-10-CM | POA: Diagnosis not present

## 2022-09-25 DIAGNOSIS — F4320 Adjustment disorder, unspecified: Secondary | ICD-10-CM | POA: Diagnosis not present

## 2022-09-25 DIAGNOSIS — R2681 Unsteadiness on feet: Secondary | ICD-10-CM | POA: Diagnosis not present

## 2022-09-25 DIAGNOSIS — M6281 Muscle weakness (generalized): Secondary | ICD-10-CM | POA: Diagnosis not present

## 2022-09-30 DIAGNOSIS — S42221A 2-part displaced fracture of surgical neck of right humerus, initial encounter for closed fracture: Secondary | ICD-10-CM | POA: Diagnosis not present

## 2022-10-09 DIAGNOSIS — R2681 Unsteadiness on feet: Secondary | ICD-10-CM | POA: Diagnosis not present

## 2022-10-09 DIAGNOSIS — R262 Difficulty in walking, not elsewhere classified: Secondary | ICD-10-CM | POA: Diagnosis not present

## 2022-10-09 DIAGNOSIS — R531 Weakness: Secondary | ICD-10-CM | POA: Diagnosis not present

## 2022-10-09 DIAGNOSIS — R296 Repeated falls: Secondary | ICD-10-CM | POA: Diagnosis not present

## 2022-10-09 DIAGNOSIS — M6281 Muscle weakness (generalized): Secondary | ICD-10-CM | POA: Diagnosis not present

## 2022-10-09 DIAGNOSIS — S42201D Unspecified fracture of upper end of right humerus, subsequent encounter for fracture with routine healing: Secondary | ICD-10-CM | POA: Diagnosis not present

## 2022-10-12 DIAGNOSIS — S42201D Unspecified fracture of upper end of right humerus, subsequent encounter for fracture with routine healing: Secondary | ICD-10-CM | POA: Diagnosis not present

## 2022-10-12 DIAGNOSIS — R262 Difficulty in walking, not elsewhere classified: Secondary | ICD-10-CM | POA: Diagnosis not present

## 2022-10-12 DIAGNOSIS — R296 Repeated falls: Secondary | ICD-10-CM | POA: Diagnosis not present

## 2022-10-12 DIAGNOSIS — M6281 Muscle weakness (generalized): Secondary | ICD-10-CM | POA: Diagnosis not present

## 2022-10-12 DIAGNOSIS — R531 Weakness: Secondary | ICD-10-CM | POA: Diagnosis not present

## 2022-10-12 DIAGNOSIS — R2681 Unsteadiness on feet: Secondary | ICD-10-CM | POA: Diagnosis not present

## 2022-10-13 DIAGNOSIS — R296 Repeated falls: Secondary | ICD-10-CM | POA: Diagnosis not present

## 2022-10-13 DIAGNOSIS — R2681 Unsteadiness on feet: Secondary | ICD-10-CM | POA: Diagnosis not present

## 2022-10-13 DIAGNOSIS — X58XXXD Exposure to other specified factors, subsequent encounter: Secondary | ICD-10-CM | POA: Diagnosis not present

## 2022-10-13 DIAGNOSIS — S42201D Unspecified fracture of upper end of right humerus, subsequent encounter for fracture with routine healing: Secondary | ICD-10-CM | POA: Diagnosis not present

## 2022-10-13 DIAGNOSIS — R531 Weakness: Secondary | ICD-10-CM | POA: Diagnosis not present

## 2022-10-13 DIAGNOSIS — M6281 Muscle weakness (generalized): Secondary | ICD-10-CM | POA: Diagnosis not present

## 2022-10-13 DIAGNOSIS — R262 Difficulty in walking, not elsewhere classified: Secondary | ICD-10-CM | POA: Diagnosis not present

## 2022-10-14 DIAGNOSIS — R296 Repeated falls: Secondary | ICD-10-CM | POA: Diagnosis not present

## 2022-10-14 DIAGNOSIS — R262 Difficulty in walking, not elsewhere classified: Secondary | ICD-10-CM | POA: Diagnosis not present

## 2022-10-14 DIAGNOSIS — R531 Weakness: Secondary | ICD-10-CM | POA: Diagnosis not present

## 2022-10-14 DIAGNOSIS — S42201D Unspecified fracture of upper end of right humerus, subsequent encounter for fracture with routine healing: Secondary | ICD-10-CM | POA: Diagnosis not present

## 2022-10-14 DIAGNOSIS — R2681 Unsteadiness on feet: Secondary | ICD-10-CM | POA: Diagnosis not present

## 2022-10-14 DIAGNOSIS — M6281 Muscle weakness (generalized): Secondary | ICD-10-CM | POA: Diagnosis not present

## 2022-10-15 DIAGNOSIS — R531 Weakness: Secondary | ICD-10-CM | POA: Diagnosis not present

## 2022-10-15 DIAGNOSIS — S42201D Unspecified fracture of upper end of right humerus, subsequent encounter for fracture with routine healing: Secondary | ICD-10-CM | POA: Diagnosis not present

## 2022-10-15 DIAGNOSIS — R296 Repeated falls: Secondary | ICD-10-CM | POA: Diagnosis not present

## 2022-10-15 DIAGNOSIS — R2681 Unsteadiness on feet: Secondary | ICD-10-CM | POA: Diagnosis not present

## 2022-10-15 DIAGNOSIS — R262 Difficulty in walking, not elsewhere classified: Secondary | ICD-10-CM | POA: Diagnosis not present

## 2022-10-15 DIAGNOSIS — M6281 Muscle weakness (generalized): Secondary | ICD-10-CM | POA: Diagnosis not present

## 2022-10-16 DIAGNOSIS — R262 Difficulty in walking, not elsewhere classified: Secondary | ICD-10-CM | POA: Diagnosis not present

## 2022-10-16 DIAGNOSIS — R2681 Unsteadiness on feet: Secondary | ICD-10-CM | POA: Diagnosis not present

## 2022-10-16 DIAGNOSIS — S42201D Unspecified fracture of upper end of right humerus, subsequent encounter for fracture with routine healing: Secondary | ICD-10-CM | POA: Diagnosis not present

## 2022-10-16 DIAGNOSIS — M6281 Muscle weakness (generalized): Secondary | ICD-10-CM | POA: Diagnosis not present

## 2022-10-16 DIAGNOSIS — R296 Repeated falls: Secondary | ICD-10-CM | POA: Diagnosis not present

## 2022-10-16 DIAGNOSIS — R531 Weakness: Secondary | ICD-10-CM | POA: Diagnosis not present

## 2022-10-19 DIAGNOSIS — R296 Repeated falls: Secondary | ICD-10-CM | POA: Diagnosis not present

## 2022-10-19 DIAGNOSIS — R2681 Unsteadiness on feet: Secondary | ICD-10-CM | POA: Diagnosis not present

## 2022-10-19 DIAGNOSIS — S42201D Unspecified fracture of upper end of right humerus, subsequent encounter for fracture with routine healing: Secondary | ICD-10-CM | POA: Diagnosis not present

## 2022-10-19 DIAGNOSIS — R262 Difficulty in walking, not elsewhere classified: Secondary | ICD-10-CM | POA: Diagnosis not present

## 2022-10-19 DIAGNOSIS — M6281 Muscle weakness (generalized): Secondary | ICD-10-CM | POA: Diagnosis not present

## 2022-10-19 DIAGNOSIS — R531 Weakness: Secondary | ICD-10-CM | POA: Diagnosis not present

## 2022-10-20 ENCOUNTER — Telehealth: Payer: Self-pay

## 2022-10-20 DIAGNOSIS — R531 Weakness: Secondary | ICD-10-CM | POA: Diagnosis not present

## 2022-10-20 DIAGNOSIS — R262 Difficulty in walking, not elsewhere classified: Secondary | ICD-10-CM | POA: Diagnosis not present

## 2022-10-20 DIAGNOSIS — R296 Repeated falls: Secondary | ICD-10-CM | POA: Diagnosis not present

## 2022-10-20 DIAGNOSIS — R2681 Unsteadiness on feet: Secondary | ICD-10-CM | POA: Diagnosis not present

## 2022-10-20 DIAGNOSIS — S42201D Unspecified fracture of upper end of right humerus, subsequent encounter for fracture with routine healing: Secondary | ICD-10-CM | POA: Diagnosis not present

## 2022-10-20 DIAGNOSIS — R4182 Altered mental status, unspecified: Secondary | ICD-10-CM | POA: Diagnosis not present

## 2022-10-20 DIAGNOSIS — M6281 Muscle weakness (generalized): Secondary | ICD-10-CM | POA: Diagnosis not present

## 2022-10-20 NOTE — Telephone Encounter (Signed)
I do have this paperwork

## 2022-10-20 NOTE — Telephone Encounter (Signed)
Please review.  Do you have paperwork for pt?  Thanks,   -Mickel Baas

## 2022-10-20 NOTE — Telephone Encounter (Signed)
Copied from [place] [phone]. Topic: General - Other >> Oct 19, 2022  4:53 PM Oley Balm E wrote: Reason for CRM:  Pt is in a rehab facility and can only be contacted via number below. She says this paperwork is crucial because they are a withholding her paychecks until this paperwork is submitted. Please advise, pt is requesting for this to be completed urgently   Best contact: [phone]

## 2022-10-21 ENCOUNTER — Ambulatory Visit (INDEPENDENT_AMBULATORY_CARE_PROVIDER_SITE_OTHER): Payer: BC Managed Care – PPO | Admitting: Internal Medicine

## 2022-10-21 DIAGNOSIS — S42411G Displaced simple supracondylar fracture without intercondylar fracture of right humerus, subsequent encounter for fracture with delayed healing: Secondary | ICD-10-CM | POA: Diagnosis not present

## 2022-10-21 DIAGNOSIS — R7881 Bacteremia: Secondary | ICD-10-CM | POA: Diagnosis not present

## 2022-10-21 DIAGNOSIS — K700 Alcoholic fatty liver: Secondary | ICD-10-CM

## 2022-10-21 DIAGNOSIS — R634 Abnormal weight loss: Secondary | ICD-10-CM

## 2022-10-21 DIAGNOSIS — E878 Other disorders of electrolyte and fluid balance, not elsewhere classified: Secondary | ICD-10-CM

## 2022-10-21 DIAGNOSIS — B9561 Methicillin susceptible Staphylococcus aureus infection as the cause of diseases classified elsewhere: Secondary | ICD-10-CM

## 2022-10-21 NOTE — Progress Notes (Signed)
Virtual Visit via Telephone Note  I connected with Monica Leon on 10/21/22 at  8:20 AM EST by telephone and verified that I am speaking with the correct person using two identifiers.  Location: Patient: Rehab facility Provider: Office  Persons participating in this telephone call: Webb Silversmith and [place]   I discussed the limitations, risks, security and privacy concerns of performing an evaluation and management service by telephone and the availability of in person appointments. I also discussed with the patient that there may be a patient responsible charge related to this service. The patient expressed understanding and agreed to proceed.   History of Present Illness:  Patient due for hospital follow-up and FMLA form completion.  She initially presented to [facility] 10/1 with complaint of right shoulder pain status post a fall.  X-ray of the right shoulder showed:  IMPRESSION: Comminuted and mildly displaced proximal humeral fracture involving the metaphysis and surgical neck. No involvement of the glenohumeral joint.  She was placed in a sling, discharged at that time.  She saw [facility] 10/4 for the initial consult.  She was continued in her shoulder immobilizer and surgery was scheduled.  She subsequently underwent an ORIF of the right humerus 10/12 by Dr. Roland Rack.  She was discharged the same day.  She subsequently went to [facility] ER 10/16 with complaint of nausea, vomiting and unintentional weight loss.  She left without being seen.  She did go to [facility] 10/16 for the same.  She was admitted for 30 days.  She had to undergo I&D of the right humerus x 4 secondary to MSSA bacteremia.  She was subsequently placed on Ancef for 6-week.  Because of her poor nutrition she had to get a PEG tube for feeds.  She was placed on mirtazapine, multivitamins, thiamine and folate.  She was discharged from [facility] 11/17 and admitted to the [facility] [place].  She plans to discharge from the SNF  on 10/23/2022.  She has follow-up with nutrition, GI, orthopedics plan.  She plans to go home with use of a walker, bedside commode and wheelchair.  She is requesting FMLA form completion from 10/2 to 11/16/2022.  She plans to return half days at that time.  Past Medical History:  Diagnosis Date   Anxiety    Depression    Fatty liver determined by biopsy    GERD (gastroesophageal reflux disease)    Hypertension    off meds 1 year   Migraine    Muscle atrophy of lower extremity    PONV (postoperative nausea and vomiting)    with ankle surgery   Unintentional weight loss 2023   80 pounds in a year-has no appetite    Current Outpatient Medications  Medication Sig Dispense Refill   escitalopram (LEXAPRO) 20 MG tablet Take 20 mg by mouth every morning.     ibuprofen (ADVIL) 600 MG tablet Take by mouth.     metFORMIN (GLUCOPHAGE) 500 MG tablet TAKE 1 TABLET(500 MG) BY MOUTH TWICE DAILY WITH A MEAL (Patient taking differently: Take 500 mg by mouth 2 (two) times daily.) 60 tablet 2   metFORMIN (GLUCOPHAGE) 500 MG tablet Take by mouth.     metoCLOPramide (REGLAN) 10 MG tablet Take 1 tablet (10 mg total) by mouth every 8 (eight) hours as needed for nausea. 30 tablet 0   mirtazapine (REMERON) 7.5 MG tablet TAKE 1 TABLET(7.5 MG) BY MOUTH AT BEDTIME 90 tablet 0   Multiple Vitamin (MULTIVITAMIN) tablet Take 1 tablet by  mouth daily.     omeprazole (PRILOSEC) 40 MG capsule Take by mouth.     ondansetron (ZOFRAN-ODT) 4 MG disintegrating tablet Take 1 tablet (4 mg total) by mouth every 8 (eight) hours as needed for nausea or vomiting. 30 tablet 0   oxyCODONE (ROXICODONE) 5 MG immediate release tablet Take 1-2 tablets (5-10 mg total) by mouth every 4 (four) hours as needed for severe pain or moderate pain. 40 tablet 0   No current facility-administered medications for this visit.    No Known Allergies  Family History  Problem Relation Age of Onset   Healthy Mother    Prostate cancer Father      Social History   Socioeconomic History   Marital status: Single    Spouse name: Not on file   Number of children: Not on file   Years of education: Not on file   Highest education level: Not on file  Occupational History   Not on file  Tobacco Use   Smoking status: Never   Smokeless tobacco: Never  Vaping Use   Vaping Use: Never used  Substance and Sexual Activity   Alcohol use: Not Currently    Alcohol/week: 6.0 standard drinks of alcohol    Types: 6 Standard drinks or equivalent per week    Comment: 1-2 drinks per day   Drug use: Never   Sexual activity: Yes  Other Topics Concern   Not on file  Social History Narrative   Not on file   Social Determinants of Health   Financial Resource Strain: Not on file  Food Insecurity: No Food Insecurity (07/26/2022)   Hunger Vital Sign    Worried About Running Out of Food in the Last Year: Never true    Ran Out of Food in the Last Year: Never true  Transportation Needs: No Transportation Needs (07/26/2022)   PRAPARE - Hydrologist (Medical): No    Lack of Transportation (Non-Medical): No  Physical Activity: Not on file  Stress: Not on file  Social Connections: Not on file  Intimate Partner Violence: Not At Risk (07/26/2022)   Humiliation, Afraid, Rape, and Kick questionnaire    Fear of Current or Ex-Partner: No    Emotionally Abused: No    Physically Abused: No    Sexually Abused: No     Constitutional: Patient reports unintentional weight loss.  Denies fever, malaise, headache.  HEENT: Denies eye pain, eye redness, ear pain, ringing in the ears, wax buildup, runny nose, nasal congestion, bloody nose, or sore throat. Respiratory: Denies difficulty breathing, shortness of breath, cough or sputum production.   Cardiovascular: Denies chest pain, chest tightness, palpitations or swelling in the hands or feet.  Gastrointestinal: Denies abdominal pain, bloating, constipation, diarrhea or blood in  the stool.  GU: Denies urgency, frequency, pain with urination, burning sensation, blood in urine, odor or discharge. Musculoskeletal: Patient reports generalized weakness, difficulty with gait.  Denies decrease in range of motion, muscle pain or joint pain and swelling.  Skin: Denies redness, rashes, lesions or ulcercations.  Neurological: Denies dizziness, difficulty with memory, difficulty with speech or problems with balance and coordination.  Psych: Denies anxiety, depression, SI/HI.  No other specific complaints in a complete review of systems (except as listed in HPI above).    Observations/Objective:   Wt Readings from Last 3 Encounters:  08/20/22 146 lb (66.2 kg)  08/17/22 147 lb 11.3 oz (67 kg)  08/09/22 154 lb 5.2 oz (70 kg)  General: In NAD. Pulmonary/Chest:  No respiratory distress.  Neurological: Alert and oriented.   BMET    Component Value Date/Time   NA 135 07/26/2022 1545   K 3.5 07/26/2022 1545   CL 98 07/26/2022 1545   CO2 23 07/26/2022 1545   GLUCOSE 97 07/26/2022 1545   BUN 9 07/26/2022 1545   CREATININE 0.69 07/26/2022 1545   CREATININE 0.76 12/23/2021 1332   CALCIUM 8.8 (L) 07/26/2022 1545   GFRNONAA >60 07/26/2022 1545    Lipid Panel  No results found for: "CHOL", "TRIG", "HDL", "CHOLHDL", "VLDL", "LDLCALC"  CBC    Component Value Date/Time   WBC 7.5 07/26/2022 1545   RBC 3.95 07/26/2022 1545   HGB 13.0 07/26/2022 1545   HCT 39.3 07/26/2022 1545   PLT 251 07/26/2022 1545   MCV 99.5 07/26/2022 1545   MCH 32.9 07/26/2022 1545   MCHC 33.1 07/26/2022 1545   RDW 13.3 07/26/2022 1545    Hgb A1C Lab Results  Component Value Date   HGBA1C 4.7 12/23/2021       Assessment and Plan:  Hospital Follow Up for MSSA bacteremia status post right humerus fracture, unintentional weight loss secondary to refeeding syndrome and alcoholic fatty liver disease:  Multiple hospital/ER, op notes reviewed FMLA forms will be completed She will  continue current medications as prescribed  Scheduled follow-up of chronic conditions  Follow Up Instructions:    I discussed the assessment and treatment plan with the patient. The patient was provided an opportunity to ask questions and all were answered. The patient agreed with the plan and demonstrated an understanding of the instructions.   The patient was advised to call back or seek an in-person evaluation if the symptoms worsen or if the condition fails to improve as anticipated.  I provided 12:38 minutes of non-face-to-face time during this encounter.   Webb Silversmith, NP

## 2022-10-23 DIAGNOSIS — M869 Osteomyelitis, unspecified: Secondary | ICD-10-CM | POA: Diagnosis not present

## 2022-10-23 DIAGNOSIS — T847XXD Infection and inflammatory reaction due to other internal orthopedic prosthetic devices, implants and grafts, subsequent encounter: Secondary | ICD-10-CM | POA: Diagnosis not present

## 2022-10-23 DIAGNOSIS — A4901 Methicillin susceptible Staphylococcus aureus infection, unspecified site: Secondary | ICD-10-CM | POA: Diagnosis not present

## 2022-10-23 DIAGNOSIS — Z9884 Bariatric surgery status: Secondary | ICD-10-CM | POA: Diagnosis not present

## 2022-10-23 DIAGNOSIS — Y831 Surgical operation with implant of artificial internal device as the cause of abnormal reaction of the patient, or of later complication, without mention of misadventure at the time of the procedure: Secondary | ICD-10-CM | POA: Diagnosis not present

## 2022-10-23 DIAGNOSIS — Z1331 Encounter for screening for depression: Secondary | ICD-10-CM | POA: Diagnosis not present

## 2022-10-23 DIAGNOSIS — Z792 Long term (current) use of antibiotics: Secondary | ICD-10-CM | POA: Diagnosis not present

## 2022-10-23 DIAGNOSIS — G629 Polyneuropathy, unspecified: Secondary | ICD-10-CM | POA: Diagnosis not present

## 2022-10-23 DIAGNOSIS — K760 Fatty (change of) liver, not elsewhere classified: Secondary | ICD-10-CM | POA: Diagnosis not present

## 2022-10-26 ENCOUNTER — Encounter: Payer: Self-pay | Admitting: Internal Medicine

## 2022-10-26 NOTE — Patient Instructions (Signed)
Refeeding Syndrome Refeeding syndrome is a serious medical condition that must be treated in a hospital setting. It can occur when a person is rapidly fed through oral or artificial nutrition after not taking in enough calories over a prolonged period of time. When this condition occurs, there is a shift in levels of salts and minerals in the blood (electrolytes) and fluids. This can lead to organ dysfunction and, if untreated, death. What are the causes? This condition is caused by prolonged inadequate oral intake, such as: Starvation. Food restriction. A medical condition that prevents you from eating or absorbing enough nutrients. This condition happens when food or nutrition therapy is reintroduced too quickly into the body, causing electrolyte imbalances. Electrolyte imbalances can cause organs not to function properly. What increases the risk? The following factors may make you more likely to develop this condition: Not being able to take in adequate nutrition due to ongoing swallowing difficulties, nausea, vomiting, or diarrhea. Eating disorders such as anorexia nervosa or severe restrictive dieting. Having a chronic medical condition such as cancer, heart failure, or respiratory disease. Having a drug or alcohol use disorder. Having a surgery that removes parts of your bowel or stomach. What are the signs or symptoms? Symptoms of this condition include: Breathing problems. Heart problems, including abnormal heart rhythms. Low blood pressure (hypotension). Diarrhea. Seizures. Coma. Fluid overload. How is this diagnosed? This condition may be diagnosed based on: Your medical history. Your diet history. A physical exam. Blood tests. How is this treated? Treatment depends on the cause and severity of your condition. Treatment often requires a stay in the hospital. It may include: Slowly and gradually reintroducing nutrition, either by mouth, tube feeding, or IV. Monitoring  electrolyte levels, such as potassium, magnesium, and phosphorus. Low electrolyte blood levels are supplemented once nutrition is reintroduced and advanced. For those with severe nutrition deficiencies such as chronic alcoholism or prolonged starvation, multivitamins, especially thiamin, are given. Follow these instructions at home:  Take dietary supplements only as told by your health care provider. Take over-the-counter and prescription medicines only as told by your health care provider. Monitor your food intake, weight, and other vital signs if your health care provider asks you to. Write down your observations. Keep all follow-up visits. This is important. Contact a health care provider if: You develop new symptoms. You have symptoms that return after treatment. You continue to have symptoms. You develop nausea or vomiting. You have a fever. Get help right away if: You have an increased heart rate. You are unable to eat or drink. You faint. You become disoriented or confused. Summary Refeeding syndrome is a serious medical condition that must be treated in a hospital setting. This condition is caused by electrolyte and fluid shifts that occur in the body when food or nutrients are rapidly reintroduced into the body. You are more likely to develop this condition if you are not getting enough calories and nutrients over time. This condition is treated by slowly and gradually reintroducing nutrition, either by mouth, tube feeding, or IV. This information is not intended to replace advice given to you by your health care provider. Make sure you discuss any questions you have with your health care provider. Document Revised: 08/11/2021 Document Reviewed: 08/11/2021 Elsevier Patient Education  [place].

## 2022-10-27 ENCOUNTER — Ambulatory Visit: Payer: BC Managed Care – PPO | Admitting: Internal Medicine

## 2022-10-27 ENCOUNTER — Encounter: Payer: Self-pay | Admitting: Internal Medicine

## 2022-10-27 VITALS — BP 104/60 | HR 116 | Temp 97.3°F | Wt 141.0 lb

## 2022-10-27 DIAGNOSIS — F419 Anxiety disorder, unspecified: Secondary | ICD-10-CM | POA: Diagnosis not present

## 2022-10-27 DIAGNOSIS — F32A Depression, unspecified: Secondary | ICD-10-CM

## 2022-10-27 DIAGNOSIS — I10 Essential (primary) hypertension: Secondary | ICD-10-CM

## 2022-10-27 DIAGNOSIS — M6259 Muscle wasting and atrophy, not elsewhere classified, multiple sites: Secondary | ICD-10-CM

## 2022-10-27 DIAGNOSIS — K9429 Other complications of gastrostomy: Secondary | ICD-10-CM

## 2022-10-27 DIAGNOSIS — K760 Fatty (change of) liver, not elsewhere classified: Secondary | ICD-10-CM

## 2022-10-27 DIAGNOSIS — K219 Gastro-esophageal reflux disease without esophagitis: Secondary | ICD-10-CM | POA: Diagnosis not present

## 2022-10-27 MED ORDER — ONDANSETRON 4 MG PO TBDP: 4.0000 mg | ORAL_TABLET | Freq: Three times a day (TID) | ORAL | 0 refills | Status: DC | PRN

## 2022-10-27 MED ORDER — GABAPENTIN 100 MG PO CAPS: 100.0000 mg | ORAL_CAPSULE | Freq: Every day | ORAL | 1 refills | Status: DC

## 2022-10-27 NOTE — Progress Notes (Unsigned)
Subjective:    Patient ID: Monica Leon, female    DOB: 1971-10-30, 51 y.o.   MRN: [id number]  HPI  Patient presents to clinic today for follow-up of chronic conditions.  HTN: Her BP today is 104/60.  She is not taking any antihypertensive medications at this time.  ECG from 08/2022 reviewed.  Alcoholic Fatty Liver Disease: Status post recent hospitalization for unintentional weight loss, refeeding syndrome.  She is abstaining from alcohol at this time.  She is taking Zofran, Metoclopramine and Omeprazole. She is following with GI.  Anxiety and Depression: Managed on Escitalopram. She is not current seeing a therapist but would like to see one. She denies SI/HI.  Muscle Wasting: Managed with Mirtazpine.   Review of Systems     Past Medical History:  Diagnosis Date   Anxiety    Depression    Fatty liver determined by biopsy    GERD (gastroesophageal reflux disease)    Hypertension    off meds 1 year   Migraine    Muscle atrophy of lower extremity    PONV (postoperative nausea and vomiting)    with ankle surgery   Unintentional weight loss 2023   80 pounds in a year-has no appetite    Current Outpatient Medications  Medication Sig Dispense Refill   escitalopram (LEXAPRO) 20 MG tablet Take 20 mg by mouth every morning.     ibuprofen (ADVIL) 600 MG tablet Take by mouth.     metFORMIN (GLUCOPHAGE) 500 MG tablet TAKE 1 TABLET(500 MG) BY MOUTH TWICE DAILY WITH A MEAL (Patient taking differently: Take 500 mg by mouth 2 (two) times daily.) 60 tablet 2   metFORMIN (GLUCOPHAGE) 500 MG tablet Take by mouth.     metoCLOPramide (REGLAN) 10 MG tablet Take 1 tablet (10 mg total) by mouth every 8 (eight) hours as needed for nausea. 30 tablet 0   mirtazapine (REMERON) 7.5 MG tablet TAKE 1 TABLET(7.5 MG) BY MOUTH AT BEDTIME 90 tablet 0   Multiple Vitamin (MULTIVITAMIN) tablet Take 1 tablet by mouth daily.     omeprazole (PRILOSEC) 40 MG capsule Take by mouth.     ondansetron  (ZOFRAN-ODT) 4 MG disintegrating tablet Take 1 tablet (4 mg total) by mouth every 8 (eight) hours as needed for nausea or vomiting. 30 tablet 0   oxyCODONE (ROXICODONE) 5 MG immediate release tablet Take 1-2 tablets (5-10 mg total) by mouth every 4 (four) hours as needed for severe pain or moderate pain. 40 tablet 0   No current facility-administered medications for this visit.    No Known Allergies  Family History  Problem Relation Age of Onset   Healthy Mother    Prostate cancer Father     Social History   Socioeconomic History   Marital status: Single    Spouse name: Not on file   Number of children: Not on file   Years of education: Not on file   Highest education level: Not on file  Occupational History   Not on file  Tobacco Use   Smoking status: Never   Smokeless tobacco: Never  Vaping Use   Vaping Use: Never used  Substance and Sexual Activity   Alcohol use: Not Currently    Alcohol/week: 6.0 standard drinks of alcohol    Types: 6 Standard drinks or equivalent per week    Comment: 1-2 drinks per day   Drug use: Never   Sexual activity: Yes  Other Topics Concern   Not on file  Social History  Narrative   Not on file   Social Determinants of Health   Financial Resource Strain: Not on file  Food Insecurity: No Food Insecurity (07/26/2022)   Hunger Vital Sign    Worried About Running Out of Food in the Last Year: Never true    Ran Out of Food in the Last Year: Never true  Transportation Needs: No Transportation Needs (07/26/2022)   PRAPARE - Hydrologist (Medical): No    Lack of Transportation (Non-Medical): No  Physical Activity: Not on file  Stress: Not on file  Social Connections: Not on file  Intimate Partner Violence: Not At Risk (07/26/2022)   Humiliation, Afraid, Rape, and Kick questionnaire    Fear of Current or Ex-Partner: No    Emotionally Abused: No    Physically Abused: No    Sexually Abused: No      Constitutional: Denies fever, malaise, fatigue, headache or abrupt weight changes.  HEENT: Denies eye pain, eye redness, ear pain, ringing in the ears, wax buildup, runny nose, nasal congestion, bloody nose, or sore throat. Respiratory: Denies difficulty breathing, shortness of breath, cough or sputum production.   Cardiovascular: Denies chest pain, chest tightness, palpitations or swelling in the hands or feet.  Gastrointestinal: Denies abdominal pain, bloating, constipation, diarrhea or blood in the stool.  GU: Denies urgency, frequency, pain with urination, burning sensation, blood in urine, odor or discharge. Musculoskeletal: Denies decrease in range of motion, difficulty with gait, muscle pain or joint pain and swelling.  Skin: Denies redness, rashes, lesions or ulcercations.  Neurological: Denies dizziness, difficulty with memory, difficulty with speech or problems with balance and coordination.  Psych: Patient has a history of anxiety and depression.  Denies SI/HI.  No other specific complaints in a complete review of systems (except as listed in HPI above).  Objective:   Physical Exam   There were no vitals taken for this visit. Wt Readings from Last 3 Encounters:  08/20/22 146 lb (66.2 kg)  08/17/22 147 lb 11.3 oz (67 kg)  08/09/22 154 lb 5.2 oz (70 kg)    General: Appears their stated age, well developed, well nourished in NAD. Skin: Warm, dry and intact. No rashes, lesions or ulcerations noted. HEENT: Head: normal shape and size; Eyes: sclera white, no icterus, conjunctiva pink, PERRLA and EOMs intact; Ears: Tm's gray and intact, normal light reflex; Nose: mucosa pink and moist, septum midline; Throat/Mouth: Teeth present, mucosa pink and moist, no exudate, lesions or ulcerations noted.  Neck:  Neck supple, trachea midline. No masses, lumps or thyromegaly present.  Cardiovascular: Normal rate and rhythm. S1,S2 noted.  No murmur, rubs or gallops noted. No JVD or BLE  edema. No carotid bruits noted. Pulmonary/Chest: Normal effort and positive vesicular breath sounds. No respiratory distress. No wheezes, rales or ronchi noted.  Abdomen: Soft and nontender. Normal bowel sounds. No distention or masses noted. Liver, spleen and kidneys non palpable. Musculoskeletal: Normal range of motion. No signs of joint swelling. No difficulty with gait.  Neurological: Alert and oriented. Cranial nerves II-XII grossly intact. Coordination normal.  Psychiatric: Mood and affect normal. Behavior is normal. Judgment and thought content normal.    BMET    Component Value Date/Time   NA 135 07/26/2022 1545   K 3.5 07/26/2022 1545   CL 98 07/26/2022 1545   CO2 23 07/26/2022 1545   GLUCOSE 97 07/26/2022 1545   BUN 9 07/26/2022 1545   CREATININE 0.69 07/26/2022 1545   CREATININE 0.76 12/23/2021 1332  CALCIUM 8.8 (L) 07/26/2022 1545   GFRNONAA >60 07/26/2022 1545    Lipid Panel  No results found for: "CHOL", "TRIG", "HDL", "CHOLHDL", "VLDL", "LDLCALC"  CBC    Component Value Date/Time   WBC 7.5 07/26/2022 1545   RBC 3.95 07/26/2022 1545   HGB 13.0 07/26/2022 1545   HCT 39.3 07/26/2022 1545   PLT 251 07/26/2022 1545   MCV 99.5 07/26/2022 1545   MCH 32.9 07/26/2022 1545   MCHC 33.1 07/26/2022 1545   RDW 13.3 07/26/2022 1545    Hgb A1C Lab Results  Component Value Date   HGBA1C 4.7 12/23/2021           Assessment & Plan:     RTC in 6 months for your annual exam Webb Silversmith, NP

## 2022-10-28 ENCOUNTER — Encounter: Payer: Self-pay | Admitting: Internal Medicine

## 2022-10-28 DIAGNOSIS — M6250 Muscle wasting and atrophy, not elsewhere classified, unspecified site: Secondary | ICD-10-CM | POA: Insufficient documentation

## 2022-10-28 NOTE — Assessment & Plan Note (Signed)
Continue mirtazapine for appetite stimulation

## 2022-10-28 NOTE — Assessment & Plan Note (Signed)
Controlled off meds We will monitor

## 2022-10-28 NOTE — Assessment & Plan Note (Signed)
Stable on escitalopram

## 2022-10-28 NOTE — Patient Instructions (Signed)
Fatty Liver Disease  The liver converts food into energy, removes toxic material from the blood, makes important proteins, and absorbs necessary vitamins from food. Fatty liver disease occurs when too much fat has built up in your liver cells. Fatty liver disease is also called hepatic steatosis. In many cases, fatty liver disease does not cause symptoms or problems. It is often diagnosed when tests are being done for other reasons. However, over time, fatty liver can cause inflammation that may lead to more serious liver problems, such as scarring of the liver (cirrhosis) and liver failure. Fatty liver is associated with insulin resistance, increased body fat, high blood pressure (hypertension), and high cholesterol. These are features of metabolic syndrome and increase your risk for stroke, diabetes, and heart disease. What are the causes? This condition may be caused by components of metabolic syndrome: Obesity. Insulin resistance. High cholesterol. Other causes: Alcohol abuse. Poor nutrition. Cushing syndrome. Pregnancy. Certain drugs. Poisons. Some viral infections. What increases the risk? You are more likely to develop this condition if you: Abuse alcohol. Are overweight. Have diabetes. Have hepatitis. Have a high triglyceride level. Are pregnant. What are the signs or symptoms? Fatty liver disease often does not cause symptoms. If symptoms do develop, they can include: Fatigue and weakness. Weight loss. Confusion. Nausea, vomiting, or abdominal pain. Yellowing of your skin and the white parts of your eyes (jaundice). Itchy skin. How is this diagnosed? This condition may be diagnosed by: A physical exam and your medical history. Blood tests. Imaging tests, such as an ultrasound, CT scan, or MRI. A liver biopsy. A small sample of liver tissue is removed using a needle. The sample is then looked at under a microscope. How is this treated? Fatty liver disease is often  caused by other health conditions. Treatment for fatty liver may involve medicines and lifestyle changes to manage conditions such as: Alcoholism. High cholesterol. Diabetes. Being overweight or obese. Follow these instructions at home:  Do not drink alcohol. If you have trouble quitting, ask your health care provider how to safely quit with the help of medicine or a supervised program. This is important to keep your condition from getting worse. Eat a healthy diet as told by your health care provider. Ask your health care provider about working with a dietitian to develop an eating plan. Exercise regularly. This can help you lose weight and control your cholesterol and diabetes. Talk to your health care provider about an exercise plan and which activities are best for you. Take over-the-counter and prescription medicines only as told by your health care provider. Keep all follow-up visits. This is important. Contact a health care provider if: You have trouble controlling your: Blood sugar. This is especially important if you have diabetes. Cholesterol. Drinking of alcohol. Get help right away if: You have abdominal pain. You have jaundice. You have nausea and are vomiting. You vomit blood or material that looks like coffee grounds. You have stools that are black, tar-like, or bloody. Summary Fatty liver disease develops when too much fat builds up in the cells of your liver. Fatty liver disease often causes no symptoms or problems. However, over time, fatty liver can cause inflammation that may lead to more serious liver problems, such as scarring of the liver (cirrhosis). You are more likely to develop this condition if you abuse alcohol, are pregnant, are overweight, have diabetes, have hepatitis, or have high triglyceride or cholesterol levels. Contact your health care provider if you have trouble controlling your blood  sugar, cholesterol, or drinking of alcohol. This information is  not intended to replace advice given to you by your health care provider. Make sure you discuss any questions you have with your health care provider. Document Revised: 08/08/2020 Document Reviewed: 08/08/2020 Elsevier Patient Education  [place].

## 2022-10-28 NOTE — Assessment & Plan Note (Signed)
Continue Zofran, Reglan and omeprazole She will continue to follow with GI

## 2022-10-28 NOTE — Assessment & Plan Note (Signed)
Continue omeprazole and Reglan

## 2022-11-04 ENCOUNTER — Telehealth: Payer: Self-pay | Admitting: Internal Medicine

## 2022-11-04 NOTE — Telephone Encounter (Signed)
Pt is calling back in stating that she is needing a referral to [facility] interventional radiology to have her feeding tube removed since they are the ones that placed it per [facility].

## 2022-11-04 NOTE — Telephone Encounter (Signed)
Pt is calling in wanting to see if she can get another DMV disability placard form due to her misplacing it and she also would like to know if it is okay for her to take prenatal vitamins to assist with her hair loss. Pt would like for you to call her boyfriends number and he will get her on the phone or will call her to let her know that you called and she will call the office back.  Pt stated that she is trying to get the Disability placard tomorrow if possible (place a copy on Regina's desk to fill out and sign.

## 2022-11-05 NOTE — Telephone Encounter (Signed)
Pt is calling back- Missed a call. Please advise CB- [phone]

## 2022-11-05 NOTE — Telephone Encounter (Signed)
Pt came to pick up the forms and also was given the answer of the questions that she was requesting while she was in the office.   [place] Placard Form was completed and copy was made for the chart per the provider. Prenatal Vitamins per the provider it is okay that she takes them. Referral to [facility] provider advised pt to speak with her GI doctor since they were the ones responsible for the feeding tube.  Pt stated that they (GI office) state that pts PCP need to put a referral in for removal (of the feeding tube).  Provider will consult with Dr. Parks Ranger and someone from the office will give the pt a call back once they have the answer.  Pt verbalized understanding and stated that she will wait on the call from the office but in the meantime she will call the GI department back to see if they can do it.  She stated that the February ?,2024 appointment w/the [facility] department is only for her Liver and not for the feeding tube.

## 2022-11-06 ENCOUNTER — Other Ambulatory Visit: Payer: Self-pay | Admitting: Internal Medicine

## 2022-11-06 DIAGNOSIS — Z431 Encounter for attention to gastrostomy: Secondary | ICD-10-CM

## 2022-11-06 NOTE — Telephone Encounter (Signed)
Tried calling the pt on both numbers that she has provided to [place] and one state the mailbox is full and is unable to leave an message and the other state that the mailbox has not been set-up.  Was trying to call the pt back to let her know that the pt will be receiving an call from a surgeon office Reeves Forth [place]) to get her scheduled within 1-2 weeks to do the removal of the feeding tube.

## 2022-11-06 NOTE — Telephone Encounter (Signed)
Pt called back and the information was given to her and she we super excited and wanted [place] to give the surgeons office this number to get in contact with her [phone].  Nothing further is needed from our office.

## 2022-11-12 DIAGNOSIS — Z931 Gastrostomy status: Secondary | ICD-10-CM | POA: Diagnosis not present

## 2022-11-24 ENCOUNTER — Other Ambulatory Visit: Payer: Self-pay | Admitting: Internal Medicine

## 2022-11-24 ENCOUNTER — Ambulatory Visit: Payer: Self-pay

## 2022-11-24 DIAGNOSIS — R29898 Other symptoms and signs involving the musculoskeletal system: Secondary | ICD-10-CM | POA: Diagnosis not present

## 2022-11-24 DIAGNOSIS — S42291S Other displaced fracture of upper end of right humerus, sequela: Secondary | ICD-10-CM | POA: Diagnosis not present

## 2022-11-24 DIAGNOSIS — M25511 Pain in right shoulder: Secondary | ICD-10-CM | POA: Diagnosis not present

## 2022-11-24 DIAGNOSIS — M25611 Stiffness of right shoulder, not elsewhere classified: Secondary | ICD-10-CM | POA: Diagnosis not present

## 2022-11-24 NOTE — Telephone Encounter (Signed)
  Chief Complaint: Medication refills needed Symptoms:  Frequency: Pertinent Negatives: Patient denies  Disposition: '[]'$ ED /'[]'$ Urgent Care (no appt availability in office) / '[]'$ Appointment(In office/virtual)/ '[]'$  [facility] [facility]/ '[]'$ Home Care/ '[]'$ Refused Recommended Disposition /'[]'$ [place] [facility]/ '[x]'$  Follow-up with PCP Additional Notes: Returned pt's call. Pt states that at last OV she mentioned to provider that 200 mg of Gabapentin controlled her symptoms. Per pt, provider agreed, but dosage was not increased. PT has been taking 2, '100mg'$  gabapentin tablets and has run out of medication. She cannot get more per insurance. Pt would like dosage increased to '200mg'$  and Rx called in. Pt also states that she needs rx's written by Provider for folic acid and Lexapro. Currently rx's are from provider at rehab.    Please advise.   Summary: Gabapentin dosage?   The patient called in requesting a change in dosage on her gabapentin (NEURONTIN) 100 MG capsule. She spoke previously to her provider and she said stated that the 200 mg work like a pro. Please assist patient further       Reason for Disposition  [1] Prescription refill request for ESSENTIAL medicine (i.e., likelihood of harm to patient if not taken) AND [2] triager unable to refill per department policy  Answer Assessment - Initial Assessment Questions 1. DRUG NAME: "What medicine do you need to have refilled?"     Gabapentin, Folic acid and Lexapro 2. REFILLS REMAINING: "How many refills are remaining?" (Note: The label on the medicine or pill bottle will show how many refills are remaining. If there are no refills remaining, then a renewal may be needed.)      3. EXPIRATION DATE: "What is the expiration date?" (Note: The label states when the prescription will expire, and thus can no longer be refilled.)      4. PRESCRIBING HCP: "Who prescribed it?" Reason: If prescribed by specialist, call should be referred to that group.       5. SYMPTOMS: "Do you have any symptoms?"      6. PREGNANCY: "Is there any chance that you are pregnant?" "When was your last menstrual period?"  Protocols used: Medication Refill and Renewal Call-A-AH

## 2022-11-24 NOTE — Telephone Encounter (Signed)
Medication Refill - Medication: escitalopram (LEXAPRO) 20 MG tablet   Has the patient contacted their pharmacy? Yes.     Preferred Pharmacy (with phone number or street name):  [facility], [place] [address] Phone: [phone]  Fax: [phone]     Has the patient been seen for an appointment in the last year OR does the patient have an upcoming appointment? Yes.    The patient called in and stated she was told her provider has denied the refill requests. She last got the meds at a rehab facility by Dr. Garnett Farm. in which she is no longer there. Please assist patient further as she is completely out of her Lexapro.

## 2022-11-24 NOTE — Telephone Encounter (Signed)
Requested medication (s) are due for refill today: Amount not specified  Requested medication (s) are on the active medication list: yes    Last refill: 08/18/21 Amount not specified  Future visit scheduled no  Notes to clinic:Historical provider, please review. Thank you.  Requested Prescriptions  Pending Prescriptions Disp Refills   escitalopram (LEXAPRO) 20 MG tablet      Sig: Take 1 tablet (20 mg total) by mouth every morning.     Psychiatry:  Antidepressants - SSRI Passed - 11/24/2022  2:17 PM      Passed - Completed PHQ-2 or PHQ-9 in the last 360 days      Passed - Valid encounter within last 6 months    Recent Outpatient Visits           4 weeks ago Primary hypertension   [place], [place], NP   1 month ago Closed supracondylar fracture of right humerus with delayed healing, subsequent encounter   [facility] [place], Coralie Keens, NP   3 months ago Gastroesophageal reflux disease without esophagitis   [facility] [place], Coralie Keens, NP   8 months ago Encounter for general adult medical examination with abnormal findings   [facility] [place], Coralie Keens, NP   9 months ago Chronic cough   [facility] [place], Coralie Keens, [place]

## 2022-11-25 MED ORDER — ESCITALOPRAM OXALATE 20 MG PO TABS: 20.0000 mg | ORAL_TABLET | ORAL | 0 refills | Status: DC

## 2022-11-25 MED ORDER — FOLIC ACID 1 MG PO TABS: 1.0000 mg | ORAL_TABLET | Freq: Every day | ORAL | 0 refills | Status: DC

## 2022-11-25 MED ORDER — GABAPENTIN 100 MG PO CAPS: 200.0000 mg | ORAL_CAPSULE | Freq: Every day | ORAL | 0 refills | Status: AC

## 2022-11-25 NOTE — Telephone Encounter (Signed)
Medications refilled

## 2022-11-25 NOTE — Addendum Note (Signed)
Addended by: Jearld Fenton on: 11/25/2022 07:51 AM   Modules accepted: Orders

## 2022-12-03 DIAGNOSIS — M25511 Pain in right shoulder: Secondary | ICD-10-CM | POA: Diagnosis not present

## 2022-12-03 DIAGNOSIS — S42291S Other displaced fracture of upper end of right humerus, sequela: Secondary | ICD-10-CM | POA: Diagnosis not present

## 2022-12-08 DIAGNOSIS — S42291S Other displaced fracture of upper end of right humerus, sequela: Secondary | ICD-10-CM | POA: Diagnosis not present

## 2022-12-08 DIAGNOSIS — M25511 Pain in right shoulder: Secondary | ICD-10-CM | POA: Diagnosis not present

## 2022-12-10 DIAGNOSIS — S42291S Other displaced fracture of upper end of right humerus, sequela: Secondary | ICD-10-CM | POA: Diagnosis not present

## 2022-12-10 DIAGNOSIS — M25511 Pain in right shoulder: Secondary | ICD-10-CM | POA: Diagnosis not present

## 2022-12-15 DIAGNOSIS — S42291S Other displaced fracture of upper end of right humerus, sequela: Secondary | ICD-10-CM | POA: Diagnosis not present

## 2022-12-15 DIAGNOSIS — M25511 Pain in right shoulder: Secondary | ICD-10-CM | POA: Diagnosis not present

## 2022-12-16 ENCOUNTER — Telehealth: Payer: Self-pay

## 2022-12-16 DIAGNOSIS — G629 Polyneuropathy, unspecified: Secondary | ICD-10-CM

## 2022-12-16 NOTE — Telephone Encounter (Unsigned)
Copied from [place] [id number]. Topic: Referral - Request for Referral >> Dec 16, 2022 11:42 AM Chapman Fitch wrote: Has patient seen PCP for this complaint? Yes  *If NO, is insurance requiring patient see PCP for this issue before PCP can refer them? Referral for which specialty: Neurology  Preferred provider/office: [facility] provider  Reason for referral: Neuropathy

## 2022-12-17 NOTE — Telephone Encounter (Signed)
Referral placed.

## 2022-12-17 NOTE — Addendum Note (Signed)
Addended by: Jearld Fenton on: 12/17/2022 08:53 AM   Modules accepted: Orders

## 2022-12-22 DIAGNOSIS — S42291S Other displaced fracture of upper end of right humerus, sequela: Secondary | ICD-10-CM | POA: Diagnosis not present

## 2022-12-22 DIAGNOSIS — M25511 Pain in right shoulder: Secondary | ICD-10-CM | POA: Diagnosis not present

## 2022-12-24 ENCOUNTER — Other Ambulatory Visit: Payer: Self-pay | Admitting: Surgery

## 2022-12-27 ENCOUNTER — Other Ambulatory Visit: Payer: Self-pay | Admitting: Internal Medicine

## 2022-12-28 DIAGNOSIS — K760 Fatty (change of) liver, not elsewhere classified: Secondary | ICD-10-CM | POA: Diagnosis not present

## 2022-12-28 NOTE — Telephone Encounter (Signed)
Requested medication (s) are due for refill today: yes  Requested medication (s) are on the active medication list: yes  Last refill:  08/20/22  Future visit scheduled: yes  Notes to clinic:  Unable to refill per protocol, last refill by another provider.      Requested Prescriptions  Pending Prescriptions Disp Refills   omeprazole (PRILOSEC) 40 MG capsule [Pharmacy Med Name: OMEPRAZOLE 40MG CAPSULES] 90 capsule     Sig: TAKE 1 CAPSULE(40 MG) BY MOUTH DAILY     Gastroenterology: Proton Pump Inhibitors Passed - 12/27/2022  9:46 AM      Passed - Valid encounter within last 12 months    Recent Outpatient Visits           2 months ago Primary hypertension   [facility] [place], [place] W, NP   2 months ago Closed supracondylar fracture of right humerus with delayed healing, subsequent encounter   [facility] [place], [place] W, NP   4 months ago Gastroesophageal reflux disease without esophagitis   [facility] [place], [place], NP   9 months ago Encounter for general adult medical examination with abnormal findings   [facility] [place], [place] Keens, NP   10 months ago Chronic cough   [facility] [place], [place] Keens, [place]

## 2022-12-29 DIAGNOSIS — M545 Low back pain, unspecified: Secondary | ICD-10-CM | POA: Diagnosis not present

## 2022-12-29 DIAGNOSIS — S42291S Other displaced fracture of upper end of right humerus, sequela: Secondary | ICD-10-CM | POA: Diagnosis not present

## 2022-12-30 ENCOUNTER — Inpatient Hospital Stay: Admission: RE | Admit: 2022-12-30 | Payer: BC Managed Care – PPO | Source: Ambulatory Visit

## 2022-12-30 DIAGNOSIS — M545 Low back pain, unspecified: Secondary | ICD-10-CM | POA: Diagnosis not present

## 2022-12-30 DIAGNOSIS — R200 Anesthesia of skin: Secondary | ICD-10-CM | POA: Diagnosis not present

## 2022-12-30 DIAGNOSIS — G629 Polyneuropathy, unspecified: Secondary | ICD-10-CM | POA: Diagnosis not present

## 2022-12-30 DIAGNOSIS — M79671 Pain in right foot: Secondary | ICD-10-CM | POA: Diagnosis not present

## 2023-01-01 ENCOUNTER — Encounter
Admission: RE | Admit: 2023-01-01 | Discharge: 2023-01-01 | Disposition: A | Discharge status: Home / Self Care | Payer: BC Managed Care – PPO | Source: Ambulatory Visit | Attending: Surgery | Admitting: Surgery

## 2023-01-01 ENCOUNTER — Other Ambulatory Visit: Payer: Self-pay

## 2023-01-01 DIAGNOSIS — T847XXD Infection and inflammatory reaction due to other internal orthopedic prosthetic devices, implants and grafts, subsequent encounter: Secondary | ICD-10-CM | POA: Diagnosis not present

## 2023-01-01 DIAGNOSIS — K760 Fatty (change of) liver, not elsewhere classified: Secondary | ICD-10-CM | POA: Diagnosis not present

## 2023-01-01 DIAGNOSIS — A4901 Methicillin susceptible Staphylococcus aureus infection, unspecified site: Secondary | ICD-10-CM | POA: Diagnosis not present

## 2023-01-01 DIAGNOSIS — M869 Osteomyelitis, unspecified: Secondary | ICD-10-CM | POA: Diagnosis not present

## 2023-01-01 DIAGNOSIS — Z79899 Other long term (current) drug therapy: Secondary | ICD-10-CM | POA: Diagnosis not present

## 2023-01-01 DIAGNOSIS — Z1331 Encounter for screening for depression: Secondary | ICD-10-CM | POA: Diagnosis not present

## 2023-01-01 DIAGNOSIS — W19XXXD Unspecified fall, subsequent encounter: Secondary | ICD-10-CM | POA: Diagnosis not present

## 2023-01-01 NOTE — Patient Instructions (Addendum)
Your procedure is scheduled on: 01/07/23 - Thursday Report to the Registration Desk on the 1st floor of the [facility]. To find out your arrival time, please call [phone] between 1PM - 3PM on: 01/06/23 - Wednesday If your arrival time is 6:00 am, do not arrive before that time as the [facility] entrance doors do not open until 6:00 am.  REMEMBER: Instructions that are not followed completely may result in serious medical risk, up to and including death; or upon the discretion of your surgeon and anesthesiologist your surgery may need to be rescheduled.  Do not eat food after midnight the night before surgery.  No gum chewing or hard candies.  You may however, drink CLEAR liquids up to 2 hours before you are scheduled to arrive for your surgery. Do not drink anything within 2 hours of your scheduled arrival time.  Clear liquids include: - water  - apple juice without pulp - gatorade (not RED colors) - black coffee or tea (Do NOT add milk or creamers to the coffee or tea) Do NOT drink anything that is not on this list.  In addition, your doctor has ordered for you to drink the provided:  Ensure Pre-Surgery Clear Carbohydrate Drink  Drinking this carbohydrate drink up to two hours before surgery helps to reduce insulin resistance and improve patient outcomes. Please complete drinking 2 hours before scheduled arrival time.  One week prior to surgery: Stop Anti-inflammatories (NSAIDS) such as Advil, Aleve, Ibuprofen, Motrin, Naproxen, Naprosyn and Aspirin based products such as Excedrin, Goody's Powder, BC Powder.  Stop ANY OVER THE COUNTER supplements until after surgery.Multiple Vitamin (MULTIVITAMIN) ,folic acid (FOLVITE)  .  You may however, continue to take Tylenol if needed for pain up until the day of surgery.   TAKE ONLY THESE MEDICATIONS THE MORNING OF SURGERY WITH A SIP OF WATER:  omeprazole (PRILOSEC) (take one the night before and one on the morning of surgery -  helps to prevent nausea after surgery.) escitalopram (LEXAPRO)   -- stop cefadroxil Sunday prior to surgery 01/03/23 per [facility].  -- after surgery, restart cefadroxil '1000mg'$  twice a day per [facility].  No Alcohol for 24 hours before or after surgery.  No Smoking including e-cigarettes for 24 hours before surgery.  No chewable tobacco products for at least 6 hours before surgery.  No nicotine patches on the day of surgery.  Do not use any "recreational" drugs for at least a week (preferably 2 weeks) before your surgery.  Please be advised that the combination of cocaine and anesthesia may have negative outcomes, up to and including death. If you test positive for cocaine, your surgery will be cancelled.  On the morning of surgery brush your teeth with toothpaste and water, you may rinse your mouth with mouthwash if you wish. Do not swallow any toothpaste or mouthwash.  Use CHG Soap or wipes as directed on instruction sheet.  Do not wear jewelry, make-up, hairpins, clips or nail polish.  Do not wear lotions, powders, or perfumes.   Do not shave body hair from the neck down 48 hours before surgery.  Contact lenses, hearing aids and dentures may not be worn into surgery.  Do not bring valuables to the hospital. [facility] is not responsible for any missing/lost belongings or valuables.   Notify your doctor if there is any change in your medical condition (cold, fever, infection).  Wear comfortable clothing (specific to your surgery type) to the hospital.  After surgery,  you can help prevent lung complications by doing breathing exercises.  Take deep breaths and cough every 1-2 hours. Your doctor may order a device called an Incentive Spirometer to help you take deep breaths. When coughing or sneezing, hold a pillow firmly against your incision with both hands. This is called "splinting." Doing this helps protect your incision. It also decreases belly  discomfort.  If you are being admitted to the hospital overnight, leave your suitcase in the car. After surgery it may be brought to your room.  In case of increased patient census, it may be necessary for you, the patient, to continue your postoperative care in the Same Day Surgery department.  If you are being discharged the day of surgery, you will not be allowed to drive home. You will need a responsible individual to drive you home and stay with you for 24 hours after surgery.   If you are taking public transportation, you will need to have a responsible individual with you.  Please call the Central Dept. at [phone] if you have any questions about these instructions.  Surgery Visitation Policy:  Patients undergoing a surgery or procedure may have two family members or support persons with them as long as the person is not COVID-19 positive or experiencing its symptoms.   Inpatient Visitation:    Visiting hours are 7 a.m. to 8 p.m. Up to four visitors are allowed at one time in a patient room. The visitors may rotate out with other people during the day. One designated support person (adult) may remain overnight.  Due to an increase in RSV and influenza rates and associated hospitalizations, children ages 49 and under will not be able to visit patients in [facility]. Masks continue to be strongly recommended.    Preparing for Surgery with CHLORHEXIDINE GLUCONATE (CHG) Soap  Chlorhexidine Gluconate (CHG) Soap  o An antiseptic cleaner that kills germs and bonds with the skin to continue killing germs even after washing  o Used for showering the night before surgery and morning of surgery  Before surgery, you can play an important role by reducing the number of germs on your skin.  CHG (Chlorhexidine gluconate) soap is an antiseptic cleanser which kills germs and bonds with the skin to continue killing germs even after washing.  Please do not  use if you have an allergy to CHG or antibacterial soaps. If your skin becomes reddened/irritated stop using the CHG.  1. Shower the NIGHT BEFORE SURGERY and the MORNING OF SURGERY with CHG soap.  2. If you choose to wash your hair, wash your hair first as usual with your normal shampoo.  3. After shampooing, rinse your hair and body thoroughly to remove the shampoo.  4. Use CHG as you would any other liquid soap. You can apply CHG directly to the skin and wash gently with a scrungie or a clean washcloth.  5. Apply the CHG soap to your body only from the neck down. Do not use on open wounds or open sores. Avoid contact with your eyes, ears, mouth, and genitals (private parts). Wash face and genitals (private parts) with your normal soap.  6. Wash thoroughly, paying special attention to the area where your surgery will be performed.  7. Thoroughly rinse your body with warm water.  8. Do not shower/wash with your normal soap after using and rinsing off the CHG soap.  9. Pat yourself dry with a clean towel.  10. Wear clean pajamas to bed  the night before surgery.  12. Place clean sheets on your bed the night of your first shower and do not sleep with pets.  13. Shower again with the CHG soap on the day of surgery prior to arriving at the hospital.  14. Do not apply any deodorants/lotions/powders.  15. Please wear clean clothes to the hospital. How to Use an Incentive Spirometer  An incentive spirometer is a tool that measures how well you are filling your lungs with each breath. Learning to take long, deep breaths using this tool can help you keep your lungs clear and active. This may help to reverse or lessen your chance of developing breathing (pulmonary) problems, especially infection. You may be asked to use a spirometer: After a surgery. If you have a lung problem or a history of smoking. After a long period of time when you have been unable to move or be active. If the  spirometer includes an indicator to show the highest number that you have reached, your health care provider or respiratory therapist will help you set a goal. Keep a log of your progress as told by your health care provider. What are the risks? Breathing too quickly may cause dizziness or cause you to pass out. Take your time so you do not get dizzy or light-headed. If you are in pain, you may need to take pain medicine before doing incentive spirometry. It is harder to take a deep breath if you are having pain. How to use your incentive spirometer  Sit up on the edge of your bed or on a chair. Hold the incentive spirometer so that it is in an upright position. Before you use the spirometer, breathe out normally. Place the mouthpiece in your mouth. Make sure your lips are closed tightly around it. Breathe in slowly and as deeply as you can through your mouth, causing the piston or the ball to rise toward the top of the chamber. Hold your breath for 3-5 seconds, or for as long as possible. If the spirometer includes a coach indicator, use this to guide you in breathing. Slow down your breathing if the indicator goes above the marked areas. Remove the mouthpiece from your mouth and breathe out normally. The piston or ball will return to the bottom of the chamber. Rest for a few seconds, then repeat the steps 10 or more times. Take your time and take a few normal breaths between deep breaths so that you do not get dizzy or light-headed. Do this every 1-2 hours when you are awake. If the spirometer includes a goal marker to show the highest number you have reached (best effort), use this as a goal to work toward during each repetition. After each set of 10 deep breaths, cough a few times. This will help to make sure that your lungs are clear. If you have an incision on your chest or abdomen from surgery, place a pillow or a rolled-up towel firmly against the incision when you cough. This can help to  reduce pain while taking deep breaths and coughing. General tips When you are able to get out of bed: Walk around often. Continue to take deep breaths and cough in order to clear your lungs. Keep using the incentive spirometer until your health care provider says it is okay to stop using it. If you have been in the hospital, you may be told to keep using the spirometer at home. Contact a health care provider if: You are having difficulty using  the spirometer. You have trouble using the spirometer as often as instructed. Your pain medicine is not giving enough relief for you to use the spirometer as told. You have a fever. Get help right away if: You develop shortness of breath. You develop a cough with bloody mucus from the lungs. You have fluid or blood coming from an incision site after you cough. Summary An incentive spirometer is a tool that can help you learn to take long, deep breaths to keep your lungs clear and active. You may be asked to use a spirometer after a surgery, if you have a lung problem or a history of smoking, or if you have been inactive for a long period of time. Use your incentive spirometer as instructed every 1-2 hours while you are awake. If you have an incision on your chest or abdomen, place a pillow or a rolled-up towel firmly against your incision when you cough. This will help to reduce pain. Get help right away if you have shortness of breath, you cough up bloody mucus, or blood comes from your incision when you cough. This information is not intended to replace advice given to you by your health care provider. Make sure you discuss any questions you have with your health care provider. Document Revised: 01/15/2020 Document Reviewed: 01/15/2020 Elsevier Patient Education  [employer].

## 2023-01-05 DIAGNOSIS — S42291S Other displaced fracture of upper end of right humerus, sequela: Secondary | ICD-10-CM | POA: Diagnosis not present

## 2023-01-05 DIAGNOSIS — M25511 Pain in right shoulder: Secondary | ICD-10-CM | POA: Diagnosis not present

## 2023-01-07 ENCOUNTER — Ambulatory Visit: Payer: BC Managed Care – PPO | Admitting: Urgent Care

## 2023-01-07 ENCOUNTER — Ambulatory Visit: Payer: BC Managed Care – PPO | Admitting: Anesthesiology

## 2023-01-07 ENCOUNTER — Ambulatory Visit: Payer: BC Managed Care – PPO

## 2023-01-07 ENCOUNTER — Other Ambulatory Visit: Payer: Self-pay

## 2023-01-07 ENCOUNTER — Ambulatory Visit
Admission: RE | Admit: 2023-01-07 | Discharge: 2023-01-07 | Disposition: A | Discharge status: Home / Self Care | Payer: BC Managed Care – PPO | Attending: Surgery | Admitting: Surgery

## 2023-01-07 ENCOUNTER — Encounter: Payer: Self-pay | Admitting: Surgery

## 2023-01-07 ENCOUNTER — Encounter
Admission: RE | Disposition: A | Discharge status: Home / Self Care | Payer: Self-pay | Source: Home / Self Care | Attending: Surgery

## 2023-01-07 DIAGNOSIS — Z9884 Bariatric surgery status: Secondary | ICD-10-CM | POA: Diagnosis not present

## 2023-01-07 DIAGNOSIS — S42291S Other displaced fracture of upper end of right humerus, sequela: Secondary | ICD-10-CM | POA: Diagnosis not present

## 2023-01-07 DIAGNOSIS — Y831 Surgical operation with implant of artificial internal device as the cause of abnormal reaction of the patient, or of later complication, without mention of misadventure at the time of the procedure: Secondary | ICD-10-CM | POA: Diagnosis not present

## 2023-01-07 DIAGNOSIS — G8918 Other acute postprocedural pain: Secondary | ICD-10-CM | POA: Diagnosis not present

## 2023-01-07 DIAGNOSIS — S42291A Other displaced fracture of upper end of right humerus, initial encounter for closed fracture: Secondary | ICD-10-CM | POA: Diagnosis not present

## 2023-01-07 DIAGNOSIS — S42201A Unspecified fracture of upper end of right humerus, initial encounter for closed fracture: Secondary | ICD-10-CM | POA: Diagnosis not present

## 2023-01-07 DIAGNOSIS — K219 Gastro-esophageal reflux disease without esophagitis: Secondary | ICD-10-CM | POA: Diagnosis not present

## 2023-01-07 DIAGNOSIS — I10 Essential (primary) hypertension: Secondary | ICD-10-CM | POA: Diagnosis not present

## 2023-01-07 DIAGNOSIS — T8450XD Infection and inflammatory reaction due to unspecified internal joint prosthesis, subsequent encounter: Secondary | ICD-10-CM | POA: Insufficient documentation

## 2023-01-07 DIAGNOSIS — F418 Other specified anxiety disorders: Secondary | ICD-10-CM | POA: Diagnosis not present

## 2023-01-07 DIAGNOSIS — T847XXA Infection and inflammatory reaction due to other internal orthopedic prosthetic devices, implants and grafts, initial encounter: Secondary | ICD-10-CM | POA: Diagnosis not present

## 2023-01-07 HISTORY — PX: HARDWARE REMOVAL: SHX979

## 2023-01-07 SURGERY — REMOVAL, HARDWARE
Anesthesia: General | Site: Shoulder | Laterality: Right

## 2023-01-07 MED ORDER — MIDAZOLAM HCL 2 MG/2ML IJ SOLN
INTRAMUSCULAR | Status: AC
  Filled 2023-01-07: qty 2

## 2023-01-07 MED ORDER — 0.9 % SODIUM CHLORIDE (POUR BTL) OPTIME
TOPICAL | Status: DC | PRN
  Administered 2023-01-07: 500 mL

## 2023-01-07 MED ORDER — BUPIVACAINE LIPOSOME 1.3 % IJ SUSP
INTRAMUSCULAR | Status: AC
  Filled 2023-01-07: qty 10

## 2023-01-07 MED ORDER — PHENYLEPHRINE HCL-NACL 20-0.9 MG/250ML-% IV SOLN
INTRAVENOUS | Status: AC
  Filled 2023-01-07: qty 250

## 2023-01-07 MED ORDER — MIDAZOLAM HCL 5 MG/5ML IJ SOLN
INTRAMUSCULAR | Status: DC | PRN
  Administered 2023-01-07: 2 mg via INTRAVENOUS

## 2023-01-07 MED ORDER — CHLORHEXIDINE GLUCONATE 0.12 % MT SOLN: 15.0000 mL | Freq: Once | OROMUCOSAL | Status: AC

## 2023-01-07 MED ORDER — LACTATED RINGERS IV SOLN: INTRAVENOUS | Status: DC

## 2023-01-07 MED ORDER — FENTANYL CITRATE PF 50 MCG/ML IJ SOSY
PREFILLED_SYRINGE | INTRAMUSCULAR | Status: AC
  Filled 2023-01-07: qty 1

## 2023-01-07 MED ORDER — BUPIVACAINE HCL (PF) 0.5 % IJ SOLN
INTRAMUSCULAR | Status: AC
  Filled 2023-01-07: qty 10

## 2023-01-07 MED ORDER — PROPOFOL 10 MG/ML IV BOLUS
INTRAVENOUS | Status: DC | PRN
  Administered 2023-01-07: 80 mg via INTRAVENOUS

## 2023-01-07 MED ORDER — ONDANSETRON HCL 4 MG/2ML IJ SOLN
INTRAMUSCULAR | Status: DC | PRN
  Administered 2023-01-07: 4 mg via INTRAVENOUS

## 2023-01-07 MED ORDER — DROPERIDOL 2.5 MG/ML IJ SOLN: 0.6250 mg | Freq: Once | INTRAMUSCULAR | Status: DC | PRN

## 2023-01-07 MED ORDER — KETAMINE HCL 50 MG/5ML IJ SOSY
PREFILLED_SYRINGE | INTRAMUSCULAR | Status: AC
  Filled 2023-01-07: qty 5

## 2023-01-07 MED ORDER — FENTANYL CITRATE (PF) 100 MCG/2ML IJ SOLN
INTRAMUSCULAR | Status: DC | PRN
  Administered 2023-01-07 (×4): 25 ug via INTRAVENOUS
  Administered 2023-01-07: 50 ug via INTRAVENOUS

## 2023-01-07 MED ORDER — VANCOMYCIN HCL 1000 MG IV SOLR
INTRAVENOUS | Status: AC
  Filled 2023-01-07: qty 20

## 2023-01-07 MED ORDER — METOCLOPRAMIDE HCL 5 MG/ML IJ SOLN: 5.0000 mg | Freq: Three times a day (TID) | INTRAMUSCULAR | Status: DC | PRN

## 2023-01-07 MED ORDER — ACETAMINOPHEN 500 MG PO TABS
1000.0000 mg | ORAL_TABLET | Freq: Once | ORAL | Status: AC
  Administered 2023-01-07: 1000 mg via ORAL

## 2023-01-07 MED ORDER — PROMETHAZINE HCL 25 MG/ML IJ SOLN: 6.2500 mg | INTRAMUSCULAR | Status: DC | PRN

## 2023-01-07 MED ORDER — PHENYLEPHRINE HCL-NACL 20-0.9 MG/250ML-% IV SOLN
INTRAVENOUS | Status: DC | PRN
  Administered 2023-01-07: 25 ug/min via INTRAVENOUS
  Administered 2023-01-07 (×2): 80 ug via INTRAVENOUS

## 2023-01-07 MED ORDER — CHLORHEXIDINE GLUCONATE 0.12 % MT SOLN
OROMUCOSAL | Status: AC
  Administered 2023-01-07: 15 mL via OROMUCOSAL
  Filled 2023-01-07: qty 15

## 2023-01-07 MED ORDER — GLYCOPYRROLATE 0.2 MG/ML IJ SOLN
INTRAMUSCULAR | Status: DC | PRN
  Administered 2023-01-07: .2 mg via INTRAVENOUS

## 2023-01-07 MED ORDER — OXYCODONE HCL 5 MG PO TABS
ORAL_TABLET | ORAL | Status: AC
  Administered 2023-01-07: 10 mg via ORAL
  Filled 2023-01-07: qty 2

## 2023-01-07 MED ORDER — KETOROLAC TROMETHAMINE 30 MG/ML IJ SOLN
INTRAMUSCULAR | Status: AC
  Filled 2023-01-07: qty 1

## 2023-01-07 MED ORDER — ACETAMINOPHEN 10 MG/ML IV SOLN: 1000.0000 mg | Freq: Once | INTRAVENOUS | Status: DC | PRN

## 2023-01-07 MED ORDER — OXYCODONE HCL 5 MG PO TABS: 5.0000 mg | ORAL_TABLET | ORAL | 0 refills | Status: DC | PRN

## 2023-01-07 MED ORDER — BUPIVACAINE HCL (PF) 0.5 % IJ SOLN
INTRAMUSCULAR | Status: AC
  Filled 2023-01-07: qty 30

## 2023-01-07 MED ORDER — ACETAMINOPHEN 500 MG PO TABS
ORAL_TABLET | ORAL | Status: AC
  Filled 2023-01-07: qty 2

## 2023-01-07 MED ORDER — ONDANSETRON HCL 4 MG/2ML IJ SOLN: 4.0000 mg | Freq: Four times a day (QID) | INTRAMUSCULAR | Status: DC | PRN

## 2023-01-07 MED ORDER — ORAL CARE MOUTH RINSE: 15.0000 mL | Freq: Once | OROMUCOSAL | Status: AC

## 2023-01-07 MED ORDER — OXYCODONE HCL 5 MG/5ML PO SOLN: 5.0000 mg | Freq: Once | ORAL | Status: DC | PRN

## 2023-01-07 MED ORDER — KETAMINE HCL 10 MG/ML IJ SOLN
INTRAMUSCULAR | Status: DC | PRN
  Administered 2023-01-07: 30 mg via INTRAVENOUS

## 2023-01-07 MED ORDER — ROCURONIUM BROMIDE 100 MG/10ML IV SOLN
INTRAVENOUS | Status: DC | PRN
  Administered 2023-01-07: 40 mg via INTRAVENOUS

## 2023-01-07 MED ORDER — BUPIVACAINE LIPOSOME 1.3 % IJ SUSP
INTRAMUSCULAR | Status: DC | PRN
  Administered 2023-01-07: 10 mL via PERINEURAL

## 2023-01-07 MED ORDER — OXYCODONE HCL 5 MG PO TABS: 5.0000 mg | ORAL_TABLET | Freq: Once | ORAL | Status: DC | PRN

## 2023-01-07 MED ORDER — CEFAZOLIN SODIUM-DEXTROSE 2-4 GM/100ML-% IV SOLN
INTRAVENOUS | Status: AC
  Filled 2023-01-07: qty 100

## 2023-01-07 MED ORDER — LIDOCAINE HCL (CARDIAC) PF 100 MG/5ML IV SOSY
PREFILLED_SYRINGE | INTRAVENOUS | Status: DC | PRN
  Administered 2023-01-07: 100 mg via INTRAVENOUS

## 2023-01-07 MED ORDER — FENTANYL CITRATE (PF) 100 MCG/2ML IJ SOLN
INTRAMUSCULAR | Status: AC
  Filled 2023-01-07: qty 2

## 2023-01-07 MED ORDER — FENTANYL CITRATE (PF) 100 MCG/2ML IJ SOLN
25.0000 ug | INTRAMUSCULAR | Status: DC | PRN
  Administered 2023-01-07: 50 ug via INTRAVENOUS

## 2023-01-07 MED ORDER — DEXAMETHASONE SODIUM PHOSPHATE 10 MG/ML IJ SOLN
INTRAMUSCULAR | Status: DC | PRN
  Administered 2023-01-07: 10 mg via INTRAVENOUS

## 2023-01-07 MED ORDER — VASOPRESSIN 20 UNIT/ML IV SOLN
INTRAVENOUS | Status: DC | PRN
  Administered 2023-01-07 (×2): 1 [IU] via INTRAVENOUS

## 2023-01-07 MED ORDER — METOCLOPRAMIDE HCL 10 MG PO TABS: 5.0000 mg | ORAL_TABLET | Freq: Three times a day (TID) | ORAL | Status: DC | PRN

## 2023-01-07 MED ORDER — FENTANYL CITRATE (PF) 100 MCG/2ML IJ SOLN
INTRAMUSCULAR | Status: AC
  Administered 2023-01-07: 50 ug via INTRAVENOUS
  Filled 2023-01-07: qty 2

## 2023-01-07 MED ORDER — PROMETHAZINE HCL 25 MG/ML IJ SOLN
INTRAMUSCULAR | Status: AC
  Administered 2023-01-07: 6.25 mg via INTRAVENOUS
  Filled 2023-01-07: qty 1

## 2023-01-07 MED ORDER — ONDANSETRON HCL 4 MG PO TABS: 4.0000 mg | ORAL_TABLET | Freq: Four times a day (QID) | ORAL | Status: DC | PRN

## 2023-01-07 MED ORDER — BUPIVACAINE HCL (PF) 0.5 % IJ SOLN
INTRAMUSCULAR | Status: DC | PRN
  Administered 2023-01-07: 10 mL via PERINEURAL

## 2023-01-07 MED ORDER — PHENYLEPHRINE HCL (PRESSORS) 10 MG/ML IV SOLN: INTRAVENOUS | Status: DC | PRN

## 2023-01-07 MED ORDER — SODIUM CHLORIDE 0.9 % IV SOLN: INTRAVENOUS | Status: DC

## 2023-01-07 MED ORDER — KETOROLAC TROMETHAMINE 30 MG/ML IJ SOLN
30.0000 mg | Freq: Once | INTRAMUSCULAR | Status: AC
  Administered 2023-01-07: 30 mg via INTRAVENOUS

## 2023-01-07 MED ORDER — CEFAZOLIN SODIUM-DEXTROSE 2-4 GM/100ML-% IV SOLN
2.0000 g | INTRAVENOUS | Status: AC
  Administered 2023-01-07: 2 g via INTRAVENOUS

## 2023-01-07 MED ORDER — OXYCODONE HCL 5 MG PO TABS: 5.0000 mg | ORAL_TABLET | ORAL | Status: DC | PRN

## 2023-01-07 SURGICAL SUPPLY — 48 items
APL PRP STRL LF DISP 70% ISPRP (MISCELLANEOUS) ×2
BLADE SURG SZ10 CARB STEEL (BLADE) ×1 IMPLANT
BNDG CMPR 5X4 CHSV STRCH STRL (GAUZE/BANDAGES/DRESSINGS)
BNDG COHESIVE 4X5 TAN STRL LF (GAUZE/BANDAGES/DRESSINGS) ×1 IMPLANT
BNDG ELASTIC 4X5.8 VLCR STR LF (GAUZE/BANDAGES/DRESSINGS) ×1 IMPLANT
BNDG ESMARCH 6 X 12 STRL LF (GAUZE/BANDAGES/DRESSINGS) ×1
BNDG ESMARCH 6X12 STRL LF (GAUZE/BANDAGES/DRESSINGS) ×1 IMPLANT
CHLORAPREP W/TINT 26 (MISCELLANEOUS) ×2 IMPLANT
CUFF TOURN SGL QUICK 18X4 (TOURNIQUET CUFF) IMPLANT
CUFF TOURN SGL QUICK 24 (TOURNIQUET CUFF)
CUFF TRNQT CYL 24X4X16.5-23 (TOURNIQUET CUFF) IMPLANT
DRAPE C-ARM XRAY 36X54 (DRAPES) IMPLANT
DRAPE FLUOR MINI C-ARM 54X84 (DRAPES) ×1 IMPLANT
DRAPE INCISE IOBAN 66X45 STRL (DRAPES) ×1 IMPLANT
DRAPE U-SHAPE 47X51 STRL (DRAPES) ×1 IMPLANT
DRSG OPSITE POSTOP 4X8 (GAUZE/BANDAGES/DRESSINGS) IMPLANT
ELECT CAUTERY BLADE 6.4 (BLADE) ×1 IMPLANT
ELECT REM PT RETURN 9FT ADLT (ELECTROSURGICAL) ×1
ELECTRODE REM PT RTRN 9FT ADLT (ELECTROSURGICAL) ×1 IMPLANT
GAUZE SPONGE 4X4 12PLY STRL (GAUZE/BANDAGES/DRESSINGS) ×1 IMPLANT
GAUZE XEROFORM 1X8 LF (GAUZE/BANDAGES/DRESSINGS) ×1 IMPLANT
GLOVE BIO SURGEON STRL SZ8 (GLOVE) ×2 IMPLANT
GLOVE SURG UNDER LTX SZ8 (GLOVE) ×1 IMPLANT
GOWN STRL REUS W/ TWL LRG LVL3 (GOWN DISPOSABLE) ×2 IMPLANT
GOWN STRL REUS W/ TWL XL LVL3 (GOWN DISPOSABLE) ×1 IMPLANT
GOWN STRL REUS W/TWL LRG LVL3 (GOWN DISPOSABLE) ×2
GOWN STRL REUS W/TWL XL LVL3 (GOWN DISPOSABLE) ×1
KIT TURNOVER KIT A (KITS) ×1 IMPLANT
MANIFOLD NEPTUNE II (INSTRUMENTS) ×1 IMPLANT
MASK FACE SPIDER DISP (MASK) IMPLANT
NDL FILTER BLUNT 18X1 1/2 (NEEDLE) ×1 IMPLANT
NEEDLE FILTER BLUNT 18X1 1/2 (NEEDLE) ×1 IMPLANT
NS IRRIG 1000ML POUR BTL (IV SOLUTION) ×1 IMPLANT
NS IRRIG 500ML POUR BTL (IV SOLUTION) IMPLANT
PACK EXTREMITY ARMC (MISCELLANEOUS) ×1 IMPLANT
SLING ARM M TX990204 (SOFTGOODS) IMPLANT
STAPLER SKIN PROX 35W (STAPLE) ×1 IMPLANT
STOCKINETTE M/LG 89821 (MISCELLANEOUS) ×1 IMPLANT
SUT PROLENE 4 0 PS 2 18 (SUTURE) ×2 IMPLANT
SUT VIC AB 0 CT1 36 (SUTURE) IMPLANT
SUT VIC AB 2-0 CT1 27 (SUTURE) ×2
SUT VIC AB 2-0 CT1 TAPERPNT 27 (SUTURE) IMPLANT
SUT VIC AB 2-0 SH 27 (SUTURE) ×1
SUT VIC AB 2-0 SH 27XBRD (SUTURE) ×2 IMPLANT
SWAB CULTURE AMIES ANAERIB BLU (MISCELLANEOUS) IMPLANT
SYR 10ML LL (SYRINGE) ×1 IMPLANT
TRAP FLUID SMOKE EVACUATOR (MISCELLANEOUS) ×1 IMPLANT
WATER STERILE IRR 500ML POUR (IV SOLUTION) ×1 IMPLANT

## 2023-01-07 NOTE — H&P (Signed)
History of Present Illness:  Monica Leon is a 52 y.o. female who presents for follow-up now 10 weeks status post an open reduction and internal fixation of her right proximal humerus fracture which was complicated by postoperative wound infection that necessitated several formal open I&D's at [facility]. The patient notes that she is doing quite well at this time. She denies any pain in the shoulder and is not taking any medications for pain at this time. However, she is on chronic suppressive antibiotics as prescribed by the infectious disease expert at [facility]. She has been advised that she will need to be on antibiotics for the rest of her life unless the hardware is removed. Therefore she would like to discuss hardware removal. The patient has resumed most of her normal daily activities without difficulty and is sleeping well at night. She denies any reinjury to the shoulder, and denies any fevers or chills. She has not yet started any formal physical therapy. She has been out of work since her injury, but is anxious to get back to work in the near future. She works primarily on a computer from home.  Current Outpatient Medications: ACETAMINOPHEN ORAL Take by mouth  cefadroxil (DURICEF) 1 gram tablet Take 1 tablet (1 g total) by mouth 2 (two) times daily for 30 days 60 tablet 0  [START ON 11/17/2022] cefadroxil (DURICEF) 500 mg capsule Take 1 capsule (500 mg total) by mouth 2 (two) times daily for 60 days 60 capsule 1  escitalopram oxalate (LEXAPRO) 20 MG tablet Take 1 tablet (20 mg total) by G tube once daily  folic acid (FOLVITE) 1 MG tablet Take 1 tablet (1 mg total) by G tube once daily  lactobacillus rhamnosus, GG, (CULTURELLE) 10 billion cell capsule Take 1 capsule by G tube once daily  mirtazapine (REMERON) 7.5 MG tablet Take 1 tablet (7.5 mg total) by G tube at bedtime  multivitamin tablet Take 1 tablet by G tube once daily  nutritional supplement (OSMOLITE 1.5 CAL ORAL) Take 237 mLs by mouth at  bedtime For malnutrition  ondansetron (ZOFRAN-ODT) 4 MG disintegrating tablet Take 4 mg by mouth every 8 (eight) hours as needed for Nausea or Vomiting  thiamine (VITAMIN B-1) 100 MG tablet Take 1 tablet (100 mg total) by G tube once daily   Allergies: No Known Allergies  Past Medical History:  Alcohol dependence (CMS-HCC) 2023  resolved in 2023  Anxiety  Depression  GERD (gastroesophageal reflux disease)  Hardware complicating wound infection (CMS-HCC) 2023  R shoulder, MSSA  History of esophageal stricture  History of gastric bypass  Hypertension  Malnutrition (CMS-HCC) 2023  severe nutritional deficiencies leading to encephalopathy, peripheral neuropathy hospitalization 2023  Migraine  MSSA bacteremia 2023  secondary from R shoulder hardware infection  Muscle atrophy of lower extremity   Past Surgical History:  GASTRIC BYPASS OPEN 2008  COLONOSCOPY WITH PROPOFOL 03/23/2022  ESOPHAGOGASTRODUENOSCOPY 03/23/2022  EGD 04/29/2022  EGD 07/28/2022  Open reduction and internal fixation of closed comminuted two-part displaced surgical neck fracture, right proximal humerus Right 08/20/2022  Dr.Brytni Dray  REMOVAL HARDWARE SHOULDER Right 09/01/2022  Procedure: REMOVAL OF IMPLANT; SHOULDER, DEEP (EG, BURIED WIRE, PIN, SCREW, METAL BAND, NAIL, ROD OR PLATE); Surgeon: Creig Hines, MD; Location: [place]; Service: Orthopedics; Laterality: Right;  ARTHROTOMY SHOULDER ACROMIOCLAVICULAR/STERNOCLAVICULAR JOINT Right 09/01/2022  Procedure: ARTHROTOMY, GLENOHUMERAL JOINT, INCLUDING EXPLORATION, DRAINAGE, OR REMOVAL OF FOREIGN BODY; Surgeon: Creig Hines, MD; Location: [place]; Service: Orthopedics; Laterality: Right;  INSERTION NON-BIODEGRADABLE DRUG DELIVERY IMPLANT Right 09/01/2022  Procedure: INSERTION, bioresorbable, biodegradable, SHOULDER, NON-BIODEGRADABLE DRUG DELIVERY IMPLANT; Surgeon: Creig Hines, MD; Location: [place]; Service:  Orthopedics; Laterality: Right;  SECONDARY CLOSURE SURGICAL WOUND ARM Right 09/01/2022  Procedure: SECONDARY CLOSURE OF SURGICAL WOUND OR DEHISCENCE, EXTENSIVE OR COMPLICATED; ARM; Surgeon: Creig Hines, MD; Location: [place]; Service: Orthopedics; Laterality: Right;  DEBRIDEMENT SHOULDER Right 09/03/2022  Procedure: DEBRIDEMENT, SHOULDER; BONE (INCLUDES EPIDERMIS, DERMIS, SUBCUTANEOUS TISSUE,MUSCLE AND/OR FASCIA, IF PERFORMED); FIRST 20 SQ CM OR LESS; Surgeon: Dorian Pod, MD; Location: [place]; Service: Orthopedics; Laterality: Right;  DEBRIDEMENT SHOULDER Right 09/05/2022  Procedure: DEBRIDEMENT, SHOULDER; BONE (INCLUDES EPIDERMIS, DERMIS, SUBCUTANEOUS TISSUE,MUSCLE AND/OR FASCIA, IF PERFORMED); FIRST 20 SQ CM OR LESS; Surgeon: Anderson [place], MD; Location: [place]; Service: Orthopedics; Laterality: Right;  DEBRIDEMENT SHOULDER Right 09/09/2022  Procedure: DEBRIDEMENT, SHOULDER; BONE (INCLUDES EPIDERMIS, DERMIS, SUBCUTANEOUS TISSUE,MUSCLE AND/OR FASCIA, IF PERFORMED); FIRST 20 SQ CM OR LESS; Surgeon: Creig Hines, MD; Location: [place]; Service: Orthopedics; Laterality: Right;   Family History:  No Known Problems Mother  Prostate cancer Father   Social History:   Socioeconomic History:  Marital status: Single  Tobacco Use  Smoking status: Never  Smokeless tobacco: Never  Vaping Use  Vaping Use: Never used  Substance and Sexual Activity  Alcohol use: Yes  Drug use: Never   Social Determinants of Health:   Emergency planning/management officer Strain: Low Risk (10/13/2022)  Overall Financial Resource Strain (CARDIA)  Difficulty of Paying Living Expenses: Not hard at all  Food Insecurity: No Food Insecurity (10/13/2022)  Hunger Vital Sign  Worried About Running Out of Food in the Last Year: Never true  Ran Out of Food in the Last Year: Never true  Transportation Needs: No Transportation Needs (10/13/2022)  PRAPARE - Regulatory affairs officer (Medical): No  Lack of Transportation (Non-Medical): No   Review of Systems:  A comprehensive 14 point ROS was performed, reviewed, and the pertinent orthopaedic findings are documented in the HPI.  Physical Exam: Vitals:  11/04/22 1129  BP: 105/71  Pulse: (!) 123  Weight: 63.1 kg (139 lb 1.8 oz)  Height: 162.6 cm ('5\' 4"'$ )  PainSc: 1  PainLoc: Shoulder   General/Constitutional: The patient appears to be well-nourished, well-developed, and in no acute distress. Neuro/Psych: Normal mood and affect, oriented to person, place and time. Abdomen: Soft, non tender, non distended, Bowel sounds present.  Heart: Examination of the heart reveals regular, rate, and rhythm. There is no murmur noted on ascultation. There is a normal apical pulse.  Lungs: Lungs are clear to auscultation. There is no wheeze, rhonchi, or crackles. There is normal expansion of bilateral chest walls.  Right shoulder exam: On inspection, her surgical incision is well-healed and without evidence for infection. No swelling, erythema, ecchymosis and abrasions, or other skin abnormalities are identified. There is no tenderness to palpation over the anterior or lateral aspects of the shoulder. Actively, she can forward flex to 135 degrees, abduct to 130 degrees, and internally rotate to L4. She is able to reach the top of her head, the back of her neck, and across to the opposite shoulder, all without pain. She demonstrates 4/5 strength with resisted internal and external rotation, as well as with resisted forward flexion and abduction. She remains neurovascular intact to the right upper extremity and hand.  Assessment: 1. Status post ORIF of closed displaced right proximal humerus fracture. 2. Status post multiple I&D's for postoperative wound infection.  Plan: The treatment options were discussed with the  patient. In addition, patient educational materials were provided regarding the diagnosis and treatment  options. Overall, the patient is satisfied with her progress to this point. I have recommended physical therapy and a home exercise program in order to optimize her range of motion and strength following this injury and subsequent surgery. She may progress in her activities as symptoms permit, but is to avoid offending activities. She may take over-the-counter medications as needed for discomfort.  The patient would like to proceed with hardware removal in the not-too-distant future. I would like to have the hardware in for 4 to 5 months before removing it in order to be sure the bone is fully healed. The procedure was discussed with the patient, as were the potential risks (including bleeding, infection, nerve and/or blood vessel injury, persistent or recurrent pain, stiffness, residual strength deficits, refracture, need for further surgery, blood clots, strokes, heart attacks and/or arhythmias, pneumonia, etc.) and benefits. The patient states her understanding and wishes to proceed. All of the patient's questions and concerns were answered. She can call any time with further concerns. She will follow up post-surgery, routine.    H&P reviewed and patient re-examined. No changes.

## 2023-01-07 NOTE — Transfer of Care (Signed)
Immediate Anesthesia Transfer of Care Note  Patient: Monica Leon  Procedure(s) Performed: HARDWARE REMOVAL (Right: Shoulder)  Patient Location: PACU  Anesthesia Type:General  Level of Consciousness: awake, alert , and oriented  Airway & Oxygen Therapy: Patient Spontanous Breathing and Patient connected to nasal cannula oxygen  Post-op Assessment: Report given to RN and Post -op Vital signs reviewed and stable  Post vital signs: Reviewed and stable  Last Vitals:  Vitals Value Taken Time  BP 112/59 01/07/23 1531  Temp 36.6 C 01/07/23 1530  Pulse 90 01/07/23 1535  Resp 17 01/07/23 1535  SpO2 93 % 01/07/23 1535  Vitals shown include unvalidated device data.  Last Pain:  Vitals:   01/07/23 1234  TempSrc: Temporal  PainSc: 0-No pain         Complications: No notable events documented.

## 2023-01-07 NOTE — Anesthesia Procedure Notes (Signed)
Anesthesia Regional Block: Interscalene brachial plexus block   Pre-Anesthetic Checklist: , timeout performed,  Correct Patient, Correct Site, Correct Laterality,  Correct Procedure, Correct Position, site marked,  Risks and benefits discussed,  Surgical consent,  Pre-op evaluation,  At surgeon's request and post-op pain management  Laterality: Right and Upper  Prep: chloraprep       Needles:  Injection technique: Single-shot  Needle Type: Stimiplex     Needle Length: 9cm  Needle Gauge: 22     Additional Needles:   Procedures:,,,, ultrasound used (permanent image in chart),,    Narrative:  Start time: 01/07/2023 1:01 PM End time: 01/07/2023 1:05 PM Injection made incrementally with aspirations every 5 mL.  Performed by: Personally  Anesthesiologist: Iran Ouch, MD  Additional Notes: Patient consented for risk and benefits of nerve block including but not limited to nerve damage, failed block, bleeding and infection.  Patient voiced understanding.  Functioning IV was confirmed and monitors were applied.  Timeout done prior to procedure and prior to any sedation being given to the patient.  Patient confirmed procedure site prior to any sedation given to the patient. Sterile prep,hand hygiene and sterile gloves were used.  Minimal sedation used for procedure.  No paresthesia endorsed by patient during the procedure.  Negative aspiration and negative test dose prior to incremental administration of local anesthetic. The patient tolerated the procedure well with no immediate complications.

## 2023-01-07 NOTE — Op Note (Signed)
01/07/2023  3:31 PM  Patient:   Monica Leon  Pre-Op Diagnosis:   Retained surgical hardware status post I&D for postoperative wound infection status post ORIF right proximal humerus fracture.  Post-Op Diagnosis:   Same.  Procedure:   Hardware removal right proximal humerus.  Surgeon:   Pascal Lux, MD  Assistant:   Desiree Hane, RNFA  Anesthesia:   General endotracheal with an interscalene block using Exparel placed preoperatively by the anesthesiologist.  Findings:   As above.  Complications:   None  EBL:   75 cc  Fluids:   700 cc crystalloid  UOP:   None  TT:   None  Drains:   None  Closure:   Staples  Brief Clinical Note:   The patient is a 52 year old female who is now nearly 5 months status post an ORIF of a right proximal humerus fracture. Her postoperative course was complicated by a postoperative wound infection requiring several open irrigation and debridements performed at [facility]. Her infection appears to have cleared and her fracture has gone on to heal well. However, her infectious disease provider insists that her hardware be removed before she can come off her chronic oral antibiotics. The patient presents at this time for hardware removal from her right shoulder.  Procedure:   The patient underwent placement of an interscalene block using Exparel by the anesthesiologist in the preoperative holding area before being brought into the operating room and lain in the supine position. The patient then underwent general endotracheal intubation and anesthesia before the patient was repositioned in the beach chair position using the beach chair positioner. The right shoulder and upper extremity were prepped with ChloraPrep solution before being draped sterilely, utilizing a Spider to help hold the arm. Preoperative antibiotics were administered.   A timeout was performed to verify the appropriate surgical site before a standard anterior approach to  the shoulder was made utilizing the previous incision. The old scar was ellipsed out to optimize the appearance of the new incision. The incision was carried down through the subcutaneous tissues to expose the deltopectoral fascia. The interval between the deltoid and pectoralis muscles was identified and this plane developed. The conjoined tendon was identified. Its lateral margin was dissected and the Kolbel self-retraining retractor inserted.   The deltoid muscle was elevated laterally to expose the hardware. A culture swab was obtained. In addition, numerous tissue samples were obtained as well and sent for culture and sensitivity. The screws were removed from the plate sequentially using the appropriate screwdriver before the plate itself was removed. The fibrotic rind beneath the plate was scraped off using a small key elevator and also sent for culture. A single spot view of the shoulder was obtained using the FluoroScan unit to be sure that the fracture had indeed healed and that there is no longer was any retained metal.  The wound was copiously irrigated with sterile saline solution using the jet lavage system. The deltopectoral interval was closed using #0 Vicryl interrupted sutures before the subcutaneous tissues were closed using 2-0 Vicryl interrupted sutures. The skin was closed using staples. A sterile occlusive dressing was applied to the wound before the arm was placed into a shoulder sling. The patient was then transferred back to a hospital bed before being awakened, extubated, and returned to the recovery room in satisfactory condition after tolerating the procedure well.

## 2023-01-07 NOTE — Anesthesia Procedure Notes (Signed)
Procedure Name: Intubation Date/Time: 01/07/2023 1:59 PM  Performed by: Patience Musca., CRNAPre-anesthesia Checklist: Patient identified, Patient being monitored, Timeout performed, Emergency Drugs available and Suction available Patient Re-evaluated:Patient Re-evaluated prior to induction Oxygen Delivery Method: Circle system utilized Preoxygenation: Pre-oxygenation with 100% oxygen Induction Type: IV induction Ventilation: Mask ventilation without difficulty Laryngoscope Size: 3 and McGraph Grade View: Grade I Tube type: Oral Tube size: 6.5 mm Number of attempts: 1 Airway Equipment and Method: Stylet Placement Confirmation: ETT inserted through vocal cords under direct vision, positive ETCO2 and breath sounds checked- equal and bilateral Secured at: 20 cm Tube secured with: Tape Dental Injury: Teeth and Oropharynx as per pre-operative assessment

## 2023-01-07 NOTE — Anesthesia Preprocedure Evaluation (Addendum)
Anesthesia Evaluation  Patient identified by MRN, date of birth, ID band Patient awake    Reviewed: Allergy & Precautions, NPO status , Patient's Chart, lab work & pertinent test results  History of Anesthesia Complications (+) PONV and history of anesthetic complications  Airway Mallampati: III  TM Distance: <3 FB Neck ROM: full    Dental  (+) Chipped   Pulmonary neg pulmonary ROS, neg shortness of breath          Cardiovascular Exercise Tolerance: Good hypertension, (-) angina (-) Past MI Normal cardiovascular exam     Neuro/Psych  Headaches PSYCHIATRIC DISORDERS       Neuromuscular disease    GI/Hepatic ,GERD  Controlled,,(+)     substance abuse  alcohol useNASH Roux-en-Y (2007) Recent unintentional weight loss   Endo/Other  negative endocrine ROS    Renal/GU      Musculoskeletal   Abdominal Normal abdominal exam  (+)   Peds  Hematology negative hematology ROS (+)   Anesthesia Other Findings   Past Medical History: No date: Anxiety No date: Depression No date: Fatty liver determined by biopsy No date: GERD (gastroesophageal reflux disease) No date: Hypertension     Comment:  off meds 1 year No date: Migraine No date: Muscle atrophy of lower extremity No date: PONV (postoperative nausea and vomiting)     Comment:  with ankle surgery 2023: Unintentional weight loss     Comment:  80 pounds in a year-has no appetite  Past Surgical History: 2005: ANKLE FRACTURE SURGERY; Left No date: CARDIAC CATHETERIZATION 03/23/2022: COLONOSCOPY WITH PROPOFOL; N/A     Comment:  Procedure: COLONOSCOPY WITH PROPOFOL;  Surgeon: Lin Landsman, MD;  Location: [facility] ENDOSCOPY;  Service:               Gastroenterology;  Laterality: N/A; 03/23/2022: ESOPHAGOGASTRODUODENOSCOPY; N/A     Comment:  Procedure: ESOPHAGOGASTRODUODENOSCOPY (EGD);  Surgeon:               Lin Landsman, MD;  Location: [facility]  ENDOSCOPY;                Service: Gastroenterology;  Laterality: N/A; 04/29/2022: ESOPHAGOGASTRODUODENOSCOPY (EGD) WITH PROPOFOL; N/A     Comment:  Procedure: ESOPHAGOGASTRODUODENOSCOPY (EGD) WITH               PROPOFOL;  Surgeon: Lin Landsman, MD;  Location:               [facility] ENDOSCOPY;  Service: Gastroenterology;  Laterality:               N/A; 07/28/2022: ESOPHAGOGASTRODUODENOSCOPY (EGD) WITH PROPOFOL; N/A     Comment:  Procedure: ESOPHAGOGASTRODUODENOSCOPY (EGD) WITH               PROPOFOL;  Surgeon: Lin Landsman, MD;  Location:               [facility] ENDOSCOPY;  Service: Gastroenterology;  Laterality:               N/A; 2008: GASTRIC BYPASS  BMI    Body Mass Index: 25.06 kg/m      Reproductive/Obstetrics negative OB ROS                             Anesthesia Physical Anesthesia Plan  ASA: 3  Anesthesia Plan: General ETT   Post-op Pain Management: Regional  block*   Induction: Intravenous  PONV Risk Score and Plan: Ondansetron, Dexamethasone, Midazolam and Treatment may vary due to age or medical condition  Airway Management Planned: Oral ETT and Video Laryngoscope Planned  Additional Equipment:   Intra-op Plan:   Post-operative Plan: Extubation in OR  Informed Consent: I have reviewed the patients History and Physical, chart, labs and discussed the procedure including the risks, benefits and alternatives for the proposed anesthesia with the patient or authorized representative who has indicated his/her understanding and acceptance.     Dental Advisory Given  Plan Discussed with: Anesthesiologist, CRNA and Surgeon  Anesthesia Plan Comments: (Patient consented for risks of anesthesia including but not limited to:  - adverse reactions to medications - damage to eyes, teeth, lips or other oral mucosa - nerve damage due to positioning  - sore throat or hoarseness - Damage to heart, brain, nerves, lungs, other parts of body or  loss of life  Patient voiced understanding.)        Anesthesia Quick Evaluation

## 2023-01-07 NOTE — Discharge Instructions (Addendum)
Orthopedic discharge instructions: Wear shoulder immobilizer at all times for first 3 to 4 days, then may only as necessary for comfort once nerve block has worn off. May shower with intact OpSite dressing once nerve block has worn off on postop day 4 (Monday). Apply ice to affected area frequently. Take ibuprofen 600-800 mg TID with meals for 7-10 days, then as necessary. Take ES Tylenol or pain medication as prescribed when needed.  Return for follow-up in 10-14 days or as scheduled.  AMBULATORY SURGERY  DISCHARGE INSTRUCTIONS   The drugs that you were given will stay in your system until tomorrow so for the next 24 hours you should not:  Drive an automobile Make any legal decisions Drink any alcoholic beverage   You may resume regular meals tomorrow.  Today it is better to start with liquids and gradually work up to solid foods.  You may eat anything you prefer, but it is better to start with liquids, then soup and crackers, and gradually work up to solid foods.   Please notify your doctor immediately if you have any unusual bleeding, trouble breathing, redness and pain at the surgery site, drainage, fever, or pain not relieved by medication.    Additional Instructions:        Please contact your physician with any problems or Same Day Surgery at [phone], Monday through Friday 6 am to 4 pm, or [place] [facility] number at [phone].

## 2023-01-08 ENCOUNTER — Encounter: Payer: Self-pay | Admitting: Surgery

## 2023-01-08 NOTE — Anesthesia Postprocedure Evaluation (Signed)
Anesthesia Post Note  Patient: Monica Leon  Procedure(s) Performed: HARDWARE REMOVAL (Right: Shoulder)  Patient location during evaluation: PACU Anesthesia Type: General Level of consciousness: awake and alert Pain management: pain level controlled Vital Signs Assessment: post-procedure vital signs reviewed and stable Respiratory status: spontaneous breathing, nonlabored ventilation and respiratory function stable Cardiovascular status: blood pressure returned to baseline and stable Postop Assessment: no apparent nausea or vomiting Anesthetic complications: no   There were no known notable events for this encounter.   Last Vitals:  Vitals:   01/07/23 1723 01/07/23 1734  BP: 95/62 (!) 100/58  Pulse: 77 78  Resp: 16 14  Temp: 36.7 C 37.2 C  SpO2: 93% 95%    Last Pain:  Vitals:   01/07/23 1734  TempSrc: Temporal  PainSc: 0-No pain                 [place] Ouch

## 2023-01-11 LAB — SURGICAL PATHOLOGY

## 2023-01-12 LAB — AEROBIC/ANAEROBIC CULTURE W GRAM STAIN (SURGICAL/DEEP WOUND)
Culture: NO GROWTH
Gram Stain: NONE SEEN

## 2023-01-15 DIAGNOSIS — Z448 Encounter for fitting and adjustment of other external prosthetic devices: Secondary | ICD-10-CM | POA: Diagnosis not present

## 2023-01-15 DIAGNOSIS — S42221D 2-part displaced fracture of surgical neck of right humerus, subsequent encounter for fracture with routine healing: Secondary | ICD-10-CM | POA: Diagnosis not present

## 2023-01-15 DIAGNOSIS — W108XXD Fall (on) (from) other stairs and steps, subsequent encounter: Secondary | ICD-10-CM | POA: Diagnosis not present

## 2023-01-18 DIAGNOSIS — S42291D Other displaced fracture of upper end of right humerus, subsequent encounter for fracture with routine healing: Secondary | ICD-10-CM | POA: Diagnosis not present

## 2023-02-04 DIAGNOSIS — G8929 Other chronic pain: Secondary | ICD-10-CM | POA: Diagnosis not present

## 2023-02-04 DIAGNOSIS — M545 Low back pain, unspecified: Secondary | ICD-10-CM | POA: Diagnosis not present

## 2023-02-09 DIAGNOSIS — G8929 Other chronic pain: Secondary | ICD-10-CM | POA: Diagnosis not present

## 2023-02-09 DIAGNOSIS — M545 Low back pain, unspecified: Secondary | ICD-10-CM | POA: Diagnosis not present

## 2023-02-15 ENCOUNTER — Other Ambulatory Visit: Payer: Self-pay | Admitting: Internal Medicine

## 2023-02-16 NOTE — Telephone Encounter (Signed)
Requested Prescriptions  Pending Prescriptions Disp Refills   escitalopram (LEXAPRO) 20 MG tablet [Pharmacy Med Name: ESCITALOPRAM 20MG  TABLETS] 90 tablet 0    Sig: TAKE 1 TABLET(20 MG) BY MOUTH EVERY MORNING     Psychiatry:  Antidepressants - SSRI Passed - 02/15/2023  3:18 AM      Passed - Completed PHQ-2 or PHQ-9 in the last 360 days      Passed - Valid encounter within last 6 months    Recent Outpatient Visits           3 months ago Primary hypertension   [place] [facility] [place], [place] W, NP   3 months ago Closed supracondylar fracture of right humerus with delayed healing, subsequent encounter   [place] [facility] [place], [place] W, NP   6 months ago Gastroesophageal reflux disease without esophagitis   [place] [facility] [place], Salvadore Oxford, NP   10 months ago Encounter for general adult medical examination with abnormal findings   [place] [facility] [place], Salvadore Oxford, NP   12 months ago Chronic cough   [place] [facility] [place], Salvadore Oxford, NP               folic acid (FOLVITE) 1 MG tablet [Pharmacy Med Name: FOLIC ACID 1MG  TABLETS] 90 tablet 0    Sig: TAKE 1 TABLET(1 MG) BY MOUTH DAILY     Endocrinology:  Vitamins Passed - 02/15/2023  3:18 AM      Passed - Valid encounter within last 12 months    Recent Outpatient Visits           3 months ago Primary hypertension   [place] [facility] [place], [place] W, NP   3 months ago Closed supracondylar fracture of right humerus with delayed healing, subsequent encounter   [place] [facility] [place], [place] W, NP   6 months ago Gastroesophageal reflux disease without esophagitis   [place] [facility] [place], Salvadore Oxford, NP   10 months ago Encounter for general adult medical examination with abnormal findings   [place] [facility] [place], Salvadore Oxford, NP   12 months  ago Chronic cough   [place] [facility] [place], Salvadore Oxford, [place]

## 2023-02-17 DIAGNOSIS — M545 Low back pain, unspecified: Secondary | ICD-10-CM | POA: Diagnosis not present

## 2023-02-17 DIAGNOSIS — G8929 Other chronic pain: Secondary | ICD-10-CM | POA: Diagnosis not present

## 2023-02-19 DIAGNOSIS — S42221D 2-part displaced fracture of surgical neck of right humerus, subsequent encounter for fracture with routine healing: Secondary | ICD-10-CM | POA: Diagnosis not present

## 2023-02-21 ENCOUNTER — Other Ambulatory Visit: Payer: Self-pay | Admitting: Internal Medicine

## 2023-02-22 NOTE — Telephone Encounter (Signed)
Requested Prescriptions  Pending Prescriptions Disp Refills   mirtazapine (REMERON) 7.5 MG tablet [Pharmacy Med Name: MIRTAZAPINE 7.5MG  TABLETS] 90 tablet 0    Sig: TAKE 1 TABLET(7.5 MG) BY MOUTH AT BEDTIME     Psychiatry: Antidepressants - mirtazapine Passed - 02/21/2023  7:46 AM      Passed - Completed PHQ-2 or PHQ-9 in the last 360 days      Passed - Valid encounter within last 6 months    Recent Outpatient Visits           3 months ago Primary hypertension   [place] [facility] [place], [place] W, NP   4 months ago Closed supracondylar fracture of right humerus with delayed healing, subsequent encounter   [place] [facility] [place], [place] W, NP   6 months ago Gastroesophageal reflux disease without esophagitis   [place] [facility] [place], Salvadore Oxford, NP   11 months ago Encounter for general adult medical examination with abnormal findings   [place] [facility] [place], Salvadore Oxford, NP   1 year ago Chronic cough   [place] [facility] [place], Salvadore Oxford, [place]

## 2023-02-23 DIAGNOSIS — M545 Low back pain, unspecified: Secondary | ICD-10-CM | POA: Diagnosis not present

## 2023-02-23 DIAGNOSIS — G8929 Other chronic pain: Secondary | ICD-10-CM | POA: Diagnosis not present

## 2023-03-02 DIAGNOSIS — G8929 Other chronic pain: Secondary | ICD-10-CM | POA: Diagnosis not present

## 2023-03-02 DIAGNOSIS — M545 Low back pain, unspecified: Secondary | ICD-10-CM | POA: Diagnosis not present

## 2023-03-03 DIAGNOSIS — R200 Anesthesia of skin: Secondary | ICD-10-CM | POA: Diagnosis not present

## 2023-03-03 DIAGNOSIS — R202 Paresthesia of skin: Secondary | ICD-10-CM | POA: Diagnosis not present

## 2023-03-09 DIAGNOSIS — M79671 Pain in right foot: Secondary | ICD-10-CM | POA: Diagnosis not present

## 2023-03-09 DIAGNOSIS — G629 Polyneuropathy, unspecified: Secondary | ICD-10-CM | POA: Diagnosis not present

## 2023-03-09 DIAGNOSIS — R200 Anesthesia of skin: Secondary | ICD-10-CM | POA: Diagnosis not present

## 2023-03-09 DIAGNOSIS — M545 Low back pain, unspecified: Secondary | ICD-10-CM | POA: Diagnosis not present

## 2023-03-12 DIAGNOSIS — G9349 Other encephalopathy: Secondary | ICD-10-CM | POA: Diagnosis not present

## 2023-03-12 DIAGNOSIS — T847XXD Infection and inflammatory reaction due to other internal orthopedic prosthetic devices, implants and grafts, subsequent encounter: Secondary | ICD-10-CM | POA: Diagnosis not present

## 2023-03-12 DIAGNOSIS — A4901 Methicillin susceptible Staphylococcus aureus infection, unspecified site: Secondary | ICD-10-CM | POA: Diagnosis not present

## 2023-03-15 IMAGING — DX DG CHEST 2V
3 series · 3 of 3 positions shown · non-contrast
Comparison: No recent.

CLINICAL DATA: Cough.  Shortness of breath.

EXAM:
CHEST - 2 VIEW

[chest pa (1 of 2)]
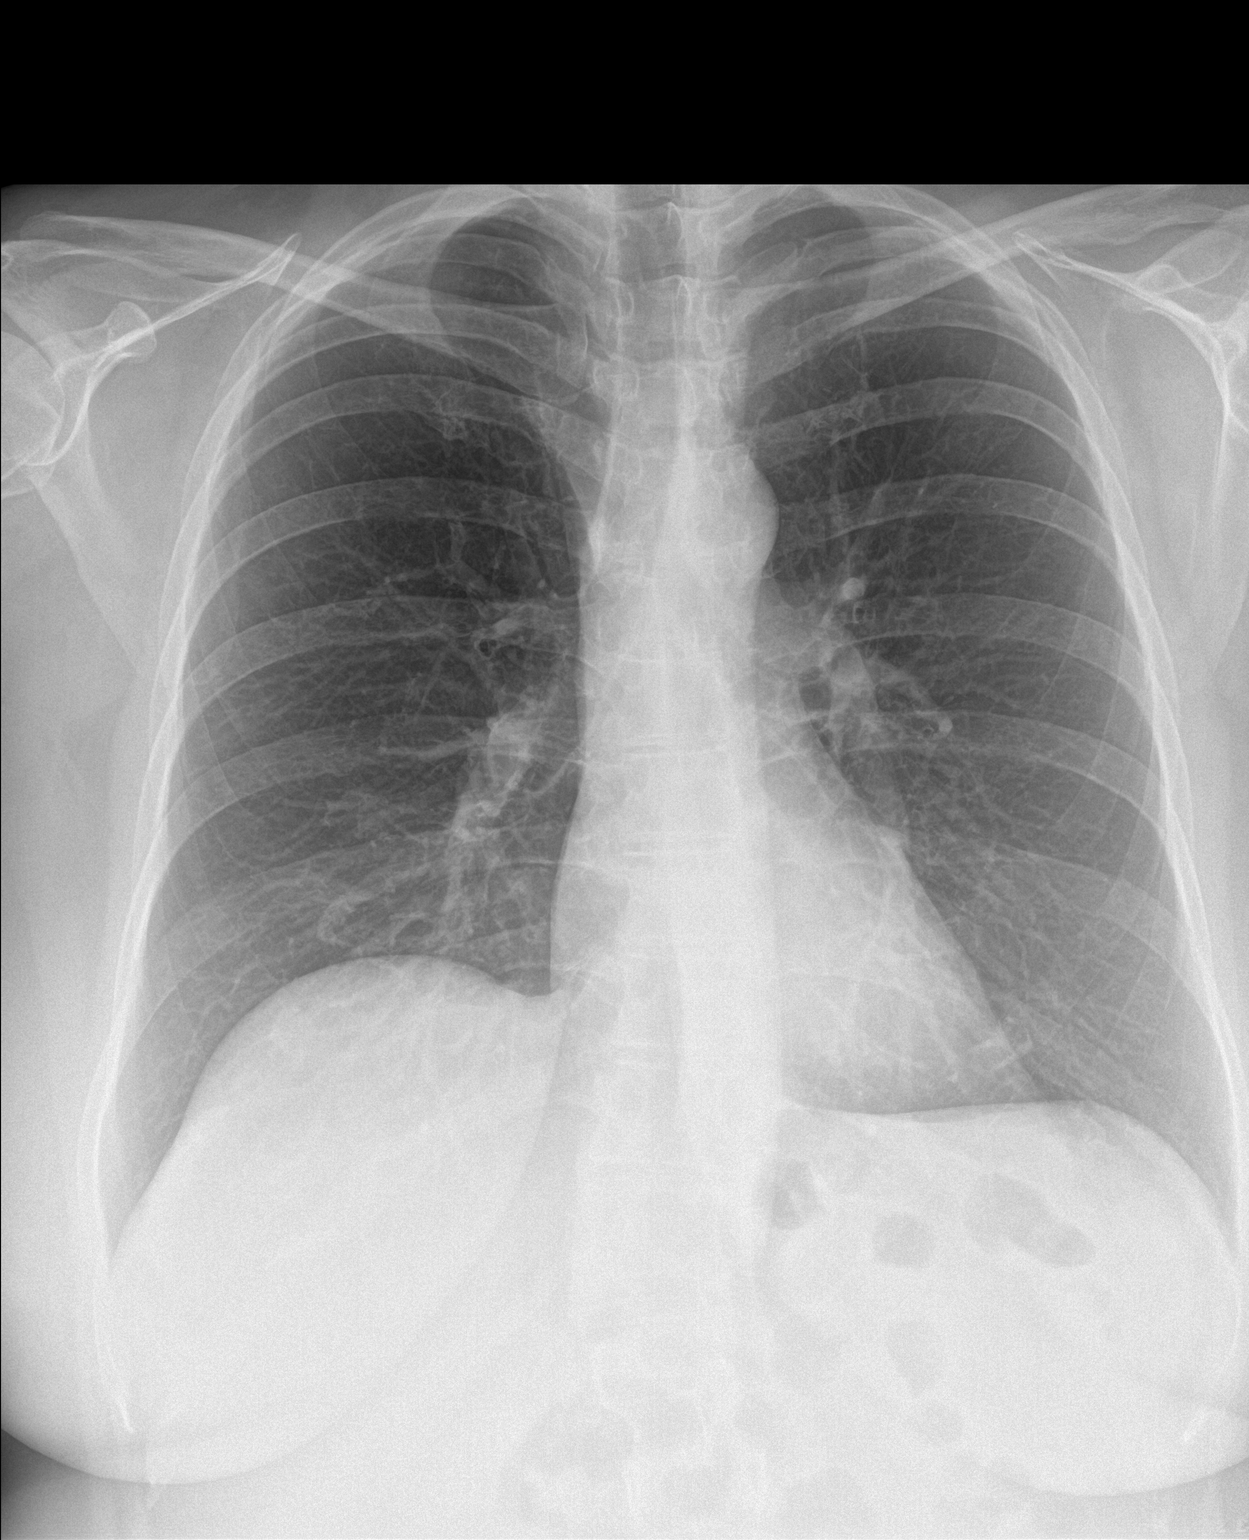

[chest pa (2 of 2)]
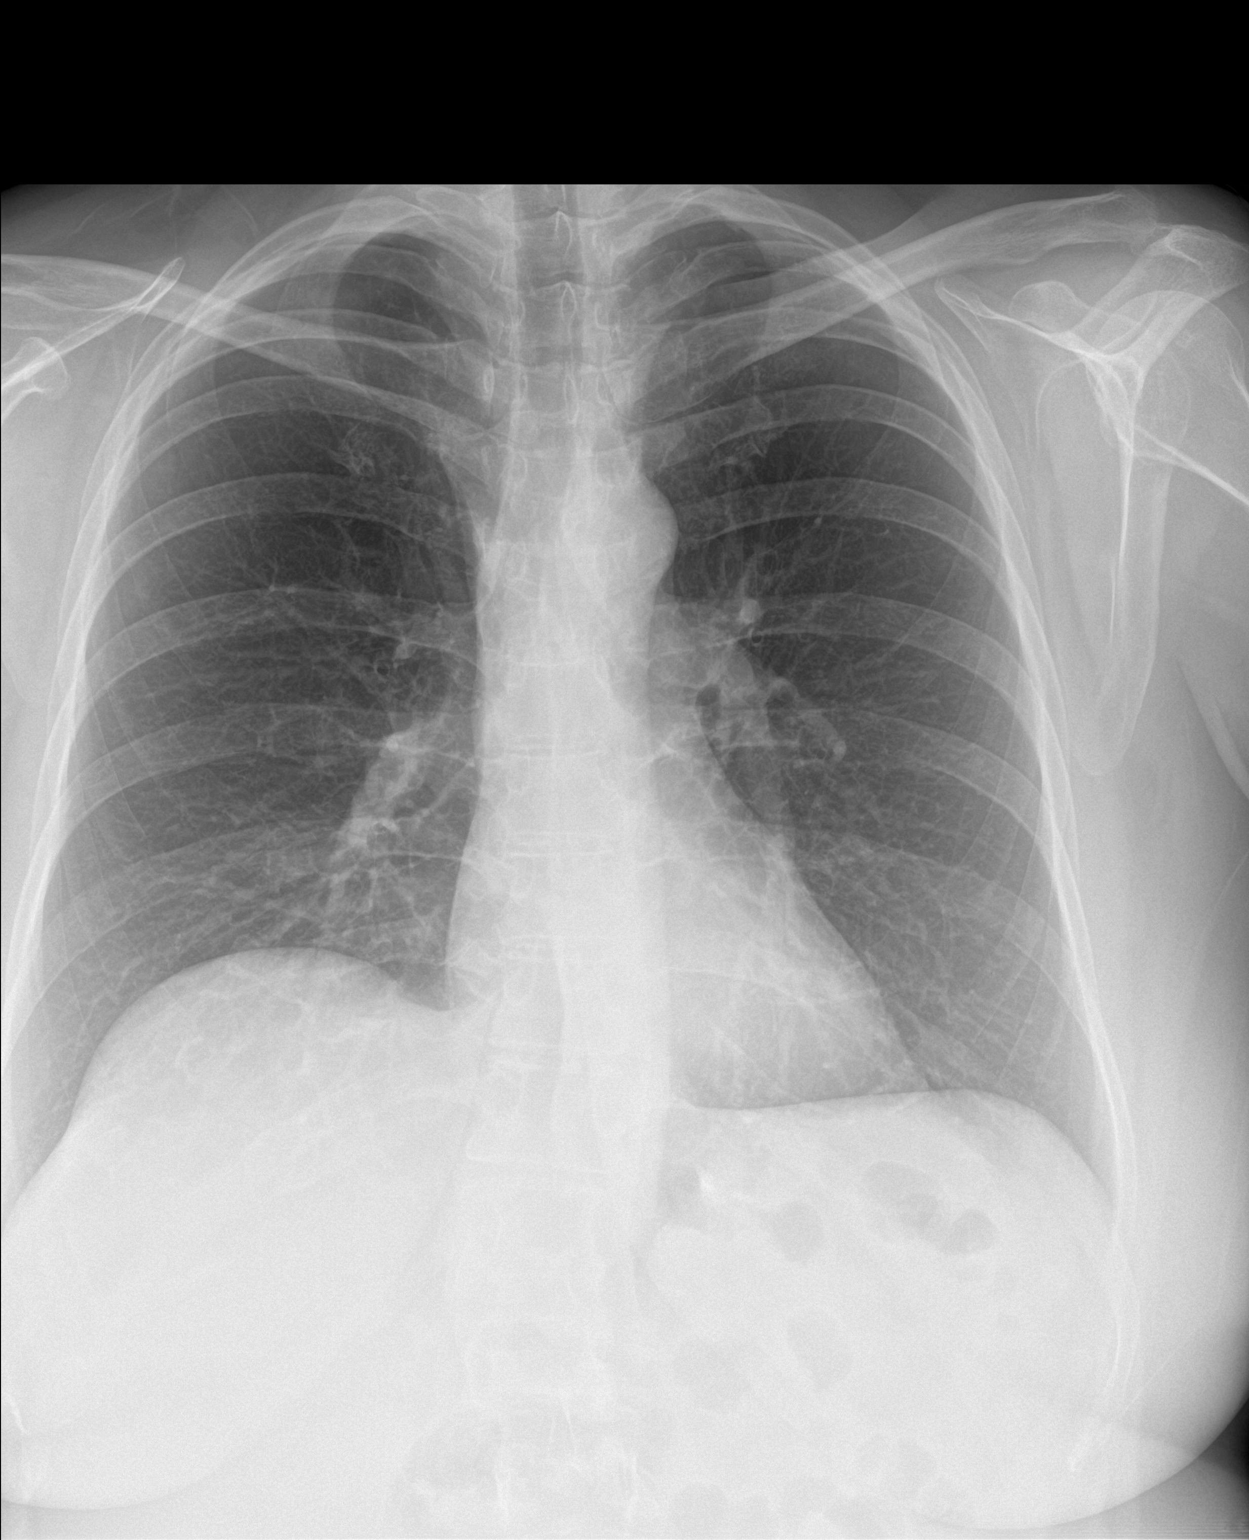

[chest lat]
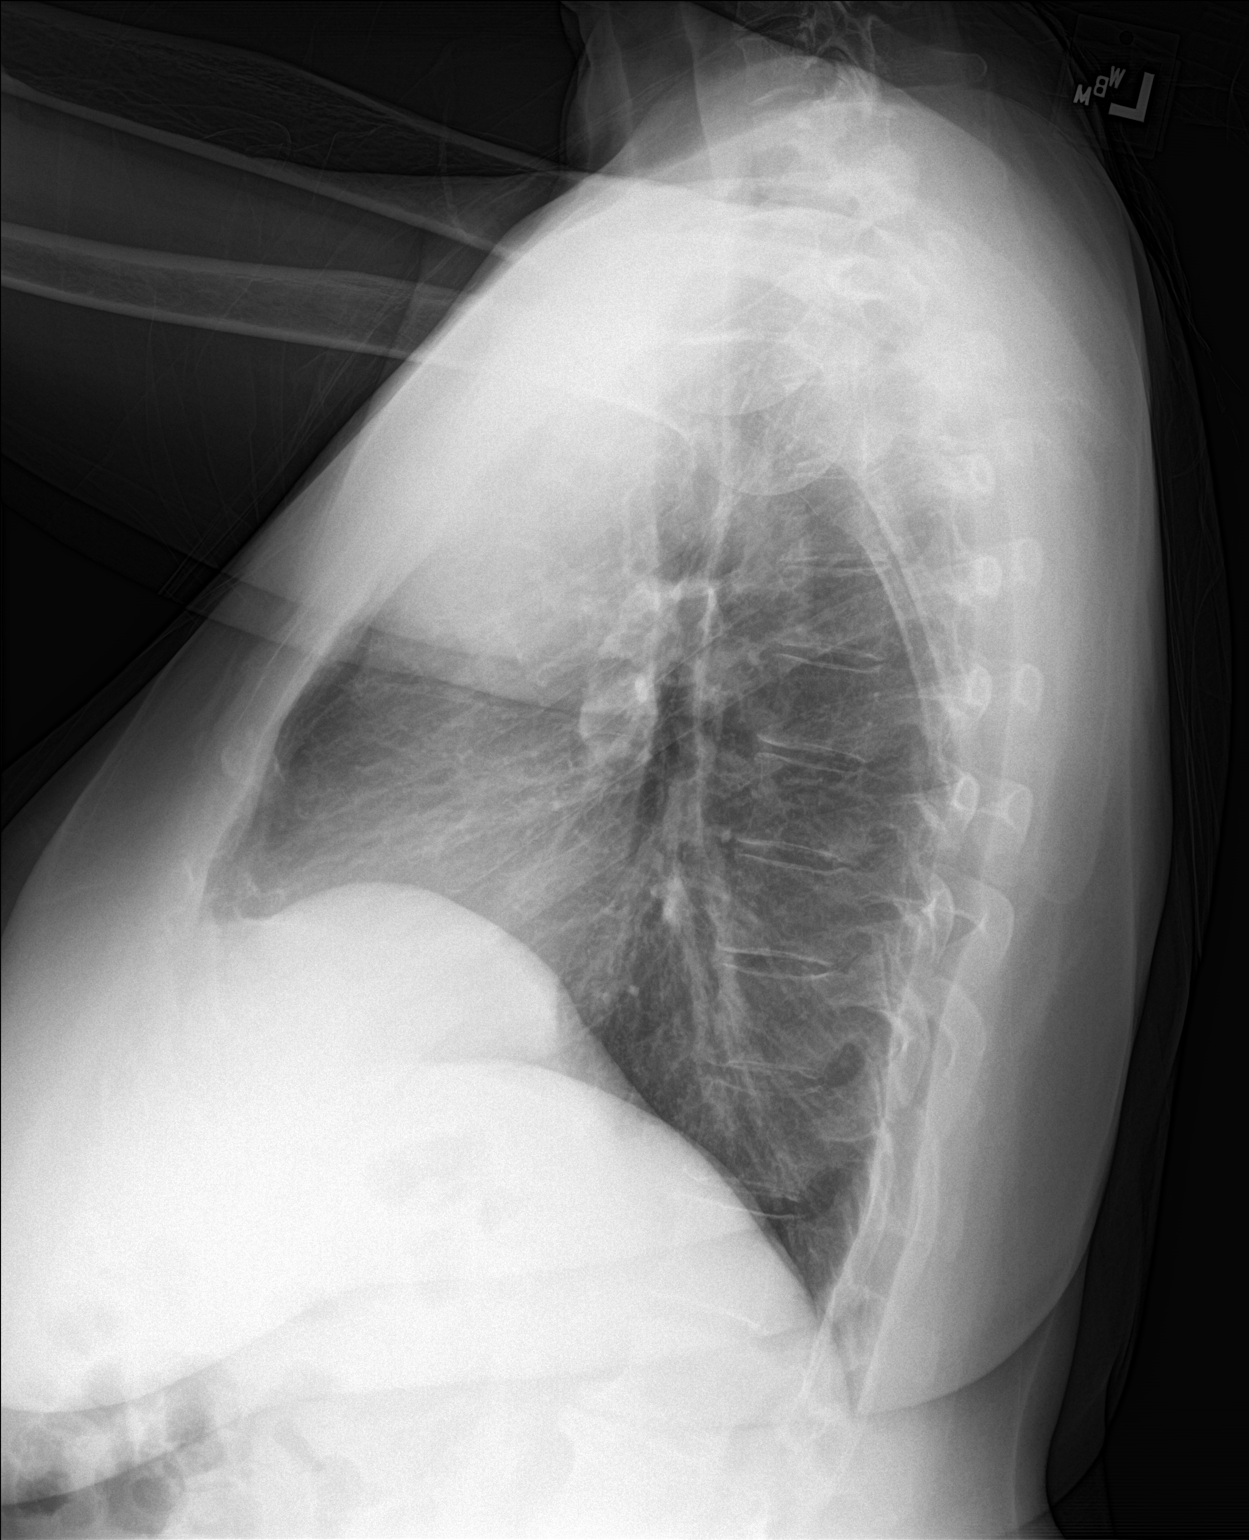

[3 of 3 positions shown; findings below may reference images not displayed]

FINDINGS: Mediastinum hilar structures normal. Lungs are clear. No pleural
effusion or pneumothorax. Heart size normal. Thoracolumbar spine
scoliosis and degenerative change.
IMPRESSION: No acute cardiopulmonary disease.

## 2023-03-16 DIAGNOSIS — G8929 Other chronic pain: Secondary | ICD-10-CM | POA: Diagnosis not present

## 2023-03-16 DIAGNOSIS — M545 Low back pain, unspecified: Secondary | ICD-10-CM | POA: Diagnosis not present

## 2023-03-17 DIAGNOSIS — M5136 Other intervertebral disc degeneration, lumbar region: Secondary | ICD-10-CM | POA: Diagnosis not present

## 2023-03-17 DIAGNOSIS — M4306 Spondylolysis, lumbar region: Secondary | ICD-10-CM | POA: Diagnosis not present

## 2023-03-17 DIAGNOSIS — M545 Low back pain, unspecified: Secondary | ICD-10-CM | POA: Diagnosis not present

## 2023-03-19 ENCOUNTER — Other Ambulatory Visit: Payer: Self-pay | Admitting: Orthopedic Surgery

## 2023-03-19 DIAGNOSIS — M545 Low back pain, unspecified: Secondary | ICD-10-CM

## 2023-03-21 IMAGING — US US ABDOMEN LIMITED
1 series · 14 of 25 positions shown · non-contrast
Comparison: None.

CLINICAL DATA: Elevated LFTs

EXAM:
ULTRASOUND ABDOMEN LIMITED RIGHT UPPER QUADRANT

[Series 1: us abdomen limited · 0.22mm/px · 14 of 43 slices shown]
[im 1/43]
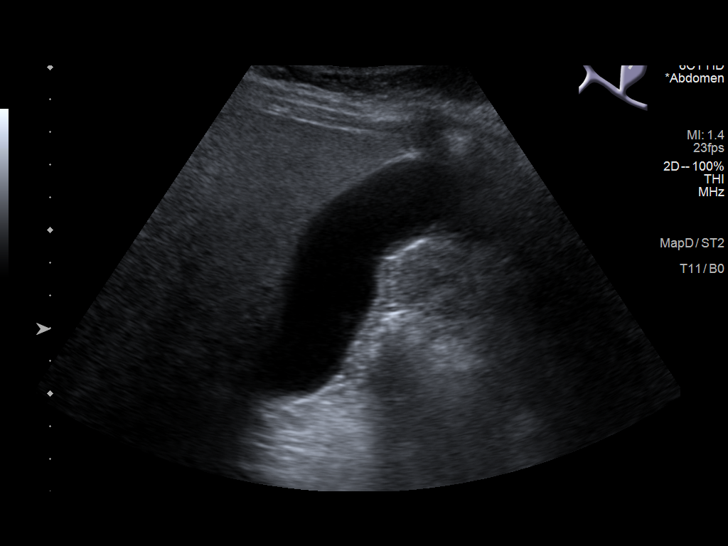
[im 4/43]
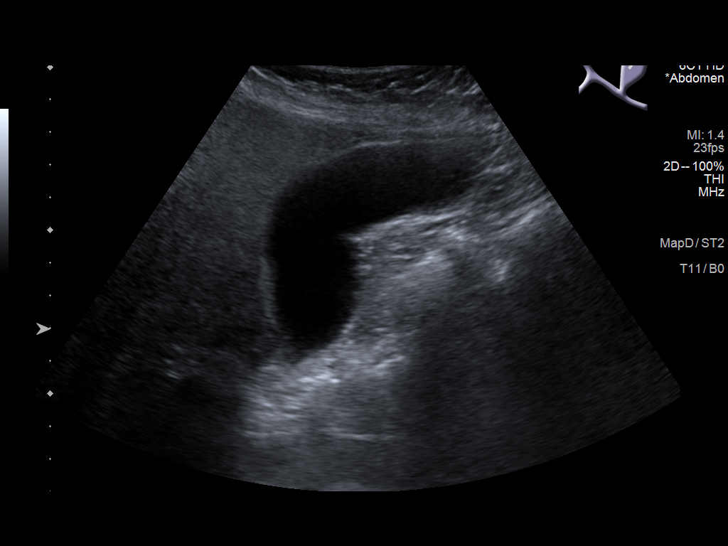
[im 8/43]
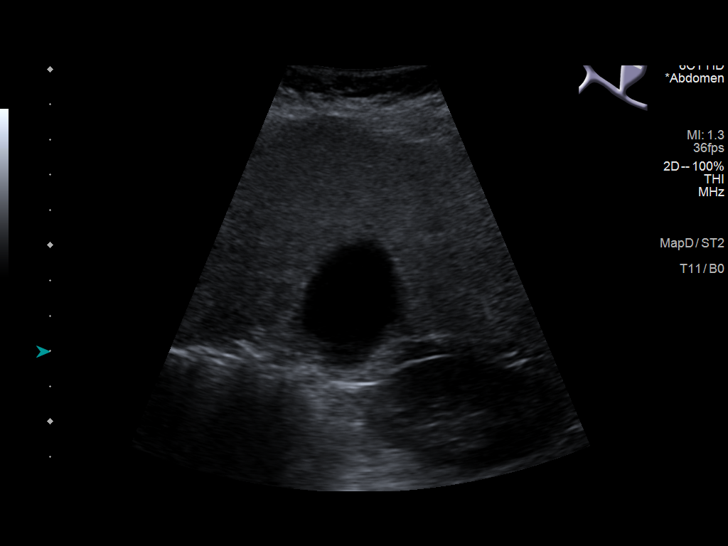
[im 11/43]
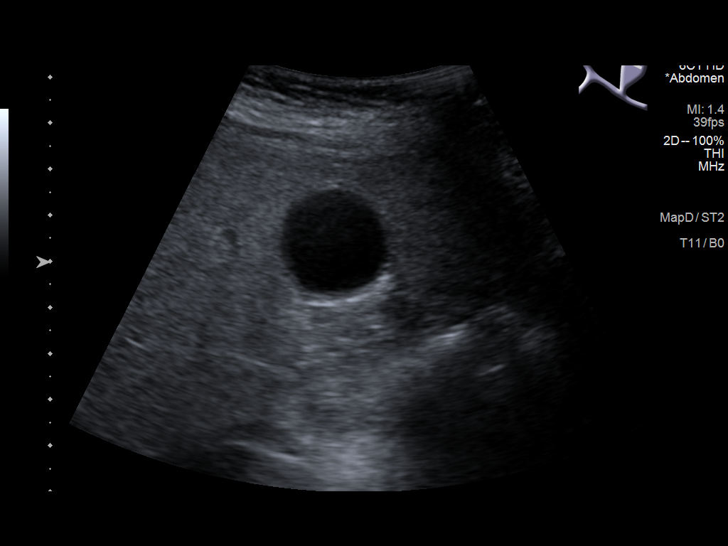
[im 15/43]
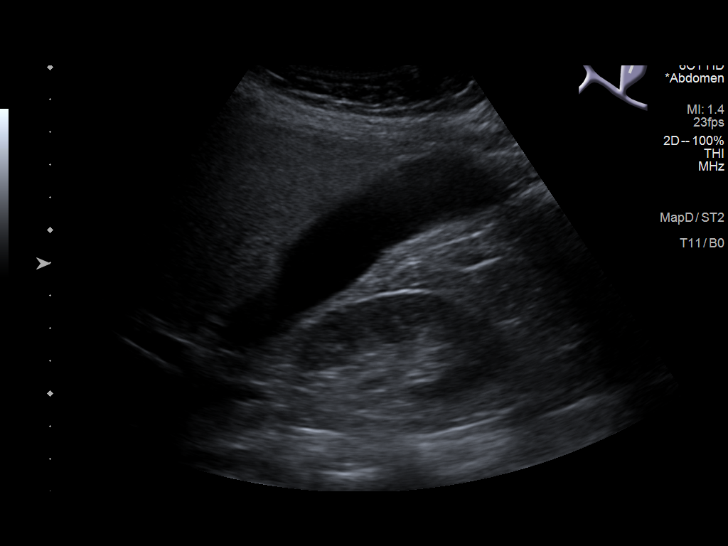
[im 16/43]
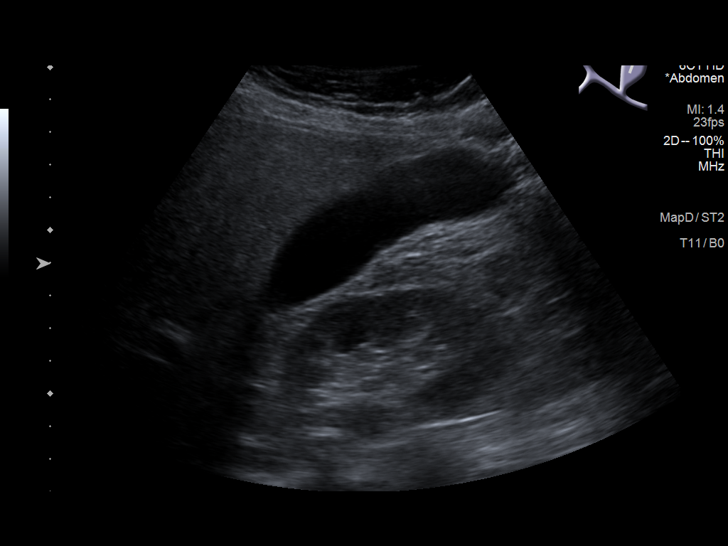
[im 20/43]
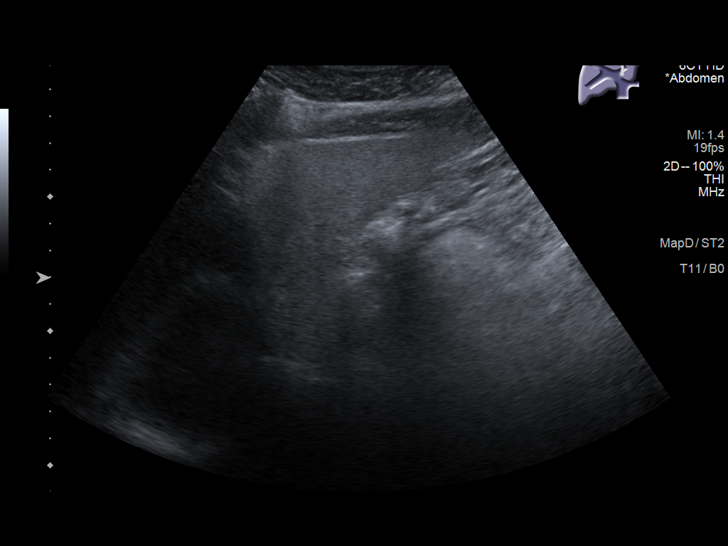
[im 23/43]
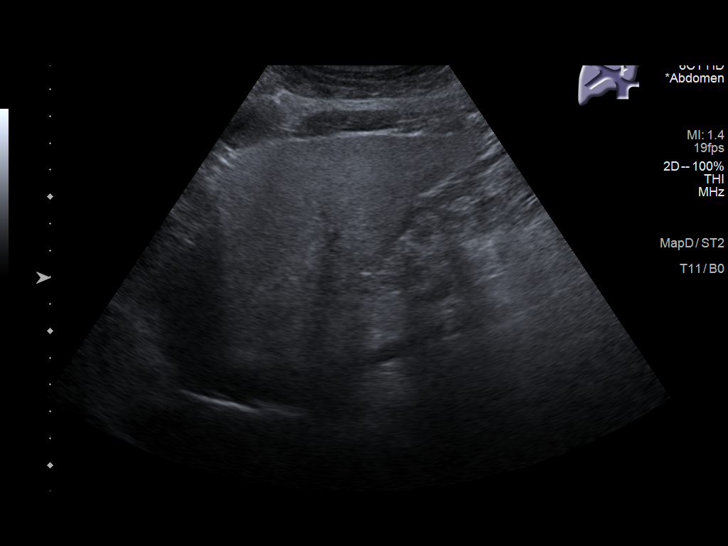
[im 27/43]
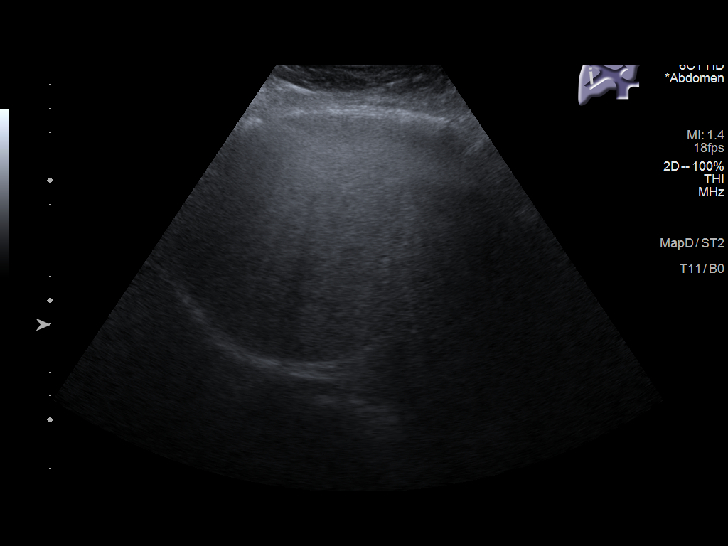
[im 29/43]
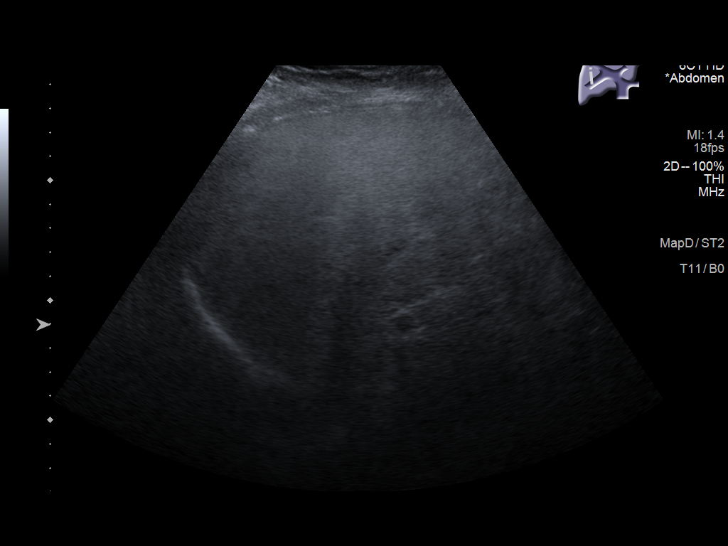
[im 32/43]
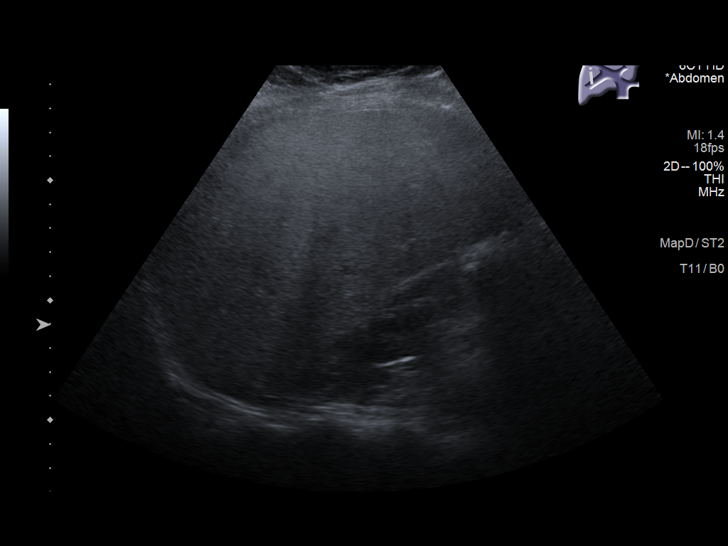
[im 36/43]
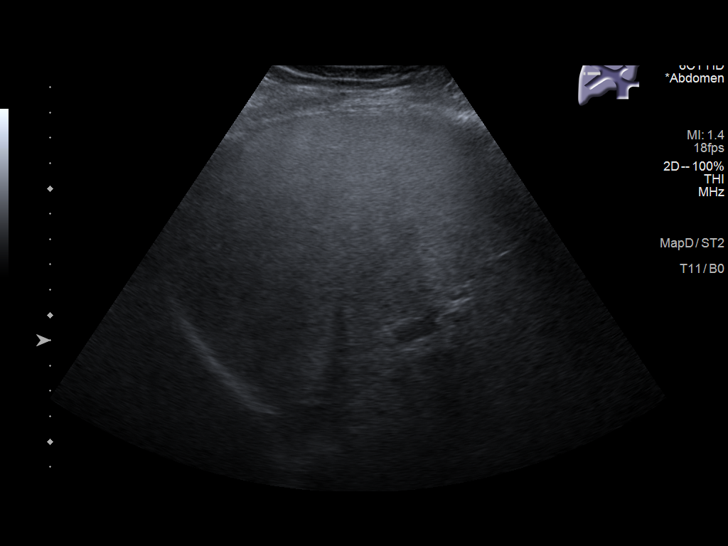
[im 39/43]
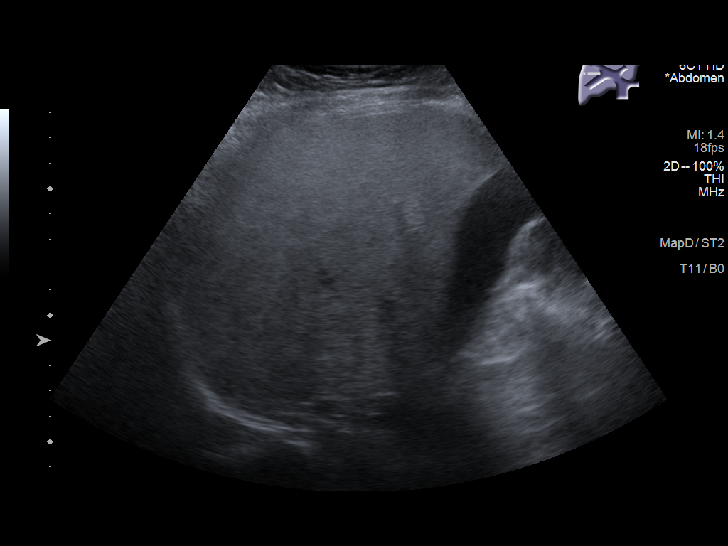
[im 43/43]
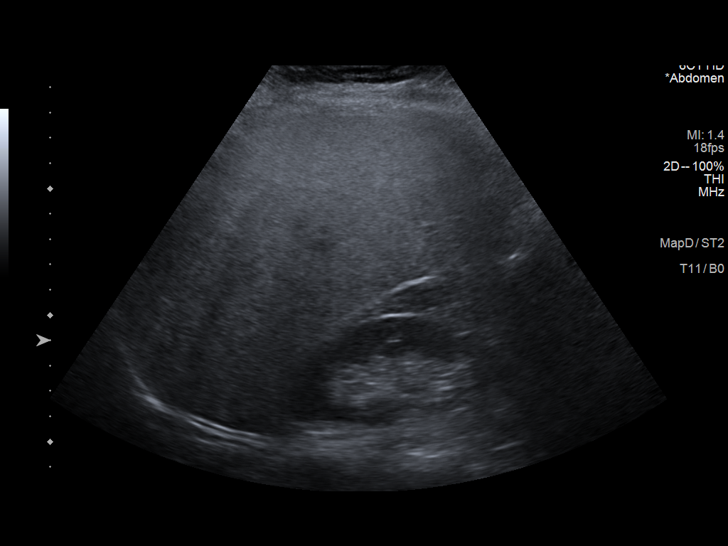

[14 of 25 positions shown; findings below may reference images not displayed]

FINDINGS: Gallbladder:

No gallstones or wall thickening visualized. No sonographic Murphy
sign noted by sonographer.

Common bile duct:

Diameter: 2.4 mm, unremarkable

Liver:

Echogenic coarse parenchyma. No focal lesion. No biliary ductal
dilatation identified. Portal vein is patent on color Doppler
imaging with normal direction of blood flow towards the liver.

Other: None.
IMPRESSION: 1. Normal gallbladder.
2. Coarse echogenic hepatic parenchyma, a nonspecific finding often
associated with fatty infiltration

## 2023-04-03 ENCOUNTER — Ambulatory Visit
Admission: RE | Admit: 2023-04-03 | Discharge: 2023-04-03 | Disposition: A | Discharge status: Home / Self Care | Payer: BC Managed Care – PPO | Source: Ambulatory Visit | Attending: Orthopedic Surgery | Admitting: Orthopedic Surgery

## 2023-04-03 DIAGNOSIS — M9963 Osseous and subluxation stenosis of intervertebral foramina of lumbar region: Secondary | ICD-10-CM | POA: Diagnosis not present

## 2023-04-03 DIAGNOSIS — M4186 Other forms of scoliosis, lumbar region: Secondary | ICD-10-CM | POA: Diagnosis not present

## 2023-04-03 DIAGNOSIS — M545 Low back pain, unspecified: Secondary | ICD-10-CM

## 2023-05-31 ENCOUNTER — Encounter: Payer: Self-pay | Admitting: Internal Medicine

## 2023-06-04 ENCOUNTER — Other Ambulatory Visit: Payer: Self-pay | Admitting: Internal Medicine

## 2023-06-04 DIAGNOSIS — M47816 Spondylosis without myelopathy or radiculopathy, lumbar region: Secondary | ICD-10-CM | POA: Diagnosis not present

## 2023-06-04 NOTE — Telephone Encounter (Signed)
Pt. Has appointment. Requested Prescriptions  Pending Prescriptions Disp Refills   mirtazapine (REMERON) 7.5 MG tablet [Pharmacy Med Name: MIRTAZAPINE 7.5MG  TABLETS] 90 tablet 0    Sig: TAKE 1 TABLET(7.5 MG) BY MOUTH AT BEDTIME     Psychiatry: Antidepressants - mirtazapine Failed - 06/04/2023  3:23 AM      Failed - Valid encounter within last 6 months    Recent Outpatient Visits           7 months ago Primary hypertension   [place] [facility] [place], [place] W, NP   7 months ago Closed supracondylar fracture of right humerus with delayed healing, subsequent encounter   [place] [facility] [place], [place] W, NP   10 months ago Gastroesophageal reflux disease without esophagitis   [place] [facility] [employer], Salvadore Oxford, NP   1 year ago Encounter for general adult medical examination with abnormal findings   [facility] [place], Salvadore Oxford, NP   1 year ago Chronic cough   [place] [facility] [place], Salvadore Oxford, NP       Future Appointments             In 3 weeks [place], [place] Oxford, NP [place] [facility]            Passed - Completed PHQ-2 or PHQ-9 in the last 360 days       folic acid (FOLVITE) 1 MG tablet [Pharmacy Med Name: FOLIC ACID 1MG  TABLETS] 90 tablet 0    Sig: TAKE 1 TABLET(1 MG) BY MOUTH DAILY     Endocrinology:  Vitamins Passed - 06/04/2023  3:23 AM      Passed - Valid encounter within last 12 months    Recent Outpatient Visits           7 months ago Primary hypertension   [place] [facility] [place], [place] W, NP   7 months ago Closed supracondylar fracture of right humerus with delayed healing, subsequent encounter   [place] [facility] [place], [place] W, NP   10 months ago Gastroesophageal reflux disease without esophagitis   [place] [facility] [place], Salvadore Oxford, NP   1 year ago  Encounter for general adult medical examination with abnormal findings   [place] [facility] [place], Salvadore Oxford, NP   1 year ago Chronic cough   [place] [facility] [place], Salvadore Oxford, NP       Future Appointments             In 3 weeks Sam[place] Si, Salv[place] Oxford, NP [place] [facility] [place], [facility]             escitalopram (LEXAPRO) 20 MG tablet [Pharmacy Med Name: ESCITALOPRAM 20MG  TABLETS] 90 tablet 0    Sig: TAKE 1 TABLET(20 MG) BY MOUTH EVERY MORNING     Psychiatry:  Antidepressants - SSRI Failed - 06/04/2023  3:23 AM      Failed - Valid encounter within last 6 months    Recent Outpatient Visits           7 months ago Primary hypertension   [place] [facility] [place], [place] W, NP   7 months ago Closed supracondylar fracture of right humerus with delayed healing, subsequent encounter   [place] [facility] [place], Salvad[place], [place]  10 months ago Gastroesophageal reflux disease without esophagitis   [place] [facility] [place], Salvadore Oxford, NP   1 year ago Encounter for general adult medical examination with abnormal findings   [place] [facility] [place], Salvadore Oxford, NP   1 year ago Chronic cough   [place] [facility] [place], Salvadore Oxford, NP       Future Appointments             In 3 weeks Sam[place] Si, Salvad[place] Oxford, NP [facility]            Passed - Completed PHQ-2 or PHQ-9 in the last 360 days

## 2023-06-25 ENCOUNTER — Ambulatory Visit (INDEPENDENT_AMBULATORY_CARE_PROVIDER_SITE_OTHER): Payer: BC Managed Care – PPO | Admitting: Internal Medicine

## 2023-06-25 ENCOUNTER — Encounter: Payer: Self-pay | Admitting: Internal Medicine

## 2023-06-25 VITALS — BP 126/84 | HR 77 | Temp 96.8°F | Ht 64.0 in | Wt 184.0 lb

## 2023-06-25 DIAGNOSIS — Z1231 Encounter for screening mammogram for malignant neoplasm of breast: Secondary | ICD-10-CM | POA: Diagnosis not present

## 2023-06-25 DIAGNOSIS — E6609 Other obesity due to excess calories: Secondary | ICD-10-CM

## 2023-06-25 DIAGNOSIS — R7309 Other abnormal glucose: Secondary | ICD-10-CM | POA: Diagnosis not present

## 2023-06-25 DIAGNOSIS — Z0001 Encounter for general adult medical examination with abnormal findings: Secondary | ICD-10-CM | POA: Diagnosis not present

## 2023-06-25 DIAGNOSIS — Z136 Encounter for screening for cardiovascular disorders: Secondary | ICD-10-CM

## 2023-06-25 DIAGNOSIS — Z6831 Body mass index (BMI) 31.0-31.9, adult: Secondary | ICD-10-CM

## 2023-06-25 DIAGNOSIS — E66811 Obesity, class 1: Secondary | ICD-10-CM | POA: Insufficient documentation

## 2023-06-25 NOTE — Progress Notes (Unsigned)
Subjective:    Patient ID: Monica Leon, female    DOB: 01/22/1971, 52 y.o.   MRN: [id number]  HPI  Patient presents to clinic today for her annual exam.  Flu: 08/2022 Tetanus: 03/2022 COVID: X 2 Shingrix: 08/2021, 01/2022 Pap smear: > 5 years ago Mammogram: 04/2022 Colon screening: 03/2022 Vision screening: annually Dentist: biannually  Diet: She does eat meat. She consumes fruits and veggies. She does eat some fried foods. She drinks mostly water. Exercise: None  Review of Systems  Past Medical History:  Diagnosis Date   Anxiety    Depression    Fatty liver determined by biopsy    GERD (gastroesophageal reflux disease)    Hypertension    off meds 1 year   Migraine    Muscle atrophy of lower extremity    PONV (postoperative nausea and vomiting)    with ankle surgery   Unintentional weight loss 2023   80 pounds in a year-has no appetite    Current Outpatient Medications  Medication Sig Dispense Refill   acetaminophen (TYLENOL) 500 MG tablet Take 1,000 mg by mouth every 6 (six) hours as needed.     escitalopram (LEXAPRO) 20 MG tablet TAKE 1 TABLET(20 MG) BY MOUTH EVERY MORNING 90 tablet 0   folic acid (FOLVITE) 1 MG tablet TAKE 1 TABLET(1 MG) BY MOUTH DAILY 90 tablet 0   gabapentin (NEURONTIN) 100 MG capsule Take 2 capsules (200 mg total) by mouth at bedtime. 180 capsule 0   metoCLOPramide (REGLAN) 10 MG tablet Take 1 tablet (10 mg total) by mouth every 8 (eight) hours as needed for nausea. 30 tablet 0   mirtazapine (REMERON) 7.5 MG tablet TAKE 1 TABLET(7.5 MG) BY MOUTH AT BEDTIME 90 tablet 0   Multiple Vitamin (MULTIVITAMIN) tablet Take 1 tablet by mouth daily.     omeprazole (PRILOSEC) 40 MG capsule TAKE 1 CAPSULE(40 MG) BY MOUTH DAILY 90 capsule 1   ondansetron (ZOFRAN-ODT) 4 MG disintegrating tablet Take 1 tablet (4 mg total) by mouth every 8 (eight) hours as needed for nausea or vomiting. 30 tablet 0   oxyCODONE (ROXICODONE) 5 MG immediate release tablet Take  1-2 tablets (5-10 mg total) by mouth every 4 (four) hours as needed for moderate pain or severe pain. 30 tablet 0   No current facility-administered medications for this visit.    No Known Allergies  Family History  Problem Relation Age of Onset   Healthy Mother    Prostate cancer Father     Social History   Socioeconomic History   Marital status: Significant Other    Spouse name: Thayer Ohm   Number of children: Not on file   Years of education: Not on file   Highest education level: Not on file  Occupational History   Not on file  Tobacco Use   Smoking status: Never   Smokeless tobacco: Never  Vaping Use   Vaping status: Never Used  Substance and Sexual Activity   Alcohol use: Not Currently    Alcohol/week: 6.0 standard drinks of alcohol    Types: 6 Standard drinks or equivalent per week    Comment: 1-2 drinks per day   Drug use: Never   Sexual activity: Yes  Other Topics Concern   Not on file  Social History Narrative   Not on file   Social Determinants of Health   Financial Resource Strain: Low Risk  (10/13/2022)   [facility], [facility]   Overall Financial  Resource Strain (CARDIA)    Difficulty of Paying Living Expenses: Not hard at all  Food Insecurity: No Food Insecurity (10/13/2022)   [facility], [facility]   Hunger Vital Sign    Worried About Running Out of Food in the Last Year: Never true    Ran Out of Food in the Last Year: Never true  Transportation Needs: No Transportation Needs (10/13/2022)   [facility], [facility]   [facility] - Transportation    In the past 12 months, has lack of transportation kept you from medical appointments or from getting medications?: No    Lack of Transportation (Non-Medical): No  Physical Activity: Not on file  Stress: Not on file  Social Connections: Not on file  Intimate  Partner Violence: Not At Risk (07/26/2022)   Humiliation, Afraid, Rape, and Kick questionnaire    Fear of Current or Ex-Partner: No    Emotionally Abused: No    Physically Abused: No    Sexually Abused: No     Constitutional: Denies fever, malaise, fatigue, headache or abrupt weight changes.  HEENT: Denies eye pain, eye redness, ear pain, ringing in the ears, wax buildup, runny nose, nasal congestion, bloody nose, or sore throat. Respiratory: Denies difficulty breathing, shortness of breath, cough or sputum production.   Cardiovascular: Denies chest pain, chest tightness, palpitations or swelling in the hands or feet.  Gastrointestinal: Denies abdominal pain, bloating, constipation, diarrhea or blood in the stool.  GU: Denies urgency, frequency, pain with urination, burning sensation, blood in urine, odor or discharge. Musculoskeletal: Pt reports chronic low back pain. Denies decrease in range of motion, difficulty with gait, muscle pain or joint  swelling.  Skin: Denies redness, rashes, lesions or ulcercations.  Neurological: Pt reports neuropathic pain. Denies dizziness, difficulty with memory, difficulty with speech or problems with balance and coordination.  Psych: Patient has a history of anxiety and depression.  Denies SI/HI.  No other specific complaints in a complete review of systems (except as listed in HPI above).     Objective:   Physical Exam   BP 126/84 (BP Location: Left Arm, Patient Position: Sitting, Cuff Size: Normal)   Pulse 77   Temp (!) 96.8 F (36 C) (Temporal)   Ht 5\' 4"  (1.626 m)   Wt 184 lb (83.5 kg)   SpO2 97%   BMI 31.58 kg/m   Wt Readings from Last 3 Encounters:  01/07/23 150 lb (68 kg)  10/27/22 141 lb (64 kg)  08/20/22 146 lb (66.2 kg)    General: Appears her stated age, obese, in NAD. Skin: Warm, dry and intact.  HEENT: Head: normal shape and size; Eyes: sclera white, no icterus, conjunctiva pink, PERRLA and EOMs intact;  Neck:  Neck supple,  trachea midline. No masses, lumps or thyromegaly present.  Cardiovascular: Normal rate and rhythm. S1,S2 noted.  No murmur, rubs or gallops noted. No JVD or BLE edema. No carotid bruits noted. Pulmonary/Chest: Normal effort and positive vesicular breath sounds. No respiratory distress. No wheezes, rales or ronchi noted.  Abdomen: Normal bowel sounds.  Musculoskeletal: Strength 5/5 BUE/BLE. No difficulty with gait.  Neurological: Alert and oriented. Cranial nerves II-XII grossly intact. Coordination normal.  Psychiatric: Mood and affect normal. Behavior is normal. Judgment and thought content normal.     BMET    Component Value Date/Time   NA 135 07/26/2022 1545   K 3.5 07/26/2022 1545   CL 98 07/26/2022 1545  CO2 23 07/26/2022 1545   GLUCOSE 97 07/26/2022 1545   BUN 9 07/26/2022 1545   CREATININE 0.69 07/26/2022 1545   CREATININE 0.76 12/23/2021 1332   CALCIUM 8.8 (L) 07/26/2022 1545   GFRNONAA >60 07/26/2022 1545    Lipid Panel  No results found for: "CHOL", "TRIG", "HDL", "CHOLHDL", "VLDL", "LDLCALC"  CBC    Component Value Date/Time   WBC 7.5 07/26/2022 1545   RBC 3.95 07/26/2022 1545   HGB 13.0 07/26/2022 1545   HCT 39.3 07/26/2022 1545   PLT 251 07/26/2022 1545   MCV 99.5 07/26/2022 1545   MCH 32.9 07/26/2022 1545   MCHC 33.1 07/26/2022 1545   RDW 13.3 07/26/2022 1545    Hgb A1C Lab Results  Component Value Date   HGBA1C 4.7 12/23/2021           Assessment & Plan:   Preventative health maintenance:  Encouraged her to get a flu shot in the fall Tetanus UTD Encouraged her to get her COVID booster Shingrix vaccine UTD Pap smear declined today Mammogram ordered-she will call to schedule Colon screening UTD Encouraged her to consume a balanced diet and exercise regimen Advised her to see an eye doctor and dentist annually We will check CBC, c-Met, lipid, A1c today  RTC in 6 months, follow-up chronic conditions Nicki Reaper, NP

## 2023-06-25 NOTE — Assessment & Plan Note (Signed)
Encouraged diet and exercise for weight loss ?

## 2023-06-25 NOTE — Patient Instructions (Signed)

## 2023-06-26 LAB — LIPID PANEL
Cholesterol: 240 mg/dL — ABNORMAL HIGH (ref <200)
HDL: 126 mg/dL (ref >=50)
LDL Cholesterol (Calc): 83 mg/dL
Non-HDL Cholesterol (Calc): 114 mg/dL (ref <130)
Total CHOL/HDL Ratio: 1.9 (calc) (ref <5.0)
Triglycerides: 213 mg/dL — ABNORMAL HIGH (ref <150)

## 2023-06-26 LAB — HEMOGLOBIN A1C
Hgb A1c MFr Bld: 4.9 %{Hb} (ref <5.7)
Mean Plasma Glucose: 94 mg/dL
eAG (mmol/L): 5.2 mmol/L

## 2023-06-26 LAB — COMPLETE METABOLIC PANEL WITH GFR
AG Ratio: 1.8 (calc) (ref 1.0–2.5)
ALT: 12 U/L (ref 6–29)
AST: 19 U/L (ref 10–35)
Albumin: 4.2 g/dL (ref 3.6–5.1)
Alkaline phosphatase (APISO): 107 U/L (ref 37–153)
BUN: 21 mg/dL (ref 7–25)
CO2: 26 mmol/L (ref 20–32)
Calcium: 9.6 mg/dL (ref 8.6–10.4)
Chloride: 105 mmol/L (ref 98–110)
Creat: 0.66 mg/dL (ref 0.50–1.03)
Globulin: 2.4 g/dL (ref 1.9–3.7)
Glucose, Bld: 94 mg/dL (ref 65–139)
Potassium: 4.5 mmol/L (ref 3.5–5.3)
Sodium: 140 mmol/L (ref 135–146)
Total Bilirubin: 0.7 mg/dL (ref 0.2–1.2)
Total Protein: 6.6 g/dL (ref 6.1–8.1)
eGFR: 105 mL/min/{1.73_m2} (ref >=60)

## 2023-06-26 LAB — CBC
HCT: 37.2 % (ref 35.0–45.0)
Hemoglobin: 12.6 g/dL (ref 11.7–15.5)
MCH: 31.7 pg (ref 27.0–33.0)
MCHC: 33.9 g/dL (ref 32.0–36.0)
MCV: 93.7 fL (ref 80.0–100.0)
MPV: 10.5 fL (ref 7.5–12.5)
Platelets: 172 10*3/uL (ref 140–400)
RBC: 3.97 10*6/uL (ref 3.80–5.10)
RDW: 12.8 % (ref 11.0–15.0)
WBC: 5.5 10*3/uL (ref 3.8–10.8)

## 2023-07-02 DIAGNOSIS — M47816 Spondylosis without myelopathy or radiculopathy, lumbar region: Secondary | ICD-10-CM | POA: Diagnosis not present

## 2023-07-05 DIAGNOSIS — R200 Anesthesia of skin: Secondary | ICD-10-CM | POA: Diagnosis not present

## 2023-07-05 DIAGNOSIS — G8929 Other chronic pain: Secondary | ICD-10-CM | POA: Diagnosis not present

## 2023-07-05 DIAGNOSIS — M545 Low back pain, unspecified: Secondary | ICD-10-CM | POA: Diagnosis not present

## 2023-07-05 DIAGNOSIS — G629 Polyneuropathy, unspecified: Secondary | ICD-10-CM | POA: Diagnosis not present

## 2023-07-14 ENCOUNTER — Ambulatory Visit
Admission: RE | Admit: 2023-07-14 | Discharge: 2023-07-14 | Disposition: A | Discharge status: Home / Self Care | Payer: BC Managed Care – PPO | Source: Ambulatory Visit | Attending: Internal Medicine | Admitting: Internal Medicine

## 2023-07-14 DIAGNOSIS — Z1231 Encounter for screening mammogram for malignant neoplasm of breast: Secondary | ICD-10-CM | POA: Insufficient documentation

## 2023-07-28 DIAGNOSIS — Z9884 Bariatric surgery status: Secondary | ICD-10-CM | POA: Diagnosis not present

## 2023-07-28 DIAGNOSIS — K760 Fatty (change of) liver, not elsewhere classified: Secondary | ICD-10-CM | POA: Diagnosis not present

## 2023-07-28 DIAGNOSIS — Z975 Presence of (intrauterine) contraceptive device: Secondary | ICD-10-CM | POA: Diagnosis not present

## 2023-07-28 DIAGNOSIS — Z23 Encounter for immunization: Secondary | ICD-10-CM | POA: Diagnosis not present

## 2023-08-10 ENCOUNTER — Encounter: Payer: Self-pay | Admitting: Internal Medicine

## 2023-08-13 DIAGNOSIS — M47816 Spondylosis without myelopathy or radiculopathy, lumbar region: Secondary | ICD-10-CM | POA: Diagnosis not present

## 2023-09-18 ENCOUNTER — Other Ambulatory Visit: Payer: Self-pay | Admitting: Internal Medicine

## 2023-09-20 NOTE — Telephone Encounter (Signed)
Requested Prescriptions  Pending Prescriptions Disp Refills   escitalopram (LEXAPRO) 20 MG tablet [Pharmacy Med Name: ESCITALOPRAM 20MG  TABLETS] 90 tablet 1    Sig: TAKE 1 TABLET(20 MG) BY MOUTH EVERY MORNING     Psychiatry:  Antidepressants - SSRI Passed - 09/18/2023  3:20 AM      Passed - Completed PHQ-2 or PHQ-9 in the last 360 days      Passed - Valid encounter within last 6 months    Recent Outpatient Visits           2 months ago Encounter for general adult medical examination with abnormal findings   [facility] [place], Salvadore Oxford, NP   10 months ago Primary hypertension   [facility] [place], [place] W, NP   11 months ago Closed supracondylar fracture of right humerus with delayed healing, subsequent encounter   [place] [facility] [place], Salvadore Oxford, NP   1 year ago Gastroesophageal reflux disease without esophagitis   [place] [facility] [place], Salvadore Oxford, NP   1 year ago Encounter for general adult medical examination with abnormal findings   [place] [facility] [place], Salvadore Oxford, NP       Future Appointments             In 3 months Baity, Salvadore Oxford, NP [facility]             folic acid (FOLVITE) 1 MG tablet [Pharmacy Med Name: FOLIC ACID 1MG  TABLETS] 90 tablet 3    Sig: TAKE 1 TABLET(1 MG) BY MOUTH DAILY     Endocrinology:  Vitamins Passed - 09/18/2023  3:20 AM      Passed - Valid encounter within last 12 months    Recent Outpatient Visits           2 months ago Encounter for general adult medical examination with abnormal findings   [place] [facility] [place], Salvadore Oxford, NP   10 months ago Primary hypertension   [place] [facility] [place], [place] W, NP   11 months ago Closed supracondylar fracture of right humerus with delayed healing, subsequent encounter   [place] [facility] [place], Salvadore Oxford, NP   1 year ago Gastroesophageal reflux disease without esophagitis   [facility] [place], Salvadore Oxford, NP   1 year ago Encounter for general adult medical examination with abnormal findings   [place] [facility] [place], Salvadore Oxford, NP       Future Appointments             In 3 months Baity, Salvadore Oxford, NP  [facility]            Refused Prescriptions Disp Refills   mirtazapine (REMERON) 7.5 MG tablet [Pharmacy Med Name: MIRTAZAPINE 7.5MG  TABLETS] 90 tablet 0    Sig: TAKE 1 TABLET(7.5 MG) BY MOUTH AT BEDTIME     Psychiatry: Antidepressants - mirtazapine Passed - 09/18/2023  3:20 AM      Passed - Completed PHQ-2 or PHQ-9 in the last 360 days      Passed - Valid encounter within last 6 months    Recent Outpatient Visits           2 months ago Encounter for general adult medical examination with abnormal findings   [facility] [place], Salvadore Oxford, NP   10 months ago Primary hypertension   [facility] [place], [place], NP   11 months ago Closed supracondylar fracture of right humerus with delayed healing, subsequent encounter   [facility] [place], Salvadore Oxford, NP   1 year ago Gastroesophageal reflux disease without esophagitis   [facility] [place], Salvadore Oxford, NP   1 year ago Encounter for general adult medical examination with abnormal findings   [place] [facility] [place], Salvadore Oxford, NP       Future Appointments             In 3 months Baity, Salvadore Oxford, NP  [facility], [facility]

## 2023-11-19 ENCOUNTER — Ambulatory Visit: Payer: BC Managed Care – PPO | Admitting: Internal Medicine

## 2023-11-24 ENCOUNTER — Telehealth: Payer: Self-pay

## 2023-11-24 ENCOUNTER — Ambulatory Visit: Payer: BC Managed Care – PPO | Admitting: Internal Medicine

## 2023-11-24 ENCOUNTER — Encounter: Payer: Self-pay | Admitting: Internal Medicine

## 2023-11-24 VITALS — BP 126/74 | Ht 64.0 in | Wt 202.6 lb

## 2023-11-24 DIAGNOSIS — Z6834 Body mass index (BMI) 34.0-34.9, adult: Secondary | ICD-10-CM

## 2023-11-24 DIAGNOSIS — R739 Hyperglycemia, unspecified: Secondary | ICD-10-CM

## 2023-11-24 DIAGNOSIS — Z23 Encounter for immunization: Secondary | ICD-10-CM

## 2023-11-24 DIAGNOSIS — E66811 Obesity, class 1: Secondary | ICD-10-CM

## 2023-11-24 DIAGNOSIS — G609 Hereditary and idiopathic neuropathy, unspecified: Secondary | ICD-10-CM | POA: Insufficient documentation

## 2023-11-24 DIAGNOSIS — I10 Essential (primary) hypertension: Secondary | ICD-10-CM | POA: Diagnosis not present

## 2023-11-24 DIAGNOSIS — Z136 Encounter for screening for cardiovascular disorders: Secondary | ICD-10-CM | POA: Diagnosis not present

## 2023-11-24 DIAGNOSIS — K760 Fatty (change of) liver, not elsewhere classified: Secondary | ICD-10-CM

## 2023-11-24 DIAGNOSIS — E785 Hyperlipidemia, unspecified: Secondary | ICD-10-CM | POA: Insufficient documentation

## 2023-11-24 DIAGNOSIS — K219 Gastro-esophageal reflux disease without esophagitis: Secondary | ICD-10-CM

## 2023-11-24 DIAGNOSIS — F419 Anxiety disorder, unspecified: Secondary | ICD-10-CM

## 2023-11-24 DIAGNOSIS — E781 Pure hyperglyceridemia: Secondary | ICD-10-CM

## 2023-11-24 DIAGNOSIS — F32A Depression, unspecified: Secondary | ICD-10-CM

## 2023-11-24 DIAGNOSIS — E6609 Other obesity due to excess calories: Secondary | ICD-10-CM

## 2023-11-24 MED ORDER — WEGOVY 0.25 MG/0.5ML ~~LOC~~ SOAJ: 0.2500 mg | SUBCUTANEOUS | Status: AC

## 2023-11-24 MED ORDER — SEMAGLUTIDE-WEIGHT MANAGEMENT 0.5 MG/0.5ML ~~LOC~~ SOAJ: 0.5000 mg | SUBCUTANEOUS | 0 refills | Status: AC

## 2023-11-24 MED ORDER — ONDANSETRON 4 MG PO TBDP: 4.0000 mg | ORAL_TABLET | Freq: Three times a day (TID) | ORAL | 0 refills | Status: AC | PRN

## 2023-11-24 NOTE — Assessment & Plan Note (Signed)
Stable on escitalopram Support offered

## 2023-11-24 NOTE — Assessment & Plan Note (Signed)
 Encouraged diet and exercise for weight loss We will try Wegovy , sample given in office

## 2023-11-24 NOTE — Addendum Note (Signed)
 Addended by: Arabella Knife D on: 11/24/2023 08:47 AM   Modules accepted: Orders

## 2023-11-24 NOTE — Telephone Encounter (Signed)
 Reneka ST PIERRE (KeySherel Dikes) Rx #: [id number] Wegovy  0.5MG /0.5ML auto-injectors Form [employer] Electronic PA Form [phone] NCPDP) Created 1 hour ago Sent to Plan 1 hour ago Plan Response 1 hour ago Submit Clinical Questions 1 hour ago Determination Favorable 2 minutes ago Your prior authorization for Wegovy  has been approved! More Info Personalized support and financial assistance may be available through the [employer] program. For more information, and to see program requirements, click on the More Info button to the right.  Message from plan: Your PA request has been approved. Additional information will be provided in the approval communication. (Message 1145). Authorization Expiration Date: June 23, 2024.

## 2023-11-24 NOTE — Assessment & Plan Note (Signed)
 Continue folic acid  and omeprazole  She will continue to follow with GI

## 2023-11-24 NOTE — Assessment & Plan Note (Signed)
Controlled off meds We will monitor

## 2023-11-24 NOTE — Progress Notes (Signed)
 Subjective:    Patient ID: Monica Leon, female    DOB: 09/26/1971, 53 y.o.   MRN: [id number]  HPI  Patient presents to clinic today for follow-up of chronic conditions.  HTN: Her BP today is 104/60.  She is not taking any antihypertensive medications at this time.  ECG from 08/2022 reviewed.  Alcoholic fatty liver disease/GERD: Managed with folic acid  and omeprazole  as prescribed.  Upper GI from 07/2022 reviewed.  She is following with GI.  Anxiety and depression: Managed on escitalopram . She is not current seeing a therapist. She denies SI/HI.  HLD: Her last LDL was 83, triglycerides 409, 06/2023.  She is not taking any cholesterol-lowering medication at this time.  She tries to consume a low-fat diet.  Idiopathic neuropathy: Managed with gabapentin  and nortriptyline. She follows with neurology.  She is also interested in starting Wegovy .  Her current weight is 202 pounds with a BMI of 34.78.  She has been trying to increase protein and reduce carbs on her own without much success.  Review of Systems     Past Medical History:  Diagnosis Date   Anxiety    Depression    Fatty liver determined by biopsy    GERD (gastroesophageal reflux disease)    Hypertension    off meds 1 year   Migraine    Muscle atrophy of lower extremity    PONV (postoperative nausea and vomiting)    with ankle surgery   Unintentional weight loss 2023   80 pounds in a year-has no appetite    Current Outpatient Medications  Medication Sig Dispense Refill   acetaminophen  (TYLENOL ) 500 MG tablet Take 1,000 mg by mouth every 6 (six) hours as needed.     escitalopram  (LEXAPRO ) 20 MG tablet TAKE 1 TABLET(20 MG) BY MOUTH EVERY MORNING 90 tablet 1   Ferrous Sulfate (IRON PO) Take 1 tablet by mouth daily.     folic acid  (FOLVITE ) 1 MG tablet TAKE 1 TABLET(1 MG) BY MOUTH DAILY 90 tablet 3   gabapentin  (NEURONTIN ) 100 MG capsule Take 2 capsules (200 mg total) by mouth at bedtime. 180 capsule 0    metoCLOPramide  (REGLAN ) 10 MG tablet Take 1 tablet (10 mg total) by mouth every 8 (eight) hours as needed for nausea. 30 tablet 0   Multiple Vitamin (MULTIVITAMIN) tablet Take 1 tablet by mouth daily.     omeprazole  (PRILOSEC) 40 MG capsule TAKE 1 CAPSULE(40 MG) BY MOUTH DAILY 90 capsule 1   ondansetron  (ZOFRAN -ODT) 4 MG disintegrating tablet Take 1 tablet (4 mg total) by mouth every 8 (eight) hours as needed for nausea or vomiting. 30 tablet 0   No current facility-administered medications for this visit.    No Known Allergies  Family History  Problem Relation Age of Onset   Healthy Mother    Prostate cancer Father    Breast cancer Neg Hx     Social History   Socioeconomic History   Marital status: Significant Other    Spouse name: Larinda Plover   Number of children: Not on file   Years of education: Not on file   Highest education level: Not on file  Occupational History   Not on file  Tobacco Use   Smoking status: Never   Smokeless tobacco: Never  Vaping Use   Vaping status: Never Used  Substance and Sexual Activity   Alcohol use: Not Currently    Alcohol/week: 6.0 standard drinks of alcohol    Types: 6 Standard drinks or equivalent per  week    Comment: 1-2 drinks per day   Drug use: Never   Sexual activity: Yes  Other Topics Concern   Not on file  Social History Narrative   Not on file   Social Drivers of Health   Financial Resource Strain: Low Risk  (10/13/2022)   [facility]   Overall Financial Resource Strain (CARDIA)    Difficulty of Paying Living Expenses: Not hard at all  Food Insecurity: No Food Insecurity (10/13/2022)   [facility]   Hunger Vital Sign    Worried About Running Out of Food in the Last Year: Never true    Ran Out of Food in the Last Year: Never true  Transportation Needs: No Transportation Needs (10/13/2022)   Received  [facility]   [facility] - [facility]    In the past 12 months, has lack of transportation kept you from medical appointments or from getting medications?: No    Lack of Transportation (Non-Medical): No  Physical Activity: Not on file  Stress: Not on file  Social Connections: Not on file  Intimate Partner Violence: Not At Risk (07/26/2022)   Humiliation, Afraid, Rape, and Kick questionnaire    Fear of Current or Ex-Partner: No    Emotionally Abused: No    Physically Abused: No    Sexually Abused: No     Constitutional: Denies fever, malaise, fatigue, headache or abrupt weight changes.  HEENT: Denies eye pain, eye redness, ear pain, ringing in the ears, wax buildup, runny nose, nasal congestion, bloody nose, or sore throat. Respiratory: Denies difficulty breathing, shortness of breath, cough or sputum production.   Cardiovascular: Denies chest pain, chest tightness, palpitations or swelling in the hands or feet.  Gastrointestinal: Denies abdominal pain, bloating, constipation, diarrhea or blood in the stool.  GU: Denies urgency, frequency, pain with urination, burning sensation, blood in urine, odor or discharge. Musculoskeletal: Denies decrease in range of motion, difficulty with gait, muscle pain or joint pain and swelling.  Skin: Denies rashes, lesions or ulcercations.  Neurological: Denies dizziness, difficulty with memory, difficulty with speech or problems with balance and coordination.  Psych: Patient has a history of anxiety and depression.  Denies SI/HI.  No other specific complaints in a complete review of systems (except as listed in HPI above).  Objective:   Physical Exam   BP 126/74 (BP Location: Left Arm, Patient Position: Sitting, Cuff Size: Large)   Ht 5\' 4"  (1.626 m)   Wt 202 lb 9.6 oz (91.9 kg)   BMI 34.78 kg/m    Wt Readings from Last 3 Encounters:  06/25/23 184 lb (83.5 kg)  01/07/23 150 lb (68 kg)   10/27/22 141 lb (64 kg)    General: Appears her stated age, obese, in NAD. Skin: Warm, dry and intact.   HEENT: Head: normal shape and size; Eyes: sclera white, no icterus, conjunctiva pink, PERRLA and EOMs intact;  Cardiovascular: Normal rate and rhythm. S1,S2 noted.  No murmur, rubs or gallops noted. No JVD or BLE edema. No carotid bruits noted. Pulmonary/Chest: Normal effort and positive vesicular breath sounds. No respiratory distress. No wheezes, rales or ronchi noted.  Musculoskeletal: No difficulty with gait. Neurological: Alert and oriented. Coordination normal.  Psychiatric: Mood and affect normal. Behavior is normal. Judgment and thought content normal.    BMET    Component Value Date/Time   NA 140 06/25/2023  1548   K 4.5 06/25/2023 1548   CL 105 06/25/2023 1548   CO2 26 06/25/2023 1548   GLUCOSE 94 06/25/2023 1548   BUN 21 06/25/2023 1548   CREATININE 0.66 06/25/2023 1548   CALCIUM  9.6 06/25/2023 1548   GFRNONAA >60 07/26/2022 1545    Lipid Panel     Component Value Date/Time   CHOL 240 (H) 06/25/2023 1548   TRIG 213 (H) 06/25/2023 1548   HDL 126 06/25/2023 1548   CHOLHDL 1.9 06/25/2023 1548   LDLCALC 83 06/25/2023 1548    CBC    Component Value Date/Time   WBC 5.5 06/25/2023 1548   RBC 3.97 06/25/2023 1548   HGB 12.6 06/25/2023 1548   HCT 37.2 06/25/2023 1548   PLT 172 06/25/2023 1548   MCV 93.7 06/25/2023 1548   MCH 31.7 06/25/2023 1548   MCHC 33.9 06/25/2023 1548   RDW 12.8 06/25/2023 1548    Hgb A1C Lab Results  Component Value Date   HGBA1C 4.9 06/25/2023           Assessment & Plan:     RTC in 6 months for your annual exam Helayne Lo, NP

## 2023-11-24 NOTE — Telephone Encounter (Signed)
 Elah [facility] (KeySherel Dikes) Rx #: [id number] Wegovy  0.5MG /0.5ML auto-injectors Form [employer] Electronic PA Form [phone] NCPDP) Created 24 minutes ago Sent to Plan 4 minutes ago Plan Response 4 minutes ago Submit Clinical Questions less than a minute ago Determination Wait for Determination Please wait for [employer] NCPDP 2017 to return a determination.

## 2023-11-24 NOTE — Assessment & Plan Note (Signed)
 Continue omeprazole  and folic acid  per GI Encourage weight loss as this can reduce reflux symptoms

## 2023-11-24 NOTE — Assessment & Plan Note (Addendum)
 Continue gabapentin  and nortriptyline per neurology

## 2023-11-24 NOTE — Patient Instructions (Signed)
Calorie Counting for Weight Loss Calories are units of energy. Your body needs a certain number of calories from food to keep going throughout the day. When you eat or drink more calories than your body needs, your body stores the extra calories mostly as fat. When you eat or drink fewer calories than your body needs, your body burns fat to get the energy it needs. Calorie counting means keeping track of how many calories you eat and drink each day. Calorie counting can be helpful if you need to lose weight. If you eat fewer calories than your body needs, you should lose weight. Ask your health care provider what a healthy weight is for you. For calorie counting to work, you will need to eat the right number of calories each day to lose a healthy amount of weight per week. A dietitian can help you figure out how many calories you need in a day and will suggest ways to reach your calorie goal. A healthy amount of weight to lose each week is usually 1-2 lb (0.5-0.9 kg). This usually means that your daily calorie intake should be reduced by 500-750 calories. Eating 1,200-1,500 calories a day can help most women lose weight. Eating 1,500-1,800 calories a day can help most men lose weight. What do I need to know about calorie counting? Work with your health care provider or dietitian to determine how many calories you should get each day. To meet your daily calorie goal, you will need to: Find out how many calories are in each food that you would like to eat. Try to do this before you eat. Decide how much of the food you plan to eat. Keep a food log. Do this by writing down what you ate and how many calories it had. To successfully lose weight, it is important to balance calorie counting with a healthy lifestyle that includes regular activity. Where do I find calorie information?  The number of calories in a food can be found on a Nutrition Facts label. If a food does not have a Nutrition Facts label, try  to look up the calories online or ask your dietitian for help. Remember that calories are listed per serving. If you choose to have more than one serving of a food, you will have to multiply the calories per serving by the number of servings you plan to eat. For example, the label on a package of bread might say that a serving size is 1 slice and that there are 90 calories in a serving. If you eat 1 slice, you will have eaten 90 calories. If you eat 2 slices, you will have eaten 180 calories. How do I keep a food log? After each time that you eat, record the following in your food log as soon as possible: What you ate. Be sure to include toppings, sauces, and other extras on the food. How much you ate. This can be measured in cups, ounces, or number of items. How many calories were in each food and drink. The total number of calories in the food you ate. Keep your food log near you, such as in a pocket-sized notebook or on an app or website on your mobile phone. Some programs will calculate calories for you and show you how many calories you have left to meet your daily goal. What are some portion-control tips? Know how many calories are in a serving. This will help you know how many servings you can have of a certain   food. Use a measuring cup to measure serving sizes. You could also try weighing out portions on a kitchen scale. With time, you will be able to estimate serving sizes for some foods. Take time to put servings of different foods on your favorite plates or in your favorite bowls and cups so you know what a serving looks like. Try not to eat straight from a food's packaging, such as from a bag or box. Eating straight from the package makes it hard to see how much you are eating and can lead to overeating. Put the amount you would like to eat in a cup or on a plate to make sure you are eating the right portion. Use smaller plates, glasses, and bowls for smaller portions and to prevent  overeating. Try not to multitask. For example, avoid watching TV or using your computer while eating. If it is time to eat, sit down at a table and enjoy your food. This will help you recognize when you are full. It will also help you be more mindful of what and how much you are eating. What are tips for following this plan? Reading food labels Check the calorie count compared with the serving size. The serving size may be smaller than what you are used to eating. Check the source of the calories. Try to choose foods that are high in protein, fiber, and vitamins, and low in saturated fat, trans fat, and sodium. Shopping Read nutrition labels while you shop. This will help you make healthy decisions about which foods to buy. Pay attention to nutrition labels for low-fat or fat-free foods. These foods sometimes have the same number of calories or more calories than the full-fat versions. They also often have added sugar, starch, or salt to make up for flavor that was removed with the fat. Make a grocery list of lower-calorie foods and stick to it. Cooking Try to cook your favorite foods in a healthier way. For example, try baking instead of frying. Use low-fat dairy products. Meal planning Use more fruits and vegetables. One-half of your plate should be fruits and vegetables. Include lean proteins, such as chicken, turkey, and fish. Lifestyle Each week, aim to do one of the following: 150 minutes of moderate exercise, such as walking. 75 minutes of vigorous exercise, such as running. General information Know how many calories are in the foods you eat most often. This will help you calculate calorie counts faster. Find a way of tracking calories that works for you. Get creative. Try different apps or programs if writing down calories does not work for you. What foods should I eat?  Eat nutritious foods. It is better to have a nutritious, high-calorie food, such as an avocado, than a food with  few nutrients, such as a bag of potato chips. Use your calories on foods and drinks that will fill you up and will not leave you hungry soon after eating. Examples of foods that fill you up are nuts and nut butters, vegetables, lean proteins, and high-fiber foods such as whole grains. High-fiber foods are foods with more than 5 g of fiber per serving. Pay attention to calories in drinks. Low-calorie drinks include water and unsweetened drinks. The items listed above may not be a complete list of foods and beverages you can eat. Contact a dietitian for more information. What foods should I limit? Limit foods or drinks that are not good sources of vitamins, minerals, or protein or that are high in unhealthy fats. These   include: Candy. Other sweets. Sodas, specialty coffee drinks, alcohol, and juice. The items listed above may not be a complete list of foods and beverages you should avoid. Contact a dietitian for more information. How do I count calories when eating out? Pay attention to portions. Often, portions are much larger when eating out. Try these tips to keep portions smaller: Consider sharing a meal instead of getting your own. If you get your own meal, eat only half of it. Before you start eating, ask for a container and put half of your meal into it. When available, consider ordering smaller portions from the menu instead of full portions. Pay attention to your food and drink choices. Knowing the way food is cooked and what is included with the meal can help you eat fewer calories. If calories are listed on the menu, choose the lower-calorie options. Choose dishes that include vegetables, fruits, whole grains, low-fat dairy products, and lean proteins. Choose items that are boiled, broiled, grilled, or steamed. Avoid items that are buttered, battered, fried, or served with cream sauce. Items labeled as crispy are usually fried, unless stated otherwise. Choose water, low-fat milk,  unsweetened iced tea, or other drinks without added sugar. If you want an alcoholic beverage, choose a lower-calorie option, such as a glass of wine or light beer. Ask for dressings, sauces, and syrups on the side. These are usually high in calories, so you should limit the amount you eat. If you want a salad, choose a garden salad and ask for grilled meats. Avoid extra toppings such as bacon, cheese, or fried items. Ask for the dressing on the side, or ask for olive oil and vinegar or lemon to use as dressing. Estimate how many servings of a food you are given. Knowing serving sizes will help you be aware of how much food you are eating at restaurants. Where to find more information Centers for Disease Control and Prevention: www.cdc.gov U.[place]. Department of Agriculture: myplate.gov Summary Calorie counting means keeping track of how many calories you eat and drink each day. If you eat fewer calories than your body needs, you should lose weight. A healthy amount of weight to lose per week is usually 1-2 lb (0.5-0.9 kg). This usually means reducing your daily calorie intake by 500-750 calories. The number of calories in a food can be found on a Nutrition Facts label. If a food does not have a Nutrition Facts label, try to look up the calories online or ask your dietitian for help. Use smaller plates, glasses, and bowls for smaller portions and to prevent overeating. Use your calories on foods and drinks that will fill you up and not leave you hungry shortly after a meal. This information is not intended to replace advice given to you by your health care provider. Make sure you discuss any questions you have with your health care provider. Document Revised: 12/07/2019 Document Reviewed: 12/07/2019 Elsevier Patient Education  2023 Elsevier Inc.  

## 2023-11-24 NOTE — Assessment & Plan Note (Addendum)
 Lipid profile today Encouraged her to consume a low fat diet

## 2023-11-25 ENCOUNTER — Encounter: Payer: Self-pay | Admitting: Internal Medicine

## 2023-11-25 DIAGNOSIS — E782 Mixed hyperlipidemia: Secondary | ICD-10-CM

## 2023-11-25 LAB — COMPLETE METABOLIC PANEL WITH GFR
AG Ratio: 1.6 (calc) (ref 1.0–2.5)
ALT: 22 U/L (ref 6–29)
AST: 25 U/L (ref 10–35)
Albumin: 4.1 g/dL (ref 3.6–5.1)
Alkaline phosphatase (APISO): 93 U/L (ref 37–153)
BUN: 7 mg/dL (ref 7–25)
CO2: 27 mmol/L (ref 20–32)
Calcium: 9.8 mg/dL (ref 8.6–10.4)
Chloride: 103 mmol/L (ref 98–110)
Creat: 0.62 mg/dL (ref 0.50–1.03)
Globulin: 2.5 g/dL (ref 1.9–3.7)
Glucose, Bld: 85 mg/dL (ref 65–99)
Potassium: 4.2 mmol/L (ref 3.5–5.3)
Sodium: 138 mmol/L (ref 135–146)
Total Bilirubin: 0.8 mg/dL (ref 0.2–1.2)
Total Protein: 6.6 g/dL (ref 6.1–8.1)
eGFR: 107 mL/min/{1.73_m2} (ref >=60)

## 2023-11-25 LAB — CBC
HCT: 42.7 % (ref 35.0–45.0)
Hemoglobin: 14.5 g/dL (ref 11.7–15.5)
MCH: 33.3 pg — ABNORMAL HIGH (ref 27.0–33.0)
MCHC: 34 g/dL (ref 32.0–36.0)
MCV: 98.2 fL (ref 80.0–100.0)
MPV: 10.8 fL (ref 7.5–12.5)
Platelets: 216 10*3/uL (ref 140–400)
RBC: 4.35 10*6/uL (ref 3.80–5.10)
RDW: 10.9 % — ABNORMAL LOW (ref 11.0–15.0)
WBC: 4.6 10*3/uL (ref 3.8–10.8)

## 2023-11-25 LAB — LIPID PANEL
Cholesterol: 254 mg/dL — ABNORMAL HIGH (ref <200)
HDL: 79 mg/dL (ref >=50)
LDL Cholesterol (Calc): 145 mg/dL — ABNORMAL HIGH
Non-HDL Cholesterol (Calc): 175 mg/dL — ABNORMAL HIGH (ref <130)
Total CHOL/HDL Ratio: 3.2 (calc) (ref <5.0)
Triglycerides: 164 mg/dL — ABNORMAL HIGH (ref <150)

## 2023-11-25 LAB — HEMOGLOBIN A1C
Hgb A1c MFr Bld: 5 %{Hb} (ref <5.7)
Mean Plasma Glucose: 97 mg/dL
eAG (mmol/L): 5.4 mmol/L

## 2023-11-25 MED ORDER — ATORVASTATIN CALCIUM 10 MG PO TABS: 10.0000 mg | ORAL_TABLET | Freq: Every day | ORAL | 1 refills | Status: DC

## 2023-12-31 ENCOUNTER — Ambulatory Visit: Payer: Self-pay | Admitting: Internal Medicine

## 2024-01-15 ENCOUNTER — Other Ambulatory Visit: Payer: Self-pay | Admitting: Internal Medicine

## 2024-01-17 NOTE — Telephone Encounter (Signed)
 Requested Prescriptions  Pending Prescriptions Disp Refills   omeprazole (PRILOSEC) 40 MG capsule [Pharmacy Med Name: OMEPRAZOLE 40MG  CAPSULES] 90 capsule 0    Sig: TAKE 1 CAPSULE(40 MG) BY MOUTH DAILY     Gastroenterology: Proton Pump Inhibitors Passed - 01/17/2024  2:19 PM      Passed - Valid encounter within last 12 months    Recent Outpatient Visits           1 month ago Gastroesophageal reflux disease without esophagitis   [place] [facility] [place], Salvadore Oxford, NP   6 months ago Encounter for general adult medical examination with abnormal findings   [place] [facility] [place], Salvadore Oxford, NP   1 year ago Primary hypertension   [place] [facility] [place], Salvadore Oxford, NP   1 year ago Closed supracondylar fracture of right humerus with delayed healing, subsequent encounter   [place] [facility] [place], Salvadore Oxford, NP   1 year ago Gastroesophageal reflux disease without esophagitis   [place] [facility] [place], Salvadore Oxford, NP       Future Appointments             In 5 months Baity, Salvadore Oxford, NP  [facility], [facility]

## 2024-02-25 ENCOUNTER — Other Ambulatory Visit: Payer: Self-pay

## 2024-03-18 ENCOUNTER — Other Ambulatory Visit: Payer: Self-pay | Admitting: Internal Medicine

## 2024-03-20 NOTE — Telephone Encounter (Signed)
 Requested Prescriptions  Pending Prescriptions Disp Refills   escitalopram  (LEXAPRO ) 20 MG tablet [Pharmacy Med Name: ESCITALOPRAM  20MG  TABLETS] 90 tablet 0    Sig: TAKE 1 TABLET(20 MG) BY MOUTH EVERY MORNING     Psychiatry:  Antidepressants - SSRI Failed - 03/20/2024  5:30 PM      Failed - Valid encounter within last 6 months    Recent Outpatient Visits   None     Future Appointments             In 3 months Baity, Rankin Buzzard, NP [place] [facility]            Passed - Completed PHQ-2 or PHQ-9 in the last 360 days

## 2024-04-20 ENCOUNTER — Other Ambulatory Visit: Payer: Self-pay | Admitting: Internal Medicine

## 2024-04-21 NOTE — Telephone Encounter (Signed)
 Requested Prescriptions  Pending Prescriptions Disp Refills   omeprazole  (PRILOSEC) 40 MG capsule [Pharmacy Med Name: OMEPRAZOLE  40MG  CAPSULES] 90 capsule 0    Sig: TAKE 1 CAPSULE(40 MG) BY MOUTH DAILY     Gastroenterology: Proton Pump Inhibitors Failed - 04/21/2024 12:22 PM      Failed - Valid encounter within last 12 months    Recent Outpatient Visits   None     Future Appointments             In 2 months Baity, Rankin Buzzard, NP [place] [facility], [facility]

## 2024-06-13 ENCOUNTER — Other Ambulatory Visit (HOSPITAL_COMMUNITY): Payer: Self-pay

## 2024-06-21 ENCOUNTER — Other Ambulatory Visit (HOSPITAL_COMMUNITY): Payer: Self-pay

## 2024-06-22 ENCOUNTER — Other Ambulatory Visit (HOSPITAL_COMMUNITY): Payer: Self-pay

## 2024-06-23 ENCOUNTER — Other Ambulatory Visit: Payer: Self-pay | Admitting: Internal Medicine

## 2024-06-26 ENCOUNTER — Other Ambulatory Visit: Payer: Self-pay

## 2024-06-26 MED ORDER — ESCITALOPRAM OXALATE 20 MG PO TABS: 20.0000 mg | ORAL_TABLET | Freq: Every day | ORAL | 0 refills | Status: AC

## 2024-06-26 MED ORDER — ATORVASTATIN CALCIUM 10 MG PO TABS: 10.0000 mg | ORAL_TABLET | Freq: Every day | ORAL | 1 refills | Status: AC

## 2024-06-26 NOTE — Telephone Encounter (Signed)
 Requested medication (s) are due for refill today: yes  Requested medication (s) are on the active medication list: yes  Last refill:  11/25/23  Future visit scheduled: No}  Notes to clinic:  Unable to refill per protocol, appointment needed.      Requested Prescriptions  Pending Prescriptions Disp Refills   atorvastatin  (LIPITOR) 10 MG tablet [Pharmacy Med Name: ATORVASTATIN  10MG  TABLETS] 90 tablet 1    Sig: TAKE 1 TABLET(10 MG) BY MOUTH DAILY     Cardiovascular:  Antilipid - Statins Failed - 06/26/2024  4:19 PM      Failed - Valid encounter within last 12 months    Recent Outpatient Visits   None            Failed - Lipid Panel in normal range within the last 12 months    Cholesterol  Date Value Ref Range Status  11/24/2023 254 (H) <200 mg/dL Final   LDL Cholesterol (Calc)  Date Value Ref Range Status  11/24/2023 145 (H) mg/dL (calc) Final    Comment:    Reference range: <100 . Desirable range <100 mg/dL for primary prevention;   <70 mg/dL for patients with CHD or diabetic patients  with > or = 2 CHD risk factors. SABRA LDL-C is now calculated using the Martin-Hopkins  calculation, which is a validated novel method providing  better accuracy than the Friedewald equation in the  estimation of LDL-C.  Gladis APPLETHWAITE et al. SANDREA. 7986;689(80): 2061-2068  (http://education.[employer].com/faq/FAQ164)    HDL  Date Value Ref Range Status  11/24/2023 79 > OR = 50 mg/dL Final   Triglycerides  Date Value Ref Range Status  11/24/2023 164 (H) <150 mg/dL Final         Passed - Patient is not pregnant       escitalopram  (LEXAPRO ) 20 MG tablet [Pharmacy Med Name: ESCITALOPRAM  20MG  TABLETS] 90 tablet 0    Sig: TAKE 1 TABLET(20 MG) BY MOUTH EVERY MORNING     Psychiatry:  Antidepressants - SSRI Failed - 06/26/2024  4:19 PM      Failed - Valid encounter within last 6 months    Recent Outpatient Visits   None            Passed - Completed PHQ-2 or PHQ-9 in the last  360 days

## 2024-06-30 ENCOUNTER — Ambulatory Visit: Payer: Self-pay | Admitting: Internal Medicine

## 2024-08-09 ENCOUNTER — Other Ambulatory Visit: Payer: Self-pay | Admitting: Internal Medicine

## 2024-08-10 NOTE — Telephone Encounter (Signed)
 OV 11/24/23- advised follow up 6 months Requested Prescriptions  Pending Prescriptions Disp Refills   omeprazole  (PRILOSEC) 40 MG capsule [Pharmacy Med Name: OMEPRAZOLE  40MG  CAPSULES] 90 capsule 0    Sig: TAKE 1 CAPSULE(40 MG) BY MOUTH DAILY     Gastroenterology: Proton Pump Inhibitors Failed - 08/10/2024  3:27 PM      Failed - Valid encounter within last 12 months    Recent Outpatient Visits   None

## 2024-08-11 ENCOUNTER — Other Ambulatory Visit: Payer: Self-pay | Admitting: Student

## 2024-08-11 DIAGNOSIS — R200 Anesthesia of skin: Secondary | ICD-10-CM

## 2024-08-11 DIAGNOSIS — R29818 Other symptoms and signs involving the nervous system: Secondary | ICD-10-CM

## 2024-08-11 DIAGNOSIS — R519 Headache, unspecified: Secondary | ICD-10-CM

## 2024-08-11 DIAGNOSIS — R4189 Other symptoms and signs involving cognitive functions and awareness: Secondary | ICD-10-CM

## 2024-08-16 ENCOUNTER — Ambulatory Visit
Admission: RE | Admit: 2024-08-16 | Discharge: 2024-08-16 | Disposition: A | Discharge status: Home / Self Care | Source: Ambulatory Visit | Attending: Student | Admitting: Student

## 2024-08-16 DIAGNOSIS — R200 Anesthesia of skin: Secondary | ICD-10-CM | POA: Insufficient documentation

## 2024-08-16 DIAGNOSIS — R29818 Other symptoms and signs involving the nervous system: Secondary | ICD-10-CM | POA: Insufficient documentation

## 2024-08-16 DIAGNOSIS — R519 Headache, unspecified: Secondary | ICD-10-CM | POA: Diagnosis present

## 2024-08-16 DIAGNOSIS — R202 Paresthesia of skin: Secondary | ICD-10-CM | POA: Insufficient documentation

## 2024-08-16 DIAGNOSIS — R4189 Other symptoms and signs involving cognitive functions and awareness: Secondary | ICD-10-CM | POA: Insufficient documentation

## 2024-10-05 ENCOUNTER — Other Ambulatory Visit: Payer: Self-pay | Admitting: Family Medicine

## 2024-10-10 NOTE — Telephone Encounter (Signed)
 Requested medications are due for refill today.  yes  Requested medications are on the active medications list.  yes  Last refill. 06/26/2024 #90 0 rf  Future visit scheduled.   no  Notes to clinic.  Pt is more than 3 months overdue for an ov.    Requested Prescriptions  Pending Prescriptions Disp Refills   escitalopram  (LEXAPRO ) 20 MG tablet [Pharmacy Med Name: ESCITALOPRAM  20MG  TABLETS] 90 tablet 0    Sig: TAKE 1 TABLET(20 MG) BY MOUTH DAILY     Psychiatry:  Antidepressants - SSRI Failed - 10/10/2024 11:27 AM      Failed - Valid encounter within last 6 months    Recent Outpatient Visits   None            Passed - Completed PHQ-2 or PHQ-9 in the last 360 days

## 2024-10-11 ENCOUNTER — Encounter: Payer: Self-pay | Admitting: Emergency Medicine

## 2024-10-11 ENCOUNTER — Emergency Department

## 2024-10-11 ENCOUNTER — Other Ambulatory Visit: Payer: Self-pay

## 2024-10-11 ENCOUNTER — Emergency Department
Admission: EM | Admit: 2024-10-11 | Discharge: 2024-10-11 | Disposition: A | Discharge status: Home / Self Care | Attending: Emergency Medicine | Admitting: Emergency Medicine

## 2024-10-11 DIAGNOSIS — R0789 Other chest pain: Secondary | ICD-10-CM | POA: Insufficient documentation

## 2024-10-11 DIAGNOSIS — I10 Essential (primary) hypertension: Secondary | ICD-10-CM | POA: Diagnosis not present

## 2024-10-11 DIAGNOSIS — R42 Dizziness and giddiness: Secondary | ICD-10-CM | POA: Diagnosis not present

## 2024-10-11 LAB — BASIC METABOLIC PANEL WITH GFR
Anion gap: 11 (ref 5–15)
BUN: 5 mg/dL — ABNORMAL LOW (ref 6–20)
CO2: 26 mmol/L (ref 22–32)
Calcium: 9.5 mg/dL (ref 8.9–10.3)
Chloride: 98 mmol/L (ref 98–111)
Creatinine, Ser: 0.76 mg/dL (ref 0.44–1.00)
GFR, Estimated: >60 mL/min (ref >60)
Glucose, Bld: 99 mg/dL (ref 70–99)
Potassium: 4.1 mmol/L (ref 3.5–5.1)
Sodium: 135 mmol/L (ref 135–145)

## 2024-10-11 LAB — CBC
HCT: 46.3 % — ABNORMAL HIGH (ref 36.0–46.0)
Hemoglobin: 14.8 g/dL (ref 12.0–15.0)
MCH: 32.6 pg (ref 26.0–34.0)
MCHC: 32 g/dL (ref 30.0–36.0)
MCV: 102 fL — ABNORMAL HIGH (ref 80.0–100.0)
Platelets: 155 K/uL (ref 150–400)
RBC: 4.54 MIL/uL (ref 3.87–5.11)
RDW: 12.1 % (ref 11.5–15.5)
WBC: 4.1 K/uL (ref 4.0–10.5)
nRBC: 0 % (ref 0.0–0.2)

## 2024-10-11 LAB — TROPONIN T, HIGH SENSITIVITY
Troponin T High Sensitivity: <15 ng/L (ref 0–19)
Troponin T High Sensitivity: <15 ng/L (ref 0–19)

## 2024-10-11 MED ORDER — KETOROLAC TROMETHAMINE 30 MG/ML IJ SOLN
15.0000 mg | Freq: Once | INTRAMUSCULAR | Status: AC
  Administered 2024-10-11: 15 mg via INTRAVENOUS
  Filled 2024-10-11: qty 1

## 2024-10-11 MED ORDER — IOHEXOL 350 MG/ML SOLN
75.0000 mL | Freq: Once | INTRAVENOUS | Status: AC | PRN
  Administered 2024-10-11: 75 mL via INTRAVENOUS

## 2024-10-11 MED ORDER — LACTATED RINGERS IV BOLUS
1000.0000 mL | Freq: Once | INTRAVENOUS | Status: AC
  Administered 2024-10-11: 1000 mL via INTRAVENOUS

## 2024-10-11 NOTE — ED Notes (Signed)
 Called CCMD to place pt on central monitoring

## 2024-10-11 NOTE — ED Provider Notes (Signed)
 [facility] Provider Note    Event Date/Time   First MD Initiated Contact with Patient 10/11/24 1821     (approximate)   History   Chest Pain and Dizziness   HPI  Monica Leon [address] Monica Leon is a 53 y.o. female who presents to the ED for evaluation of Chest Pain and Dizziness   I reviewed neurology clinic visit from 2 days ago.  History of migraines, peripheral neuropathy, memory difficulty and brain fog, anxiety depression, hypertension.  Roux-en-Y gastric bypass surgery.  Previous alcohol abuse and unintentional weight loss in 2023 where she reported lost 100 pounds.   Patient presents to the ED for evaluation of a really weird episode that occurred this morning with some lingering symptoms this afternoon.  She reports awakening at a normal time and having some generalized weakness, and out of body experience where she felt like everything was off and around her.  She reports left-sided chest discomfort that has been persistent throughout the day today but no shortness of breath.  Does report a productive cough over the past 3 days after what she describes as a cold/URI a week prior to this.   After this unusual episode this morning she laid down and took a long nap, upon awakening this afternoon she reports some persistent chest discomfort, presyncopal dizziness and feeling off so she presents to the ED.  No falls or syncope, no abdominal pain   Physical Exam   Triage Vital Signs: ED Triage Vitals  Encounter Vitals Group     BP 10/11/24 1541 132/76     Girls Systolic BP Percentile --      Girls Diastolic BP Percentile --      Boys Systolic BP Percentile --      Boys Diastolic BP Percentile --      Pulse Rate 10/11/24 1541 (!) 125     Resp 10/11/24 1541 18     Temp 10/11/24 1541 98.4 F (36.9 C)     Temp Source 10/11/24 1541 Oral     SpO2 10/11/24 1541 98 %     Weight 10/11/24 1539 201 lb (91.2 kg)     Height 10/11/24 1539 5' 4 (1.626 m)     Head  Circumference --      Peak Flow --      Pain Score 10/11/24 1538 6     Pain Loc --      Pain Education --      Exclude from Growth Chart --     Most recent vital signs: Vitals:   10/11/24 1934 10/11/24 2040  BP: (!) 150/87 103/87  Pulse: 89 81  Resp: 17 18  Temp: 97.8 F (36.6 C)   SpO2: 96% 100%    General: Awake, no distress.  Pleasant and conversational, generally well-appearing, somewhat anxious CV:  Good peripheral perfusion.  Tachycardic and regular Resp:  Normal effort.  No wheezing Abd:  No distention.  Soft and nontender MSK:  No deformity noted.  Neuro:  No focal deficits appreciated. Other:     ED Results / Procedures / Treatments   Labs (all labs ordered are listed, but only abnormal results are displayed) Labs Reviewed  BASIC METABOLIC PANEL WITH GFR - Abnormal; Notable for the following components:      Result Value   BUN 5 (*)    All other components within normal limits  CBC - Abnormal; Notable for the following components:   HCT 46.3 (*)    MCV 102.0 (*)  All other components within normal limits  TROPONIN T, HIGH SENSITIVITY  TROPONIN T, HIGH SENSITIVITY    EKG Sinus tachycardia with a rate of 123 bpm.  Normal axis and intervals.  No clear signs of acute ischemia.  RADIOLOGY 2 view CXR interpreted by me with faint bilateral upper lobe opacities.  Official radiology report(s): CT Angio Chest PE W and/or Wo Contrast Result Date: 10/11/2024 CLINICAL DATA:  Evaluate for PE. Chest pain and dizziness with cough. EXAM: CT ANGIOGRAPHY CHEST WITH CONTRAST TECHNIQUE: Multidetector CT imaging of the chest was performed using the standard protocol during bolus administration of intravenous contrast. Multiplanar CT image reconstructions and MIPs were obtained to evaluate the vascular anatomy. RADIATION DOSE REDUCTION: This exam was performed according to the departmental dose-optimization program which includes automated exposure control, adjustment of the  mA and/or kV according to patient size and/or use of iterative reconstruction technique. CONTRAST:  75mL OMNIPAQUE  IOHEXOL  350 MG/ML SOLN COMPARISON:  CT chest abdomen and pelvis 07/26/2022. FINDINGS: Cardiovascular: Satisfactory opacification of the pulmonary arteries to the segmental level. No evidence of pulmonary embolism. Normal heart size. No pericardial effusion. Mediastinum/Nodes: No enlarged mediastinal, hilar, or axillary lymph nodes. Thyroid  gland, trachea, and esophagus demonstrate no significant findings. There is a small hiatal hernia. Lungs/Pleura: There is dependent atelectasis in the bilateral lower lobes. The lungs are otherwise clear. There is no pleural effusion or pneumothorax. Upper Abdomen: There are postsurgical changes in the stomach. Musculoskeletal: No chest wall abnormality. No acute or significant osseous findings. Review of the MIP images confirms the above findings. IMPRESSION: 1. No evidence for pulmonary embolism. 2. Small hiatal hernia. Electronically Signed   By: Greig Pique M.D.   On: 10/11/2024 20:35   DG Chest 2 View Result Date: 10/11/2024 CLINICAL DATA:  Chest pain and weakness EXAM: CHEST - 2 VIEW COMPARISON:  Chest x-ray 07/26/2022 FINDINGS: Faint rounded densities overlying the bilateral upper lobes may represent overlying snap artifact. The lungs otherwise appear clear. There is no pleural effusion or pneumothorax. The cardiomediastinal silhouette is within normal limits. No acute fractures are seen. IMPRESSION: Faint rounded densities overlying the bilateral upper lobes may represent overlying snap artifact. Please correlate clinically. Recommend repeat chest x-ray with removal of overlying clothing. Electronically Signed   By: Greig Pique M.D.   On: 10/11/2024 16:51    PROCEDURES and INTERVENTIONS:  .1-3 Lead EKG Interpretation  Performed by: Claudene Rover, MD Authorized by: Claudene Rover, MD     Interpretation: abnormal     ECG rate:  108   ECG rate  assessment: tachycardic     Rhythm: sinus tachycardia     Ectopy: none     Conduction: normal     Medications  lactated ringers  bolus 1,000 mL (0 mLs Intravenous Stopped 10/11/24 2050)  ketorolac  (TORADOL ) 30 MG/ML injection 15 mg (15 mg Intravenous Given 10/11/24 1846)  iohexol  (OMNIPAQUE ) 350 MG/ML injection 75 mL (75 mLs Intravenous Contrast Given 10/11/24 1918)     IMPRESSION / MDM / ASSESSMENT AND PLAN / ED COURSE  I reviewed the triage vital signs and the nursing notes.  Differential diagnosis includes, but is not limited to, ACS, PTX, PNA, muscle strain/spasm, PE, dissection, anxiety, pleural effusion  {Patient presents with symptoms of an acute illness or injury that is potentially life-threatening.  Patient presents to the ED with atypical chest discomfort and dizziness, with a benign workup and suitable for outpatient management.  Presenting tachycardia as noted, otherwise normal vitals on room air.  Looks well without  signs of neurologic deficits.  Normal CBC, metabolic panel and 2 negative troponins.  CXR questions infiltrates, due to this and presenting symptoms, tachycardia, obtain CTA chest to help rule out infiltrates and PE and this is a benign study.  Symptoms resolving with fluids and Toradol .  No barriers to outpatient management at this point.  Discussed ED return precautions.  Clinical Course as of 10/11/24 2142  Wed Oct 11, 2024  2040 Reassessed, patient reports feeling okay, no chest pain, tachycardia resolved.  We discussed reassuring CTA chest without evidence of acute pathology.  Discussed small hiatal hernia.  She is comfortable going home and I think this is reasonable.  We discussed ED return precautions. [DS]    Clinical Course User Index [DS] Claudene Rover, MD     FINAL CLINICAL IMPRESSION(S) / ED DIAGNOSES   Final diagnoses:  Other chest pain  Dizziness     Rx / DC Orders   ED Discharge Orders     None        Note:  This document was  prepared using Dragon voice recognition software and may include unintentional dictation errors.   Claudene Rover, MD 10/11/24 2142

## 2024-10-11 NOTE — ED Triage Notes (Signed)
 Patient c/o dizziness, chest pain and weakness when waking this morning. Patient states she went to sleep and woke around 2:30 with symptoms continued so came here. Patient reports unable to eat x 1 week and having recent weight loss. Patient states she saw her neurologist two days ago for headaches and memory difficulties that have been ongoing for a couple months.

## 2024-10-12 ENCOUNTER — Other Ambulatory Visit: Payer: Self-pay | Admitting: Internal Medicine

## 2024-10-14 NOTE — Telephone Encounter (Signed)
 Requested medications are due for refill today.  yes  Requested medications are on the active medications list.  yes  Last refill. 09/20/2023  Future visit scheduled.   no  Notes to clinic.  It appears that pt is now using a [facility] provider.    Requested Prescriptions  Pending Prescriptions Disp Refills   folic acid  (FOLVITE ) 1 MG tablet [Pharmacy Med Name: FOLIC ACID  1MG  TABLETS] 90 tablet 3    Sig: TAKE 1 TABLET(1 MG) BY MOUTH DAILY     Endocrinology:  Vitamins Failed - 10/14/2024  6:42 PM      Failed - Valid encounter within last 12 months    Recent Outpatient Visits   None

## 2024-10-18 ENCOUNTER — Encounter (HOSPITAL_COMMUNITY): Payer: Self-pay

## 2024-11-10 ENCOUNTER — Other Ambulatory Visit: Payer: Self-pay | Admitting: Family Medicine

## 2024-11-10 DIAGNOSIS — Z1231 Encounter for screening mammogram for malignant neoplasm of breast: Secondary | ICD-10-CM

## 2024-11-18 NOTE — Progress Notes (Unsigned)
 Virtual Visit via Video Note  I connected with Monica Leon on 11/22/2024 at 10:00 AM EST by a video enabled telemedicine application and verified that I am speaking with the correct person using two identifiers.  Location: Patient: car Provider: home office Persons participated in the visit- patient, provider    I discussed the limitations of evaluation and management by telemedicine and the availability of in person appointments. The patient expressed understanding and agreed to proceed.    I discussed the assessment and treatment plan with the patient. The patient was provided an opportunity to ask questions and all were answered. The patient agreed with the plan and demonstrated an understanding of the instructions.   The patient was advised to call back or seek an in-person evaluation if the symptoms worsen or if the condition fails to improve as anticipated.   Katheren Sleet, MD     Psychiatric Initial Adult Assessment   Patient Identification: Monica Leon MRN:  [id number] Date of Evaluation:  11/22/2024 Referral Source: Thomasena Sherran [place], [place]  Chief Complaint:   Chief Complaint  Patient presents with   Establish Care   Visit Diagnosis:    ICD-10-CM   1. GAD (generalized anxiety disorder)  F41.1     2. Panic attack  F41.0       History of Present Illness:   Monica Leon is a 54 y.o.  female with a history of anxiety, alcohol use in remission per chart, migraine, peripheral neuropathy, hypertension, s/p Roux-en Y gastric bypass in 2008, who is referred for anxiety.   She states that she has severe neuropathy.  Gabapentin  was switched to Lyrica due to concern about her memory.  Lexapro , which she used to be on for 20 years was switched to fluoxetine for anxiety.  She had severe anxiety where she cannot function.  She felt like having a heart attack, and she could not think clearly with palpitation.  She feels that it was not enough when she was on Lexapro ,  although it is nowhere severe like this.  She had these episodes twice in December, and once in January.  It tends to happen when she wakes up.  She reports anxiety related to her job.  She is fearful of losing the job, although she was always a star, being best friends with the management.  She reports high stress related to the manager, who is micro managing.  She is also concerned about the brain fog.   Depression-she states very well since being on nortriptyline.  She thinks she is depressed, wondering if is this what I want.  She denies much irritability, stating that she is calm person.  Her mood is usually even keeled.  Her new year resolution is get up and do things.  She reports increase in appetite.  She denies SI, HI, hallucinations.   PTSD-she denies history mistreatment.  She has a history of admission in 2023, where she experienced severe dry heaves, malnutrition, weight loss, muscle atrophy and was on feeding tube.  She had MRSA (MSSA per chart review) infection. She was a day out from dying.  Although she denies nightmares about it, she had some dream with a feeling similar to how she was feeling when she was in the hospital.  She agrees that this makes her feel anxious when she wakes up.   Support -she reports great support from her boyfriend of six years, who she calls as hippie  Family -she reports some stress related to her  family, stating that the holiday was not good.  She states that her daughter is independent since toddler. She does not communicate as much, and drives her nuts.  Although it hurts her, she states that her daughter was very supportive when she underwent appointment for her condition.   Muriel enjoys going out with her friends, festivals.  She also enjoys seeing her sister-in-law.   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) 54 yo, who she describes as independent Employment: production manager, pensions consultant Education:     Substance use  Tobacco Alcohol Other substances/  Current  denies denies  Past  2-3 glasses of wine every day, until 2024, quit since she got sick denies  Past Treatment        Associated Signs/Symptoms: Depression Symptoms:  depressed mood, anxiety, (Hypo) Manic Symptoms:  denies decreased need for sleep, euphoria Anxiety Symptoms:  Panic Symptoms, Psychotic Symptoms:  denies AH, VH paranoia PTSD Symptoms: As above  Past Psychiatric History:  Outpatient:  Psychiatry admission: denies Previous suicide attempt: denies Past trials of medication: lexapro , fluoxetine History of violence:  History of head injury:   Previous Psychotropic Medications: Yes   Substance Abuse History in the last 12 months:  No.  Consequences of Substance Abuse: NA  Past Medical History:  Past Medical History:  Diagnosis Date   Anxiety    Depression    Fatty liver determined by biopsy    GERD (gastroesophageal reflux disease)    Hypertension    off meds 1 year   Migraine    Muscle atrophy of lower extremity    PONV (postoperative nausea and vomiting)    with ankle surgery   Unintentional weight loss 2023   80 pounds in a year-has no appetite    Past Surgical History:  Procedure Laterality Date   ANKLE FRACTURE SURGERY Left 2005   CARDIAC CATHETERIZATION     COLONOSCOPY WITH PROPOFOL  N/A 03/23/2022   Procedure: COLONOSCOPY WITH PROPOFOL ;  Surgeon: Unk Corinn Skiff, MD;  Location: [facility] ENDOSCOPY;  Service: Gastroenterology;  Laterality: N/A;   ESOPHAGOGASTRODUODENOSCOPY N/A 03/23/2022   Procedure: ESOPHAGOGASTRODUODENOSCOPY (EGD);  Surgeon: Unk Corinn Skiff, MD;  Location: [facility] ENDOSCOPY;  Service: Gastroenterology;  Laterality: N/A;    ESOPHAGOGASTRODUODENOSCOPY (EGD) WITH PROPOFOL  N/A 04/29/2022   Procedure: ESOPHAGOGASTRODUODENOSCOPY (EGD) WITH PROPOFOL ;  Surgeon: Unk Corinn Skiff, MD;  Location: [facility] END[facility]COPY;  Service: Gastroenterology;  Laterality: N/A;   ESOPHAGOGASTRODUODENOSCOPY (EGD) WITH PROPOFOL  N/A 07/28/2022   Procedure: ESOPHAGOGASTRODUODENOSCOPY (EGD) WITH PROPOFOL ;  Surgeon: Unk Corinn Skiff, MD;  Location: [facility] ENDOSCOPY;  Service: Gastroenterology;  Laterality: N/A;   GASTRIC BYPASS  2008   HARDWARE REMOVAL Right 01/07/2023   Procedure: HARDWARE REMOVAL;  Surgeon: Edie Norleen PARAS, MD;  Location: [facility] ORS;  Service: Orthopedics;  Laterality: Right;   ORIF HUMERUS FRACTURE Right 08/20/2022   Procedure: OPEN REDUCTION INTERNAL FIXATION (ORIF) PROXIMAL HUMERUS FRACTURE;  Surgeon: Edie Norleen PARAS, MD;  Location: [facility] ORS;  Service: Orthopedics;  Laterality: Right;    Family Psychiatric History: as below  Family History:  Family History  Problem Relation Age of Onset   Healthy Mother    Prostate cancer Father    Breast cancer Neg Hx     Social History:   Social History   Socioeconomic History  Marital status: Significant Other    Spouse name: Medford   Number of children: Not on file   Years of education: Not on file   Highest education level: Not on file  Occupational History   Not on file  Tobacco Use   Smoking status: Never   Smokeless tobacco: Never  Vaping Use   Vaping status: Never Used  Substance and Sexual Activity   Alcohol use: Not Currently    Alcohol/week: 6.0 standard drinks of alcohol    Types: 6 Standard drinks or equivalent per week    Comment: 1-2 drinks per day   Drug use: Never   Sexual activity: Yes  Other Topics Concern   Not on file  Social History Narrative   Not on file   Social Drivers of Health   Tobacco Use: Low Risk (11/22/2024)   Patient History    Smoking Tobacco Use: Never    Smokeless Tobacco Use: Never    Passive Exposure: Not on file  Financial  Resource Strain: Low Risk  (09/07/2024)   Received from [facility]   Overall Financial Resource Strain (CARDIA)    Difficulty of Paying Living Expenses: Not hard at all  Food Insecurity: No Food Insecurity (09/07/2024)   Received from [facility]   Epic    Within the past 12 months, you worried that your food would run out before you got the money to buy more.: Never true    Within the past 12 months, the food you bought just didn't last and you didn't have money to get more.: Never true  Transportation Needs: No Transportation Needs (09/07/2024)   Received from [facility] - Transportation    In the past 12 months, has lack of transportation kept you from medical appointments or from getting medications?: No    Lack of Transportation (Non-Medical): No  Physical Activity: Not on file  Stress: Not on file  Social Connections: Not on file  Depression (PHQ2-9): Low Risk (11/24/2023)   Depression (PHQ2-9)    PHQ-2 Score: 0  Alcohol Screen: Low Risk (06/25/2023)   Alcohol Screen    Last Alcohol Screening Score (AUDIT): 0  Housing: Low Risk  (09/07/2024)   Received from [facility]   Epic    At any time in the past 12 months, were you homeless or living in a shelter (including now)?: No    In the last 12 months, was there a time when you were not able to pay the mortgage or rent on time?: No    In the past 12 months, how many times have you moved where you were living?: 0  Utilities: Not At Risk (09/07/2024)   Received from [facility]   Epic    In the past 12 months has the electric, gas, oil, or water company threatened to shut off services in your home?: No  Health Literacy: Not on file    Additional Social History: as above  Allergies:  Allergies[1]  Metabolic Disorder Labs: Lab Results  Component Value Date   HGBA1C 5.0 11/24/2023   MPG 97 11/24/2023   MPG 94 06/25/2023    No results found for: PROLACTIN Lab Results  Component Value Date   CHOL 254 (H) 11/24/2023   TRIG 164 (H) 11/24/2023   HDL 79 11/24/2023   CHOLHDL 3.2 11/24/2023   LDLCALC 145 (H) 11/24/2023   LDLCALC 83 06/25/2023   Lab Results  Component Value  Date   TSH 0.65 12/23/2021    Therapeutic Level Labs: No results found for: LITHIUM No results found for: CBMZ No results found for: VALPROATE  Current Medications: Current Outpatient Medications  Medication Sig Dispense Refill   DULoxetine  (CYMBALTA ) 30 MG capsule Take 1 capsule (30 mg total) by mouth daily. 14 capsule 0   [START ON 12/06/2024] DULoxetine  (CYMBALTA ) 60 MG capsule Take 1 capsule (60 mg total) by mouth daily. Start after completing for 14 days 30 capsule 1   nortriptyline (PAMELOR) 25 MG capsule Take 25 mg by mouth at bedtime.     acetaminophen  (TYLENOL ) 500 MG tablet Take 1,000 mg by mouth every 6 (six) hours as needed.     atorvastatin  (LIPITOR) 10 MG tablet Take 1 tablet (10 mg total) by mouth daily. 90 tablet 1   escitalopram  (LEXAPRO ) 20 MG tablet Take 1 tablet (20 mg total) by mouth daily. 90 tablet 0   Ferrous Sulfate (IRON PO) Take 1 tablet by mouth daily.     folic acid  (FOLVITE ) 1 MG tablet TAKE 1 TABLET(1 MG) BY MOUTH DAILY 90 tablet 3   gabapentin  (NEURONTIN ) 100 MG capsule Take 2 capsules (200 mg total) by mouth at bedtime. 180 capsule 0   Multiple Vitamin (MULTIVITAMIN) tablet Take 1 tablet by mouth daily.     Multiple Vitamins-Minerals (ALIVE HAIR, SKIN & NAILS) CHEW Chew by mouth.     nortriptyline (PAMELOR) 10 MG capsule Take 10 mg by mouth 2 (two) times daily.     omeprazole  (PRILOSEC) 40 MG capsule TAKE 1 CAPSULE(40 MG) BY MOUTH DAILY 90 capsule 0   ondansetron  (ZOFRAN -ODT) 4 MG disintegrating tablet Take 1 tablet (4 mg total) by mouth every 8 (eight) hours as needed for nausea or vomiting. 30 tablet 0   Semaglutide -Weight Management (WEGOVY ) 0.25 MG/0.5ML SOAJ Inject 0.25 mg into the skin  once a week. (Patient not taking: Reported on 11/22/2024)     Semaglutide -Weight Management 0.5 MG/0.5ML SOAJ Inject 0.5 mg into the skin once a week. (Patient not taking: Reported on 11/22/2024) 6 mL 0   No current facility-administered medications for this visit.    Musculoskeletal: Strength & Muscle Tone: within normal limits Gait & Station: normal Patient leans: N/A  Psychiatric Specialty Exam: Review of Systems  Psychiatric/Behavioral:  Positive for decreased concentration and dysphoric mood. Negative for agitation, behavioral problems, confusion, hallucinations, self-injury, sleep disturbance and suicidal ideas. The patient is nervous/anxious. The patient is not hyperactive.   All other systems reviewed and are negative.   There were no vitals taken for this visit.There is no height or weight on file to calculate BMI.  General Appearance: Well Groomed  Eye Contact:  Good  Speech:  Clear and Coherent  Volume:  Normal  Mood:  Anxious  Affect:  Appropriate, Congruent, and Tearful  Thought Process:  Coherent  Orientation:  Full (Time, Place, and Person)  Thought Content:  Logical  Suicidal Thoughts:  No  Homicidal Thoughts:  No  Memory:  Immediate;   Good  Judgement:  Good  Insight:  Good  Psychomotor Activity:  Normal  Concentration:  Concentration: Good and Attention Span: Good  Recall:  Good  Fund of Knowledge:Good  Language: Good  Akathisia:  No  Handed:  Right  AIMS (if indicated):  not done  Assets:  Communication Skills Desire for Improvement  ADL's:  Intact  Cognition: WNL  Sleep:  Good   Screenings: GAD-7    Flowsheet Row Office Visit from 11/24/2023 in [facility] Office Visit from 06/25/2023 in [facility] Office Visit from 10/27/2022 in [facility] Office Visit from 03/27/2022 in [facility] Office Visit from 02/20/2022 in [facility]  Total GAD-7 Score 0 0 0 0 1   PHQ2-9    Flowsheet Row Office Visit from 11/24/2023 in [facility] Office Visit from 06/25/2023 in [facility] Office Visit from 10/27/2022 in [facility] Office Visit from 03/27/2022 in [facility] Office Visit from 02/20/2022 in [facility]  PHQ-2 Total Score 0 0 2 0 1  PHQ-9 Total Score -- 1 3 4 6    Flowsheet Row Office Visit from 11/22/2024 in [facility] ED from 10/11/2024 in [facility] at [facility] Admission (Discharged) from 01/07/2023 in [facility]  C-SSRS RISK CATEGORY No Risk No Risk No Risk    Assessment and Plan:  Anzleigh Slaven is a 54 y.o.  female with a history of anxiety, alcohol use in remission per chart, migraine, peripheral neuropathy,s/p ORIF of humerus 08/2022,  hypertension, s/p Roux-en Y gastric bypass in 2008, who is referred for anxiety.   1. GAD (generalized anxiety disorder) 2. Panic attack # r/o PTSD She reports possible family history of her mother with anxiety.  She has chronic pain secondary to peripheral neuropathy. She had admission in 2023 after fall, and the course was complicated with MSSA bacteremia, delirium.  She reports significant stress related to work/conflict with the manager, and has occasional conflict with her daughter.  History: Rx from PCP. Originally on fluoxetine 20 mg daily, which was switched from lexapro  20 mg daily which she used to be on for many years  The exam is notable for tearfulness while she talks about the concern related to work.  She reports significant worsening in panic attacks, and has anxiety which has worsened since Lexapro  was switched to fluoxetine.  After discussing treatment options, she would like to proceed with trying duloxetine   at this time which could be also beneficial for peripheral neuropathy.  Discussed potential risk of headache, hypertension.  Discussed risk of serotonin syndrome with concomitant use of nortriptyline.  May consider switching back to Lexapro  if she has limited benefit from this.  It is noted that she reports experiencing episodes of anxiety upon waking, triggered by a sensation that she describes as similar to what she felt during her 2023 admission.  She may benefit from CBT/EMDR; will continue to assess and intervene as needed.   Plan Discontinue fluoxetine (was on 20 mg) Start duloxetine  30 mg daily for 2 weeks, then 60 mg daily Next appointment- 2/23 at 2:30, IP - will plan to complete screening at her next visit - on nortriptyline 25 mg at night for peripheral neuropathy, Pregabalin 50 mg TID - check TSH at her next visit  The patient demonstrates the following risk factors for suicide: Chronic risk factors for suicide include: psychiatric disorder of depression, anxiety and chronic pain. Acute risk factors for suicide include: N/A. Protective factors for this patient include: positive social support, coping skills, and hope for the future. Considering these factors, the overall suicide risk at this point appears to be low. Patient is appropriate for outpatient follow up.   Collaboration of Care: Other reviewed notes in Epic  Patient/Guardian was advised Release of Information must be obtained prior to any record release in order to collaborate their care with an outside provider. Patient/Guardian was advised if they have not already done so to contact the registration department to sign all necessary forms in order for us  to release information regarding their care.   I personally spent a total of 65 minutes in the care of the patient today including preparing to see the patient, getting/reviewing separately obtained history, performing a medically appropriate exam/evaluation, counseling and  educating, placing orders, and documenting clinical information in the EHR.   Consent: Patient/Guardian gives verbal consent for treatment and assignment of benefits for services provided during this visit. Patient/Guardian expressed understanding and agreed to proceed.   Katheren Sleet, MD 1/14/20262:13 PM     [1] No Known Allergies

## 2024-11-19 ENCOUNTER — Other Ambulatory Visit: Payer: Self-pay | Admitting: Internal Medicine

## 2024-11-21 NOTE — Telephone Encounter (Signed)
 Courtesy refill given, appointment needed.   Requested Prescriptions  Pending Prescriptions Disp Refills   omeprazole  (PRILOSEC) 40 MG capsule [Pharmacy Med Name: OMEPRAZOLE  40MG  CAPSULES] 90 capsule 0    Sig: TAKE 1 CAPSULE(40 MG) BY MOUTH DAILY     Gastroenterology: Proton Pump Inhibitors Failed - 11/21/2024  8:59 AM      Failed - Valid encounter within last 12 months    Recent Outpatient Visits   None

## 2024-11-22 ENCOUNTER — Ambulatory Visit: Payer: Self-pay | Admitting: Psychiatry

## 2024-11-22 ENCOUNTER — Ambulatory Visit
Admission: RE | Admit: 2024-11-22 | Discharge: 2024-11-22 | Disposition: A | Discharge status: Home / Self Care | Payer: Worker's Compensation | Source: Ambulatory Visit | Attending: Gastroenterology | Admitting: Gastroenterology

## 2024-11-22 ENCOUNTER — Other Ambulatory Visit: Payer: Self-pay | Admitting: Gastroenterology

## 2024-11-22 ENCOUNTER — Encounter: Payer: Self-pay | Admitting: Psychiatry

## 2024-11-22 ENCOUNTER — Ambulatory Visit
Admission: RE | Admit: 2024-11-22 | Discharge: 2024-11-22 | Disposition: A | Discharge status: Home / Self Care | Payer: Worker's Compensation | Attending: Gastroenterology | Admitting: Gastroenterology

## 2024-11-22 DIAGNOSIS — M25561 Pain in right knee: Secondary | ICD-10-CM | POA: Insufficient documentation

## 2024-11-22 DIAGNOSIS — F410 Panic disorder [episodic paroxysmal anxiety] without agoraphobia: Secondary | ICD-10-CM

## 2024-11-22 DIAGNOSIS — Y990 Civilian activity done for income or pay: Secondary | ICD-10-CM | POA: Insufficient documentation

## 2024-11-22 DIAGNOSIS — W109XXA Fall (on) (from) unspecified stairs and steps, initial encounter: Secondary | ICD-10-CM | POA: Diagnosis not present

## 2024-11-22 DIAGNOSIS — F411 Generalized anxiety disorder: Secondary | ICD-10-CM | POA: Diagnosis not present

## 2024-11-22 MED ORDER — DULOXETINE HCL 60 MG PO CPEP: 60.0000 mg | ORAL_CAPSULE | Freq: Every day | ORAL | 1 refills | Status: AC

## 2024-11-22 MED ORDER — DULOXETINE HCL 30 MG PO CPEP: 30.0000 mg | ORAL_CAPSULE | Freq: Every day | ORAL | 0 refills | Status: AC

## 2024-11-22 NOTE — Addendum Note (Signed)
 Addended by: Aki Abalos on: 11/22/2024 05:26 PM   Modules accepted: Level of Service

## 2024-12-07 ENCOUNTER — Ambulatory Visit
Admission: RE | Admit: 2024-12-07 | Discharge: 2024-12-07 | Disposition: A | Discharge status: Home / Self Care | Source: Ambulatory Visit | Attending: Family Medicine | Admitting: Family Medicine

## 2024-12-07 DIAGNOSIS — Z1231 Encounter for screening mammogram for malignant neoplasm of breast: Secondary | ICD-10-CM | POA: Diagnosis present

## 2025-01-01 ENCOUNTER — Ambulatory Visit: Payer: Self-pay | Admitting: Psychiatry
